# Patient Record
Sex: Male | Born: 1960 | Race: White | Hispanic: No | Marital: Married | Smoking: Current every day smoker
Health system: Southern US, Community
[De-identification: ages and names within clinical notes are randomized; demographics above are authoritative.]

## PROBLEM LIST (undated history)

## (undated) DIAGNOSIS — F419 Anxiety disorder, unspecified: Secondary | ICD-10-CM

## (undated) DIAGNOSIS — I5022 Chronic systolic (congestive) heart failure: Secondary | ICD-10-CM

## (undated) DIAGNOSIS — F32A Depression, unspecified: Secondary | ICD-10-CM

## (undated) DIAGNOSIS — I214 Non-ST elevation (NSTEMI) myocardial infarction: Secondary | ICD-10-CM

## (undated) DIAGNOSIS — I519 Heart disease, unspecified: Secondary | ICD-10-CM

## (undated) DIAGNOSIS — I10 Essential (primary) hypertension: Secondary | ICD-10-CM

## (undated) HISTORY — PX: CARDIAC CATHETERIZATION: SHX172

---

## 2007-01-14 ENCOUNTER — Emergency Department (HOSPITAL_COMMUNITY): Admission: EM | Admit: 2007-01-14 | Discharge: 2007-01-14 | Payer: Self-pay | Admitting: Emergency Medicine

## 2010-12-24 LAB — URINE MICROSCOPIC-ADD ON

## 2010-12-24 LAB — URINALYSIS, ROUTINE W REFLEX MICROSCOPIC
Bilirubin Urine: NEGATIVE
Glucose, UA: NEGATIVE
Ketones, ur: NEGATIVE
Nitrite: NEGATIVE
Protein, ur: NEGATIVE
Specific Gravity, Urine: 1.025
Urobilinogen, UA: 0.2
pH: 5.5

## 2010-12-24 LAB — DIFFERENTIAL
Basophils Absolute: 0.1
Basophils Relative: 1
Eosinophils Absolute: 0.3
Eosinophils Relative: 3
Lymphocytes Relative: 18
Lymphs Abs: 2
Monocytes Absolute: 0.9 — ABNORMAL HIGH
Monocytes Relative: 8
Neutro Abs: 8.1 — ABNORMAL HIGH
Neutrophils Relative %: 71

## 2010-12-24 LAB — BASIC METABOLIC PANEL WITH GFR
BUN: 14
Chloride: 107
Creatinine, Ser: 0.81
GFR calc non Af Amer: >60
Glucose, Bld: 99

## 2010-12-24 LAB — CBC
HCT: 48.1
Hemoglobin: 16.5
MCHC: 34.4
MCV: 87.3
Platelets: 338
RBC: 5.51
RDW: 13.4
WBC: 11.4 — ABNORMAL HIGH

## 2010-12-24 LAB — BASIC METABOLIC PANEL
CO2: 27
Calcium: 9.5
GFR calc Af Amer: >60
Potassium: 4.4
Sodium: 141

## 2021-08-23 ENCOUNTER — Encounter (HOSPITAL_COMMUNITY): Payer: Self-pay | Admitting: *Deleted

## 2021-08-23 ENCOUNTER — Emergency Department (HOSPITAL_COMMUNITY): Payer: No Typology Code available for payment source

## 2021-08-23 ENCOUNTER — Other Ambulatory Visit: Payer: Self-pay

## 2021-08-23 ENCOUNTER — Inpatient Hospital Stay (HOSPITAL_COMMUNITY)
Admission: EM | Admit: 2021-08-23 | Discharge: 2021-08-31 | DRG: 957 | Disposition: A | Discharge status: Rehabilitation Facility | Payer: No Typology Code available for payment source | Attending: Internal Medicine | Admitting: Internal Medicine

## 2021-08-23 ENCOUNTER — Inpatient Hospital Stay (HOSPITAL_COMMUNITY): Payer: Self-pay

## 2021-08-23 DIAGNOSIS — Z955 Presence of coronary angioplasty implant and graft: Secondary | ICD-10-CM | POA: Diagnosis not present

## 2021-08-23 DIAGNOSIS — Z9861 Coronary angioplasty status: Secondary | ICD-10-CM

## 2021-08-23 DIAGNOSIS — E669 Obesity, unspecified: Secondary | ICD-10-CM | POA: Diagnosis not present

## 2021-08-23 DIAGNOSIS — E861 Hypovolemia: Secondary | ICD-10-CM | POA: Diagnosis not present

## 2021-08-23 DIAGNOSIS — S3210XA Unspecified fracture of sacrum, initial encounter for closed fracture: Secondary | ICD-10-CM

## 2021-08-23 DIAGNOSIS — F17210 Nicotine dependence, cigarettes, uncomplicated: Secondary | ICD-10-CM | POA: Diagnosis present

## 2021-08-23 DIAGNOSIS — S3282XA Multiple fractures of pelvis without disruption of pelvic ring, initial encounter for closed fracture: Secondary | ICD-10-CM | POA: Diagnosis present

## 2021-08-23 DIAGNOSIS — I110 Hypertensive heart disease with heart failure: Secondary | ICD-10-CM | POA: Diagnosis present

## 2021-08-23 DIAGNOSIS — Y990 Civilian activity done for income or pay: Secondary | ICD-10-CM | POA: Diagnosis not present

## 2021-08-23 DIAGNOSIS — E785 Hyperlipidemia, unspecified: Secondary | ICD-10-CM | POA: Diagnosis present

## 2021-08-23 DIAGNOSIS — K5900 Constipation, unspecified: Secondary | ICD-10-CM | POA: Diagnosis not present

## 2021-08-23 DIAGNOSIS — J9601 Acute respiratory failure with hypoxia: Secondary | ICD-10-CM | POA: Diagnosis not present

## 2021-08-23 DIAGNOSIS — I25810 Atherosclerosis of coronary artery bypass graft(s) without angina pectoris: Secondary | ICD-10-CM | POA: Diagnosis not present

## 2021-08-23 DIAGNOSIS — I2510 Atherosclerotic heart disease of native coronary artery without angina pectoris: Secondary | ICD-10-CM | POA: Diagnosis present

## 2021-08-23 DIAGNOSIS — Z7982 Long term (current) use of aspirin: Secondary | ICD-10-CM | POA: Diagnosis not present

## 2021-08-23 DIAGNOSIS — E559 Vitamin D deficiency, unspecified: Secondary | ICD-10-CM

## 2021-08-23 DIAGNOSIS — I509 Heart failure, unspecified: Secondary | ICD-10-CM | POA: Diagnosis not present

## 2021-08-23 DIAGNOSIS — I5020 Unspecified systolic (congestive) heart failure: Secondary | ICD-10-CM

## 2021-08-23 DIAGNOSIS — Z23 Encounter for immunization: Secondary | ICD-10-CM

## 2021-08-23 DIAGNOSIS — S42492D Other displaced fracture of lower end of left humerus, subsequent encounter for fracture with routine healing: Secondary | ICD-10-CM | POA: Diagnosis not present

## 2021-08-23 DIAGNOSIS — S42402B Unspecified fracture of lower end of left humerus, initial encounter for open fracture: Secondary | ICD-10-CM | POA: Diagnosis present

## 2021-08-23 DIAGNOSIS — W11XXXA Fall on and from ladder, initial encounter: Secondary | ICD-10-CM | POA: Diagnosis present

## 2021-08-23 DIAGNOSIS — Z79899 Other long term (current) drug therapy: Secondary | ICD-10-CM | POA: Diagnosis not present

## 2021-08-23 DIAGNOSIS — S42402A Unspecified fracture of lower end of left humerus, initial encounter for closed fracture: Secondary | ICD-10-CM

## 2021-08-23 DIAGNOSIS — D72829 Elevated white blood cell count, unspecified: Secondary | ICD-10-CM | POA: Diagnosis present

## 2021-08-23 DIAGNOSIS — M25559 Pain in unspecified hip: Secondary | ICD-10-CM | POA: Diagnosis not present

## 2021-08-23 DIAGNOSIS — F419 Anxiety disorder, unspecified: Secondary | ICD-10-CM | POA: Diagnosis not present

## 2021-08-23 DIAGNOSIS — Z6841 Body Mass Index (BMI) 40.0 and over, adult: Secondary | ICD-10-CM

## 2021-08-23 DIAGNOSIS — E66813 Obesity, class 3: Secondary | ICD-10-CM | POA: Diagnosis present

## 2021-08-23 DIAGNOSIS — I252 Old myocardial infarction: Secondary | ICD-10-CM

## 2021-08-23 DIAGNOSIS — S46322A Laceration of muscle, fascia and tendon of triceps, left arm, initial encounter: Secondary | ICD-10-CM | POA: Diagnosis present

## 2021-08-23 DIAGNOSIS — E872 Acidosis, unspecified: Secondary | ICD-10-CM

## 2021-08-23 DIAGNOSIS — E1169 Type 2 diabetes mellitus with other specified complication: Secondary | ICD-10-CM | POA: Diagnosis not present

## 2021-08-23 DIAGNOSIS — I493 Ventricular premature depolarization: Secondary | ICD-10-CM | POA: Diagnosis present

## 2021-08-23 DIAGNOSIS — I10 Essential (primary) hypertension: Secondary | ICD-10-CM | POA: Diagnosis present

## 2021-08-23 DIAGNOSIS — I5023 Acute on chronic systolic (congestive) heart failure: Secondary | ICD-10-CM | POA: Diagnosis not present

## 2021-08-23 DIAGNOSIS — S3210XD Unspecified fracture of sacrum, subsequent encounter for fracture with routine healing: Secondary | ICD-10-CM | POA: Diagnosis not present

## 2021-08-23 DIAGNOSIS — D62 Acute posthemorrhagic anemia: Secondary | ICD-10-CM | POA: Diagnosis not present

## 2021-08-23 DIAGNOSIS — I5022 Chronic systolic (congestive) heart failure: Secondary | ICD-10-CM | POA: Diagnosis not present

## 2021-08-23 DIAGNOSIS — W19XXXA Unspecified fall, initial encounter: Principal | ICD-10-CM

## 2021-08-23 DIAGNOSIS — Z20822 Contact with and (suspected) exposure to covid-19: Secondary | ICD-10-CM | POA: Diagnosis present

## 2021-08-23 DIAGNOSIS — T1490XA Injury, unspecified, initial encounter: Secondary | ICD-10-CM | POA: Diagnosis not present

## 2021-08-23 DIAGNOSIS — S32599A Other specified fracture of unspecified pubis, initial encounter for closed fracture: Secondary | ICD-10-CM | POA: Diagnosis not present

## 2021-08-23 DIAGNOSIS — I9589 Other hypotension: Secondary | ICD-10-CM | POA: Diagnosis not present

## 2021-08-23 DIAGNOSIS — S32592D Other specified fracture of left pubis, subsequent encounter for fracture with routine healing: Secondary | ICD-10-CM | POA: Diagnosis not present

## 2021-08-23 DIAGNOSIS — S52124A Nondisplaced fracture of head of right radius, initial encounter for closed fracture: Secondary | ICD-10-CM | POA: Diagnosis present

## 2021-08-23 DIAGNOSIS — E119 Type 2 diabetes mellitus without complications: Secondary | ICD-10-CM | POA: Diagnosis present

## 2021-08-23 DIAGNOSIS — K5903 Drug induced constipation: Secondary | ICD-10-CM | POA: Diagnosis not present

## 2021-08-23 DIAGNOSIS — R7989 Other specified abnormal findings of blood chemistry: Secondary | ICD-10-CM | POA: Diagnosis not present

## 2021-08-23 HISTORY — DX: Non-ST elevation (NSTEMI) myocardial infarction: I21.4

## 2021-08-23 LAB — COMPREHENSIVE METABOLIC PANEL
ALT: 33 U/L (ref 0–44)
AST: 40 U/L (ref 15–41)
Albumin: 3.3 g/dL — ABNORMAL LOW (ref 3.5–5.0)
Alkaline Phosphatase: 49 U/L (ref 38–126)
Anion gap: 10 (ref 5–15)
BUN: 21 mg/dL (ref 8–23)
CO2: 22 mmol/L (ref 22–32)
Calcium: 8.7 mg/dL — ABNORMAL LOW (ref 8.9–10.3)
Chloride: 107 mmol/L (ref 98–111)
Creatinine, Ser: 0.81 mg/dL (ref 0.61–1.24)
GFR, Estimated: >60 mL/min (ref >60)
Glucose, Bld: 194 mg/dL — ABNORMAL HIGH (ref 70–99)
Potassium: 4.1 mmol/L (ref 3.5–5.1)
Sodium: 139 mmol/L (ref 135–145)
Total Bilirubin: 0.6 mg/dL (ref 0.3–1.2)
Total Protein: 5.8 g/dL — ABNORMAL LOW (ref 6.5–8.1)

## 2021-08-23 LAB — URINALYSIS, ROUTINE W REFLEX MICROSCOPIC
Bacteria, UA: NONE SEEN
Bilirubin Urine: NEGATIVE
Glucose, UA: NEGATIVE mg/dL
Ketones, ur: NEGATIVE mg/dL
Leukocytes,Ua: NEGATIVE
Nitrite: NEGATIVE
Protein, ur: 30 mg/dL — AB
Specific Gravity, Urine: >1.046 — ABNORMAL HIGH (ref 1.005–1.030)
pH: 5 (ref 5.0–8.0)

## 2021-08-23 LAB — CBC
HCT: 43.3 % (ref 39.0–52.0)
Hemoglobin: 14.7 g/dL (ref 13.0–17.0)
MCH: 32.1 pg (ref 26.0–34.0)
MCHC: 33.9 g/dL (ref 30.0–36.0)
MCV: 94.5 fL (ref 80.0–100.0)
Platelets: 193 10*3/uL (ref 150–400)
RBC: 4.58 MIL/uL (ref 4.22–5.81)
RDW: 12.6 % (ref 11.5–15.5)
WBC: 12.6 10*3/uL — ABNORMAL HIGH (ref 4.0–10.5)
nRBC: 0 % (ref 0.0–0.2)

## 2021-08-23 LAB — HIV ANTIBODY (ROUTINE TESTING W REFLEX): HIV Screen 4th Generation wRfx: NONREACTIVE

## 2021-08-23 LAB — HEMOGLOBIN A1C
Hgb A1c MFr Bld: 7.5 % — ABNORMAL HIGH (ref 4.8–5.6)
Mean Plasma Glucose: 168.55 mg/dL

## 2021-08-23 LAB — SAMPLE TO BLOOD BANK

## 2021-08-23 LAB — I-STAT CHEM 8, ED
BUN: 25 mg/dL — ABNORMAL HIGH (ref 8–23)
Calcium, Ion: 0.95 mmol/L — ABNORMAL LOW (ref 1.15–1.40)
Chloride: 107 mmol/L (ref 98–111)
Creatinine, Ser: 0.7 mg/dL (ref 0.61–1.24)
Glucose, Bld: 195 mg/dL — ABNORMAL HIGH (ref 70–99)
HCT: 42 % (ref 39.0–52.0)
Hemoglobin: 14.3 g/dL (ref 13.0–17.0)
Potassium: 4 mmol/L (ref 3.5–5.1)
Sodium: 138 mmol/L (ref 135–145)
TCO2: 23 mmol/L (ref 22–32)

## 2021-08-23 LAB — RESP PANEL BY RT-PCR (FLU A&B, COVID) ARPGX2
Influenza A by PCR: NEGATIVE
Influenza B by PCR: NEGATIVE
SARS Coronavirus 2 by RT PCR: NEGATIVE

## 2021-08-23 LAB — LACTIC ACID, PLASMA
Lactic Acid, Venous: 2 mmol/L (ref 0.5–1.9)
Lactic Acid, Venous: 1.6 mmol/L (ref 0.5–1.9)

## 2021-08-23 LAB — ETHANOL: Alcohol, Ethyl (B): <10 mg/dL (ref <10)

## 2021-08-23 LAB — PROTIME-INR
INR: 1 (ref 0.8–1.2)
Prothrombin Time: 12.9 seconds (ref 11.4–15.2)

## 2021-08-23 LAB — CK: Total CK: 252 U/L (ref 49–397)

## 2021-08-23 MED ORDER — HYDROMORPHONE HCL 1 MG/ML IJ SOLN
1.0000 mg | Freq: Once | INTRAMUSCULAR | Status: AC
  Administered 2021-08-23: 1 mg via INTRAVENOUS
  Filled 2021-08-23: qty 1

## 2021-08-23 MED ORDER — OXYCODONE-ACETAMINOPHEN 5-325 MG PO TABS
1.0000 | ORAL_TABLET | Freq: Four times a day (QID) | ORAL | Status: DC | PRN
  Administered 2021-08-23 – 2021-08-25 (×6): 2 via ORAL
  Administered 2021-08-25: 1 via ORAL
  Filled 2021-08-23 (×7): qty 2

## 2021-08-23 MED ORDER — SODIUM CHLORIDE 0.9% FLUSH
3.0000 mL | Freq: Two times a day (BID) | INTRAVENOUS | Status: DC
  Administered 2021-08-23 – 2021-08-30 (×11): 3 mL via INTRAVENOUS

## 2021-08-23 MED ORDER — HYDROMORPHONE HCL 1 MG/ML IJ SOLN
0.5000 mg | INTRAMUSCULAR | Status: DC | PRN
  Administered 2021-08-23: 0.5 mg via INTRAVENOUS
  Filled 2021-08-23: qty 0.5

## 2021-08-23 MED ORDER — ASPIRIN 325 MG PO TBEC
325.0000 mg | DELAYED_RELEASE_TABLET | Freq: Every day | ORAL | Status: DC
  Administered 2021-08-23 – 2021-08-30 (×8): 325 mg via ORAL
  Filled 2021-08-23 (×8): qty 1

## 2021-08-23 MED ORDER — CARVEDILOL 25 MG PO TABS
25.0000 mg | ORAL_TABLET | Freq: Two times a day (BID) | ORAL | Status: DC
  Administered 2021-08-23 – 2021-08-24 (×4): 25 mg via ORAL
  Filled 2021-08-23 (×2): qty 1
  Filled 2021-08-23 (×2): qty 2

## 2021-08-23 MED ORDER — SODIUM CHLORIDE 0.9 % IV BOLUS
1000.0000 mL | Freq: Once | INTRAVENOUS | Status: AC
  Administered 2021-08-23: 1000 mL via INTRAVENOUS

## 2021-08-23 MED ORDER — ALBUTEROL SULFATE (2.5 MG/3ML) 0.083% IN NEBU: 2.5000 mg | INHALATION_SOLUTION | RESPIRATORY_TRACT | Status: DC | PRN

## 2021-08-23 MED ORDER — SODIUM CHLORIDE 0.9 % IV SOLN: INTRAVENOUS | Status: DC

## 2021-08-23 MED ORDER — ALBUTEROL SULFATE (2.5 MG/3ML) 0.083% IN NEBU
2.5000 mg | INHALATION_SOLUTION | Freq: Four times a day (QID) | RESPIRATORY_TRACT | Status: DC
  Administered 2021-08-23 (×2): 2.5 mg via RESPIRATORY_TRACT
  Filled 2021-08-23 (×3): qty 3

## 2021-08-23 MED ORDER — FENTANYL CITRATE (PF) 100 MCG/2ML IJ SOLN
INTRAMUSCULAR | Status: AC
  Filled 2021-08-23: qty 2

## 2021-08-23 MED ORDER — ENOXAPARIN SODIUM 80 MG/0.8ML IJ SOSY
70.0000 mg | PREFILLED_SYRINGE | INTRAMUSCULAR | Status: DC
  Administered 2021-08-24: 70 mg via SUBCUTANEOUS
  Filled 2021-08-23 (×4): qty 0.7

## 2021-08-23 MED ORDER — HYDROCODONE-ACETAMINOPHEN 5-325 MG PO TABS
1.0000 | ORAL_TABLET | Freq: Four times a day (QID) | ORAL | Status: DC | PRN
  Administered 2021-08-23: 1 via ORAL
  Filled 2021-08-23: qty 1

## 2021-08-23 MED ORDER — ONDANSETRON HCL 4 MG/2ML IJ SOLN: 4.0000 mg | Freq: Four times a day (QID) | INTRAMUSCULAR | Status: DC | PRN

## 2021-08-23 MED ORDER — FENTANYL CITRATE PF 50 MCG/ML IJ SOSY: 50.0000 ug | PREFILLED_SYRINGE | Freq: Once | INTRAMUSCULAR | Status: DC

## 2021-08-23 MED ORDER — ACETAMINOPHEN 325 MG PO TABS
650.0000 mg | ORAL_TABLET | Freq: Four times a day (QID) | ORAL | Status: DC | PRN
  Administered 2021-08-24: 650 mg via ORAL
  Filled 2021-08-23: qty 2

## 2021-08-23 MED ORDER — ATORVASTATIN CALCIUM 40 MG PO TABS
40.0000 mg | ORAL_TABLET | Freq: Every evening | ORAL | Status: DC
  Administered 2021-08-23 – 2021-08-30 (×8): 40 mg via ORAL
  Filled 2021-08-23 (×7): qty 1

## 2021-08-23 MED ORDER — FENTANYL CITRATE PF 50 MCG/ML IJ SOSY
100.0000 ug | PREFILLED_SYRINGE | Freq: Once | INTRAMUSCULAR | Status: AC
  Administered 2021-08-23: 100 ug via INTRAVENOUS

## 2021-08-23 MED ORDER — ACETAMINOPHEN 650 MG RE SUPP: 650.0000 mg | Freq: Four times a day (QID) | RECTAL | Status: DC | PRN

## 2021-08-23 MED ORDER — HYDROMORPHONE HCL 1 MG/ML IJ SOLN: 1.0000 mg | Freq: Once | INTRAMUSCULAR | Status: DC

## 2021-08-23 MED ORDER — LISINOPRIL 20 MG PO TABS
40.0000 mg | ORAL_TABLET | Freq: Every evening | ORAL | Status: DC
  Administered 2021-08-24: 40 mg via ORAL
  Filled 2021-08-23 (×2): qty 2

## 2021-08-23 MED ORDER — ONDANSETRON HCL 4 MG PO TABS: 4.0000 mg | ORAL_TABLET | Freq: Four times a day (QID) | ORAL | Status: DC | PRN

## 2021-08-23 MED ORDER — IOHEXOL 300 MG/ML  SOLN
100.0000 mL | Freq: Once | INTRAMUSCULAR | Status: AC | PRN
  Administered 2021-08-23: 100 mL via INTRAVENOUS

## 2021-08-23 MED ORDER — TETANUS-DIPHTH-ACELL PERTUSSIS 5-2.5-18.5 LF-MCG/0.5 IM SUSY
0.5000 mL | PREFILLED_SYRINGE | Freq: Once | INTRAMUSCULAR | Status: AC
  Administered 2021-08-23: 0.5 mL via INTRAMUSCULAR
  Filled 2021-08-23: qty 0.5

## 2021-08-23 NOTE — ED Notes (Signed)
Dr. Eudelia Bunch informed of patient's Lactic Acid of 2.0 via epic secure chat.

## 2021-08-23 NOTE — H&P (Signed)
History and Physical    Patient: Mathew Oliver [id number] DOB: 09/19/1960 DOA: 08/23/2021 DOS: the patient was seen and examined on 08/23/2021 PCP: Pcp, No  Patient coming from: Home  Chief Complaint:  Chief Complaint  Patient presents with   Fall   HPI: Mathew Oliver is a 61 y.o. male with medical history significant of hypertension, hyperlipidemia, CAD, heart failure with reduced EF 40 -45% in 2021 who presents after falling from a 25 foot ladder.  He was trying to untangle totes at the time when he lost his balance, and fell possibly 18 -20 feet landing on his left side.  He denies losing consciousness or trauma to his head.  He  immediately endorsed pain out of his left elbow and left hip.  Any kind of movement worsens pain symptoms.  With EMS he was placed in a splint.  Upon admission to the emergency department patient was seen as a level 2 trauma.  Noted to be afebrile with mild tachypnea, and vital signs otherwise stable.  Labs significant for WBC 12.6, BUN 25, creatinine 0.7, and lactic acid 2.  CTs of the head, cervical spine, chest, abdomen, and pelvis were obtained noting concern for pelvic fractures including left superior, inferior pubic rami, and left sacrum urinalysis noted significantly elevated specific gravity.  X-rays of the left elbow noted a communicated fracture.  X-ray of the chest noted cardiomegaly with mild pulmonary vascular congestion without pneumothorax or pulmonary effusions.  Dr. Lynann Bologna of orthopedic surgery was consulted. Patient had been given IV pain medication and 1 L normal saline IV fluids.  Review of Systems: As mentioned in the history of present illness. All other systems reviewed and are negative. Past Medical History:  Diagnosis Date   MI, acute, non ST segment elevation ([place])    Past Surgical History:  Procedure Laterality Date   CARDIAC CATHETERIZATION     Social History:  reports that he has been smoking cigarettes. He does not have  any smokeless tobacco history on file. He reports current alcohol use. He reports that he does not use drugs.  No Known Allergies  No family history on file.  Prior to Admission medications   Medication Sig Start Date End Date Taking? Authorizing Provider  aspirin EC 325 MG tablet Take 325 mg by mouth at bedtime.   Yes [provider]  atorvastatin (LIPITOR) 40 MG tablet Take 40 mg by mouth every evening. 03/04/21  Yes [provider]  carvedilol (COREG) 25 MG tablet Take 25 mg by mouth 2 (two) times daily with a meal.   Yes [provider]  lisinopril (ZESTRIL) 40 MG tablet Take 40 mg by mouth every evening.   Yes [provider]  multivitamin (ONE-A-DAY MEN'S) TABS tablet Take 1 tablet by mouth daily.   Yes [provider]  naproxen sodium (ALEVE) 220 MG tablet Take 440 mg by mouth daily as needed (pain/headache).   Yes [provider]  spironolactone (ALDACTONE) 25 MG tablet Take 25 mg by mouth every evening.   Yes [provider]    Physical Exam: Vitals:   08/23/21 0315 08/23/21 0330 08/23/21 0430 08/23/21 0759  BP: 139/87 (!) 152/83 110/67 121/78  Pulse: 93 93 91 90  Resp: (!) 25 (!) 23 (!) 22 18  Temp:    (!) 97 F (36.1 C)  TempSrc:    Temporal  SpO2: 95% 96% 96% 97%  Weight:      Height:       Exam  Constitutional: Older obese male who appears to be in no acute distress at this time Eyes: PERRL, lids and conjunctivae normal ENMT: Mucous membranes are moist.   Neck: normal, supple, no JVD appreciated Respiratory: Normal respiratory effort without significant wheezes or rhonchi appreciated.  Patient currently on 2 L nasal cannula oxygen with O2 saturations maintained. Cardiovascular: Regular rate and rhythm, no murmurs / rubs / gallops. No extremity edema.   Abdomen: Protuberant abdomen with no tenderness, no masses palpated.  Bowel sounds positive.  Musculoskeletal: no clubbing / cyanosis.  Left elbow in  cast and sling. Skin: Bruising noted of the left chest Neurologic: CN 2-12 grossly intact.  Strength 5/5 in all 4.  Psychiatric: Normal judgment and insight. Alert and oriented x 3. Normal mood.   Data Reviewed:  EKG reveals sinus rhythm with multiple premature ventricular complexes.  Assessment and Plan: Left communication distal humerus, sacrum, and pubic rami fractures secondary to fall from ladder Patient presents after falling from a what he reports as a 25 foot ladder.  Patient sustained a communicated distal humerus fracture, left sacral fracture, and pubic ramus fractures on imaging.  Dr. Lynann Bologna orthopedics have been consulted and discussed case with Dr. Doreatha Martin he will evaluate for surgical intervention. -Admit to a medical telemetry bed -Incentive spirometry -Check CK -Weightbearing as tolerated for the sacral and pubic rami fractures.  Consider discussing need of binder patient unable to ambulate -Normal saline IV fluids at 75 mL/h x1 L. -Hydrocodone/Dilaudid IV as needed for moderate to severe pain -PT/OT to evaluate and treat -Transitions of care consulted -Orthopedics consulted, will follow-up for any further recommendations  Leukocytosis lactic acidosis Acute.  On admission WBC 12.6 with lactic acid 2.  White blood cell count elevated likely secondary to fracture.  No significant concern for infection.  Patient appears to be dehydrated due to elevated BUN to creatinine ratio as well as significantly elevated specific gravity on urinalysis which could be contributing to lactic acidosis. -IV fluids 75 mL/h -Recheck lactic acid level -Recheck CBC tomorrow morning  PVCs While in the ED patient was noted to have multiple PVCs.   -Follow-up telemetry overnight -Correct any electrolyte abnormalities  Heart failure with reduced EF Last echo from 08/2019 noted EF of 40-45% with normal diastolic function.  Patient does not appear acutely decompensated at this time.  Patient  had been scheduled for repeat echocardiogram. -Strict I&O's and daily weights -Check echocardiogram given height of fall, PVCs, and patient was already scheduled for repeat echocardiogram.   Essential hypertension Home medication regimen includes Coreg, 25 mg twice daily with meals, lisinopril 40 mg, and spironolactone 25 mg. -Continue Coreg and lisinopril -Held spironolactone initially as patient appeared to be dehydrated based off elevated specific gravity and elevated BUN to creatinine ratio.  Reassess in a.m. and determine when medically appropriate to resume  CAD s/p PCI Patient with prior history of MI who had a drug-eluting stent placed of the LAD in 2009 and has history of -Continue aspirin, statin, beta-blocker  Hyperlipidemia -Continue atorvastatin  Morbid obesity BMI 40.69 kg/m   Advance Care Planning:   Code Status: Full Code   Consults: Orthopedic  Family Communication: None requested  Severity of Illness: The appropriate patient status for this patient is INPATIENT. Inpatient status is judged to be reasonable and necessary in order to provide the required intensity of service to ensure the patient's safety. The patient's presenting symptoms, physical exam findings, and initial radiographic and laboratory data in the context of their chronic comorbidities  is felt to place them at high risk for further clinical deterioration. Furthermore, it is not anticipated that the patient will be medically stable for discharge from the hospital within 2 midnights of admission.   * I certify that at the point of admission it is my clinical judgment that the patient will require inpatient hospital care spanning beyond 2 midnights from the point of admission due to high intensity of service, high risk for further deterioration and high frequency of surveillance required.*  Author: Norval Morton, MD 08/23/2021 8:25 AM  For on call review [email or web address].

## 2021-08-23 NOTE — Progress Notes (Signed)
Orthopedic Tech Progress Note Patient Details:  Mathew Oliver [birth year] [id number]  Initial long arm splint was taken down by Jason Coop, PA-C to check pt's skin due to concern of it being an open fx. She confirmed it was not open and was okay with me reapplying the existing fiberglass splint as it was still in good condition.   I padded the splint well and reapplied to LUE. Also placed two ice packs on the L arm. Encouraged icing, movement of fingers, and elevation to help with swelling.   Ortho Devices Type of Ortho Device: Post (long arm) splint Ortho Device/Splint Location: LUE Ortho Device/Splint Interventions: Application (*reapplication of existing splint)   Post Interventions Patient Tolerated: Well Instructions Provided: Care of device  Mathew Oliver [name] 08/23/2021, 9:39 AM

## 2021-08-23 NOTE — Progress Notes (Signed)
Orthopedic Tech Progress Note Patient Details:  Mathew Oliver 10-11-1960 [id number]  Ortho Devices Type of Ortho Device: Long arm splint, Shoulder immobilizer Ortho Device/Splint Location: lue Ortho Device/Splint Interventions: Ordered, Application, Adjustment   Post Interventions Patient Tolerated: Well  [name] 08/23/2021, 4:24 AM

## 2021-08-23 NOTE — TOC Initial Note (Addendum)
Transition of Care [facility]) - Initial/Assessment Note    Patient Details  Name: Mathew Oliver MRN: [id number] Date of Birth: 1960/12/19  Transition of Care [facility]) CM/SW Contact:    Lockie Pares, RN Phone Number: 08/23/2021, 1:14 PM  Clinical Narrative:                  61 yo with a cardiac history of MI, due soon for echocardiogram, had a ladder at work slip out from underneath him, sustaining a fall. Marland Kitchen  He is currently admitted for Left humorous fx. He also has Pubic rami and sacral fractures.  Conception Oms from [employer] called and let this RNCM know the claim number [id number].He asked questions regarding patients status, will need patient permission prior to speaking to Mr. Yetta Barre. His claim  number is [id number].for [employer] He has been speaking to the patients wife.  Surgical intervention may be tomorrow or as late as Tuesday. Dr Jena Gauss has been consulted from a orthopedic standpoint.  1400 patient gave me verbal permission to talk to Conception Oms about his condition and "whatever he needs to know" Called Conception Oms., no answer 1413 Conception Oms called back let him know of injuries , surgical intervention, Likely DME and Home Health for assistance. He appreciated the information.  Cm will follow for needs, recommendations, and transitions. Barriers to Discharge: Continued Medical Work up   Patient Goals and CMS Choice        Expected Discharge Plan and Services  Home health DME   Discharge Planning Services: CM Consult   Living arrangements for the past 2 months: Single Family Home                                      Prior Living Arrangements/Services Living arrangements for the past 2 months: Single Family Home Lives with:: Spouse Patient language and need for interpreter reviewed:: Yes        Need for Family Participation in Patient Care: Yes (Comment) Care giver support system in place?: Yes (comment)   Criminal Activity/Legal  Involvement Pertinent to Current Situation/Hospitalization: No - Comment as needed  Activities of Daily Living      Permission Sought/Granted                  Emotional Assessment       Orientation: : Oriented to Self, Oriented to Place, Oriented to  Time Alcohol / Substance Use: Tobacco Use Psych Involvement: No (comment)  Admission diagnosis:  Closed fracture of left distal humerus [S42.402A] Patient Active Problem List   Diagnosis Date Noted   Closed fracture of left distal humerus 08/23/2021   PCP:  Pcp, No Pharmacy:   [id number] - [place], [place] - [address] [place] [address] Phone: [phone] Fax: [phone]     Social Determinants of Health (SDOH) Interventions    Readmission Risk Interventions     No data to display

## 2021-08-23 NOTE — ED Triage Notes (Signed)
Pt arrived as Level 2, fall ~18-61ft from a ladder onto his left side. Deformity to L elbow, abrasion to L ribcage. C-collar in place GCS 15. EMS IV to [facility]

## 2021-08-23 NOTE — ED Notes (Signed)
Ortho PA at bedside, removed splint, cleaned left elbow-- ortho tech replacing splint.

## 2021-08-23 NOTE — Plan of Care (Signed)
  Problem: Nutrition: Goal: Adequate nutrition will be maintained Outcome: Progressing   Problem: Safety: Goal: Ability to remain free from injury will improve Outcome: Progressing   Problem: Skin Integrity: Goal: Risk for impaired skin integrity will decrease Outcome: Progressing   

## 2021-08-23 NOTE — ED Provider Notes (Signed)
[facility] EMERGENCY DEPARTMENT Provider Note  CSN: [id number] Arrival date & time: 08/23/21 0054  Chief Complaint(s) Fall  HPI Mathew Oliver is a 61 y.o. male with a past medical history listed below including prior MI who presents to the emergency department as a level 2 trauma.  Patient fell from 18 to 20 feet above ground.  He was standing on a ladder at work when the ladder became unstable causing him to fall onto his left side.  He denied any loss of consciousness.  Denies any headache or neck pain.  Denies any back pain chest pain or abdominal pain.  He is endorsing mostly left elbow pain and left hip pain.  Pain is severe and worse with movement and palpation.  Patient is not anticoagulated.  He was brought in by EMS who stabilized the left elbow.  Patient was given IV fentanyl.  Patient's initial blood pressures were stable but dropped after fentanyl.  The history is provided by the patient.    Past Medical History Past Medical History:  Diagnosis Date   MI, acute, non ST segment elevation [facility])    Patient Active Problem List   Diagnosis Date Noted   Closed fracture of left distal humerus 08/23/2021   Home Medication(s) Prior to Admission medications   Medication Sig Start Date End Date Taking? Authorizing Provider  atorvastatin (LIPITOR) 40 MG tablet Take 40 mg by mouth daily. 03/04/21   [provider]                                                                                                                                    Allergies Patient has no known allergies.  Review of Systems Review of Systems As noted in HPI  Physical Exam Vital Signs  I have reviewed the triage vital signs BP 110/67   Pulse 91   Temp (!) 97.5 F (36.4 C) (Temporal)   Resp (!) 22   Ht 6' (1.829 m)   Wt 136.1 kg   SpO2 96%   BMI 40.69 kg/m   Physical Exam Constitutional:      General: He is not in acute distress.    Appearance: He is  well-developed. He is not diaphoretic.     Interventions: Cervical collar in place.  HENT:     Head: Normocephalic.     Right Ear: External ear normal.     Left Ear: External ear normal.  Eyes:     General: No scleral icterus.       Right eye: No discharge.        Left eye: No discharge.     Conjunctiva/sclera: Conjunctivae normal.     Pupils: Pupils are equal, round, and reactive to light.  Cardiovascular:     Rate and Rhythm: Regular rhythm.     Pulses:          Radial pulses are 2+ on the right side  and 2+ on the left side.       Dorsalis pedis pulses are 2+ on the right side and 2+ on the left side.     Heart sounds: Normal heart sounds. No murmur heard.    No friction rub. No gallop.  Pulmonary:     Effort: Pulmonary effort is normal. No respiratory distress.     Breath sounds: Normal breath sounds. No stridor.  Chest:    Abdominal:     General: There is no distension.     Palpations: Abdomen is soft.     Tenderness: There is no abdominal tenderness.  Musculoskeletal:     Left upper arm: No tenderness.     Left elbow: Swelling and deformity present. No lacerations. Decreased range of motion. Tenderness present.     Left forearm: No tenderness.     Cervical back: Normal range of motion and neck supple. No bony tenderness.     Thoracic back: No bony tenderness.     Lumbar back: No bony tenderness.     Right hip: Normal strength.     Left hip: Tenderness present. Normal range of motion. Normal strength.     Comments: Clavicle stable. Chest stable to AP/Lat compression. Pelvis stable to Lat compression. No chest or abdominal wall contusion.  Skin:    General: Skin is warm.  Neurological:     Mental Status: He is alert and oriented to person, place, and time.     GCS: GCS eye subscore is 4. GCS verbal subscore is 5. GCS motor subscore is 6.     Comments: Moving all extremities      ED Results and Treatments Labs (all labs ordered are listed, but only abnormal  results are displayed) Labs Reviewed  COMPREHENSIVE METABOLIC PANEL - Abnormal; Notable for the following components:      Result Value   Glucose, Bld 194 (*)    Calcium 8.7 (*)    Total Protein 5.8 (*)    Albumin 3.3 (*)    All other components within normal limits  CBC - Abnormal; Notable for the following components:   WBC 12.6 (*)    All other components within normal limits  LACTIC ACID, PLASMA - Abnormal; Notable for the following components:   Lactic Acid, Venous 2.0 (*)    All other components within normal limits  I-STAT CHEM 8, ED - Abnormal; Notable for the following components:   BUN 25 (*)    Glucose, Bld 195 (*)    Calcium, Ion 0.95 (*)    All other components within normal limits  RESP PANEL BY RT-PCR (FLU A&B, COVID) ARPGX2  ETHANOL  PROTIME-INR  URINALYSIS, ROUTINE W REFLEX MICROSCOPIC  SAMPLE TO BLOOD BANK                                                                                                                         EKG  EKG Interpretation  Date/Time:    Ventricular Rate:  PR Interval:    QRS Duration:   QT Interval:    QTC Calculation:   R Axis:     Text Interpretation:         Radiology DG Pelvis 1-2 Views  Result Date: 08/23/2021 CLINICAL DATA:  Fall EXAM: PELVIS - 1-2 VIEW COMPARISON:  CT earlier today. FINDINGS: Left pubic fracture noted extending into the left superior pubic ramus. Left inferior pubic ramus fracture also seen. The left sacral fracture seen on CT not as well visualized by plain film. Proximal femurs unremarkable. No dislocation. IMPRESSION: Left pubic bone, superior pubic ramus and inferior pubic ramus fractures. Electronically Signed   By: Charlett Nose M.D.   On: 08/23/2021 02:49   DG Humerus Left  Result Date: 08/23/2021 CLINICAL DATA:  Fall EXAM: LEFT HUMERUS - 2+ VIEW COMPARISON:  Left elbow series today FINDINGS: Distal left humeral fracture noted. Significantly displaced fracture fragments. No additional humeral  fracture. IMPRESSION: Comminuted, displaced distal left humeral fracture. Electronically Signed   By: Charlett Nose M.D.   On: 08/23/2021 02:49   DG Elbow Complete Left  Result Date: 08/23/2021 CLINICAL DATA:  Fall from ladder EXAM: LEFT ELBOW - COMPLETE 3+ VIEW COMPARISON:  None Available. FINDINGS: Comminuted displaced distal left humeral fracture. Marked displacement of fracture fragments with possible dislocation of the ulna relative to the distal humeral fragments. Study is limited due to patient condition. IMPRESSION: Comminuted, displaced distal left humeral fracture. Possible dislocation of the ulna relative to the distal humeral fragments. Electronically Signed   By: Charlett Nose M.D.   On: 08/23/2021 02:48   DG Forearm Left  Result Date: 08/23/2021 CLINICAL DATA:  Fall from ladder EXAM: LEFT FOREARM - 2 VIEW COMPARISON:  Left elbow series today FINDINGS: Distal left humeral fracture noted. See elbow series for further discussion. No forearm fracture. IMPRESSION: Distal left humeral fracture. See elbow series for further discussion. Electronically Signed   By: Charlett Nose M.D.   On: 08/23/2021 02:47   CT CHEST ABDOMEN PELVIS W CONTRAST  Result Date: 08/23/2021 CLINICAL DATA:  Polytrauma, blunt.  Fall from ladder. EXAM: CT CHEST, ABDOMEN, AND PELVIS WITH CONTRAST TECHNIQUE: Multidetector CT imaging of the chest, abdomen and pelvis was performed following the standard protocol during bolus administration of intravenous contrast. RADIATION DOSE REDUCTION: This exam was performed according to the departmental dose-optimization program which includes automated exposure control, adjustment of the mA and/or kV according to patient size and/or use of iterative reconstruction technique. CONTRAST:  OMNIPAQUE IOHEXOL 300 MG/ML  SOLN COMPARISON:  None Available. FINDINGS: CT CHEST FINDINGS Cardiovascular: Heart is normal size. Aorta is normal caliber. Coronary artery and aortic calcifications.  Mediastinum/Nodes: No mediastinal, hilar, or axillary adenopathy. Trachea and esophagus are unremarkable. Thyroid unremarkable. Lungs/Pleura: Minimal dependent atelectasis in the lower lobes. No effusions or pneumothorax. Musculoskeletal: Chest wall soft tissues are unremarkable. No acute bony abnormality. CT ABDOMEN PELVIS FINDINGS Hepatobiliary: No hepatic injury or perihepatic hematoma. Diffuse low-density throughout the liver compatible with fatty infiltration. No focal hepatic abnormality. Gallbladder is unremarkable. Pancreas: No focal abnormality or ductal dilatation. Spleen: No splenic injury or perisplenic hematoma. Adrenals/Urinary Tract: No adrenal hemorrhage or renal injury identified. Bladder is unremarkable. Bilateral renal cysts. No hydronephrosis. Stomach/Bowel: Normal appendix. Left colonic diverticulosis. No active diverticulitis. Stomach and small bowel unremarkable. Vascular/Lymphatic: Aortic atherosclerosis. No evidence of aneurysm or adenopathy. Reproductive: No visible focal abnormality. Other: No free fluid or free air. Musculoskeletal: Fractures through the left superior and inferior pubic rami. No proximal femoral fracture.  Fracture through the left sacral ala. IMPRESSION: Pelvic fractures including left superior and inferior pubic rami and left sacrum. Otherwise, no evidence of significant trauma or acute findings in the chest, abdomen or pelvis. Hepatic steatosis. Coronary artery disease, aortic atherosclerosis. Electronically Signed   By: Charlett NoseKevin  Dover M.D.   On: 08/23/2021 02:28   CT HEAD WO CONTRAST  Result Date: 08/23/2021 CLINICAL DATA:  Trauma. EXAM: CT HEAD WITHOUT CONTRAST CT CERVICAL SPINE WITHOUT CONTRAST TECHNIQUE: Multidetector CT imaging of the head and cervical spine was performed following the standard protocol without intravenous contrast. Multiplanar CT image reconstructions of the cervical spine were also generated. RADIATION DOSE REDUCTION: This exam was performed  according to the departmental dose-optimization program which includes automated exposure control, adjustment of the mA and/or kV according to patient size and/or use of iterative reconstruction technique. COMPARISON:  None Available. FINDINGS: CT HEAD FINDINGS Brain: The ventricles and sulci are appropriate size for the patient's age. The gray-white matter discrimination is preserved. There is no acute intracranial hemorrhage. No mass effect or midline shift. No extra-axial fluid collection. Vascular: No hyperdense vessel or unexpected calcification. Skull: Normal. Negative for fracture or focal lesion. Sinuses/Orbits: No acute finding. Other: None CT CERVICAL SPINE FINDINGS Alignment: No acute subluxation. Skull base and vertebrae: No acute fracture. Soft tissues and spinal canal: No prevertebral fluid or swelling. No visible canal hematoma. Disc levels: Multilevel degenerative changes with disc space narrowing and endplate irregularity and spurring/osteophyte. Upper chest: Negative. Other: Mild bilateral carotid bulb calcified plaques. IMPRESSION: 1. No acute intracranial abnormality. 2. No acute/traumatic cervical spine pathology. Multilevel degenerative changes. Electronically Signed   By: Elgie CollardArash  Radparvar M.D.   On: 08/23/2021 02:27   CT CERVICAL SPINE WO CONTRAST  Result Date: 08/23/2021 CLINICAL DATA:  Trauma. EXAM: CT HEAD WITHOUT CONTRAST CT CERVICAL SPINE WITHOUT CONTRAST TECHNIQUE: Multidetector CT imaging of the head and cervical spine was performed following the standard protocol without intravenous contrast. Multiplanar CT image reconstructions of the cervical spine were also generated. RADIATION DOSE REDUCTION: This exam was performed according to the departmental dose-optimization program which includes automated exposure control, adjustment of the mA and/or kV according to patient size and/or use of iterative reconstruction technique. COMPARISON:  None Available. FINDINGS: CT HEAD FINDINGS  Brain: The ventricles and sulci are appropriate size for the patient's age. The gray-white matter discrimination is preserved. There is no acute intracranial hemorrhage. No mass effect or midline shift. No extra-axial fluid collection. Vascular: No hyperdense vessel or unexpected calcification. Skull: Normal. Negative for fracture or focal lesion. Sinuses/Orbits: No acute finding. Other: None CT CERVICAL SPINE FINDINGS Alignment: No acute subluxation. Skull base and vertebrae: No acute fracture. Soft tissues and spinal canal: No prevertebral fluid or swelling. No visible canal hematoma. Disc levels: Multilevel degenerative changes with disc space narrowing and endplate irregularity and spurring/osteophyte. Upper chest: Negative. Other: Mild bilateral carotid bulb calcified plaques. IMPRESSION: 1. No acute intracranial abnormality. 2. No acute/traumatic cervical spine pathology. Multilevel degenerative changes. Electronically Signed   By: Elgie CollardArash  Radparvar M.D.   On: 08/23/2021 02:27   DG Chest Port 1 View  Result Date: 08/23/2021 CLINICAL DATA:  Fall EXAM: PORTABLE CHEST 1 VIEW COMPARISON:  None Available. FINDINGS: Evaluation is limited due to positioning and superimposition the trauma board. Shallow inspiration. No focal consolidation, pleural effusion, pneumothorax. Mild cardiomegaly with mild vascular congestion. There is apparent widening of the mediastinum which may be positional. Attention on CT recommended. No obvious acute osseous pathology. IMPRESSION: Mild cardiomegaly with mild central  vascular congestion. Electronically Signed   By: Elgie Collard M.D.   On: 08/23/2021 01:36    Pertinent labs & imaging results that were available during my care of the patient were reviewed by me and considered in my medical decision making (see MDM for details).  Medications Ordered in ED Medications  fentaNYL (SUBLIMAZE) 100 MCG/2ML injection (  Canceled Entry 08/23/21 0216)  HYDROmorphone (DILAUDID)  injection 1 mg (has no administration in time range)  iohexol (OMNIPAQUE) 300 MG/ML solution 100 mL (100 mLs Intravenous Contrast Given 08/23/21 0219)  sodium chloride 0.9 % bolus 1,000 mL (0 mLs Intravenous Stopped 08/23/21 0259)  fentaNYL (SUBLIMAZE) injection 100 mcg (100 mcg Intravenous Given by Other 08/23/21 0145)  HYDROmorphone (DILAUDID) injection 1 mg (1 mg Intravenous Given 08/23/21 0342)                                                                                                                                     Procedures .Splint Application  Date/Time: 08/23/2021 5:22 AM  Performed by: Nira Conn, MD Authorized by: Nira Conn, MD   Consent:    Consent obtained:  Verbal   Consent given by:  Patient   Risks discussed:  Discoloration, numbness, pain and swelling   Alternatives discussed:  No treatment Universal protocol:    Imaging studies available: yes     Patient identity confirmed:  Verbally with patient and arm band Pre-procedure details:    Distal neurologic exam:  Normal   Distal perfusion: distal pulses strong   Procedure details:    Location:  Elbow   Elbow location:  L elbow   Cast type:  Long arm   Supplies:  Fiberglass Post-procedure details:    Distal neurologic exam:  Unchanged   Distal perfusion: distal pulses strong     Procedure completion:  Tolerated   (including critical care time)  Medical Decision Making / ED Course    Complexity of Problem:  Co-morbidities/SDOH that complicate the patient evaluation/care: Noted above in HPI  Additional history obtained: EMS  Patient's presenting problem/concern, DDX, and MDM listed below: Level 2 trauma.  Fall from approximately 20 feet ABCs intact Secondary as above Given mechanism and distracting injuries, will obtain full trauma work-up  Hospitalization Considered:  Yes  Initial Intervention:  IV fluids and fentanyl    Complexity of Data:   Cardiac  Monitoring: The patient was maintained on a cardiac monitor.   I personally viewed and interpreted the cardiac monitored which showed an underlying rhythm of normal sinus rhythm with rates in the 80s with intermittent PVCs EKG confirmed without acute ischemic changes,   Laboratory Tests ordered listed below with my independent interpretation: CBC with mild leukocytosis.  No anemia. No significant electrolyte derangements or renal sufficiency.   Imaging Studies ordered listed below with my independent interpretation: Chest x-ray with cardiomegaly and mild pulmonary vascular congestion without evidence of pneumothorax or pleural effusions.  No obvious rib fractures. Plain film of the pelvis notable for likely pubic ramus fracture on the left. will better characterize with CT scan CT chest abdomen pelvis confirmed the pelvic fractures and also noted a sacral fracture X-ray of the left elbow notable for comminuted fracture.     ED Course:    Assessment, Add'l Intervention, and Reassessment: Fall from 20 feet Resulting in left elbow fracture/dislocation as well as pubic rami and sacral fracture. Splint applied No other injuries noted on work-up. Consulted Dr. Yevette Edwards from orthopedic surgery who requested patient have a CT scan of the left elbow for preop evaluation. I spoke with Dr. Loney Loh from the hospitalist service who agreed to admit patient for medical clearance    Final Clinical Impression(s) / ED Diagnoses Final diagnoses:  Fall, initial encounter  Closed fracture of left elbow, initial encounter           This chart was dictated using voice recognition software.  Despite best efforts to proofread,  errors can occur which can change the documentation meaning.    Nira Conn, MD 08/23/21 [phone]

## 2021-08-23 NOTE — Progress Notes (Signed)
Ortho Trauma Note  Consult received from Dr. Yevette Edwards. Patient will require ORIF of distal humerus. Will tentatively plan for surgery Monday 6/12 pending OR availability. Will recommend NPO after midnight Sunday night. Would recommend mobilization for pelvic fractures with therapy in interim.  Roby Lofts, MD [facility] [phone] (office) [email or web address]

## 2021-08-23 NOTE — Consult Note (Addendum)
Reason for Consult: L elbow px/fx Referring Physician: ED  Mathew Oliver is an 61 y.o. male.  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  This resulted in 18-20 foot fall landing on his left side.  He had immediate pain in his left elbow and left hip.  He was taken to the emergency department and found to have complex left distal humerus fracture as well as pubic rami and sacral fractures.  Orthopedics was consulted for the elbow.  Patient denies any numbness, tingling, change in temperature, or loss of sensation in the left arm or hand.  He confirms prominent pain about the left elbow increased with any movement.  A long-arm splint was placed early this morning and he has found some benefit with that.  He still is having some difficulty with pain control and is only written for 0.5 of Dilaudid when necessary which he states helps substantially but only lasted short period of time.  No history of elbow trauma.  He does have a cardiac history and was actually due to see his cardiologist for an echocardiogram on the 20th.  Denies diabetes positive for smoking.  He takes full strength aspirin daily.  Past Medical History:  Diagnosis Date   MI, acute, non ST segment elevation ([place])     Past Surgical History:  Procedure Laterality Date   CARDIAC CATHETERIZATION      No family history on file.  Social History:  reports that he has been smoking cigarettes. He does not have any smokeless tobacco history on file. He reports current alcohol use. He reports that he does not use drugs.  Allergies: No Known Allergies  Medications: Scheduled:  albuterol  2.5 mg Nebulization Q6H   aspirin EC  325 mg Oral QHS   atorvastatin  40 mg Oral QPM   carvedilol  25 mg Oral BID WC   enoxaparin (LOVENOX) injection  70 mg Subcutaneous Q24H   fentaNYL       lisinopril  40 mg Oral QPM   sodium chloride flush  3 mL Intravenous Q12H   Tdap  0.5 mL Intramuscular Once    Continuous:  sodium chloride     [id number] **OR** acetaminophen, fentaNYL, HYDROcodone-acetaminophen, HYDROmorphone (DILAUDID) injection, ondansetron **OR** ondansetron (ZOFRAN) IV  Results for orders placed or performed during the hospital encounter of 08/23/21 (from the past 48 hour(s))  Sample to Blood Bank     Status: None   Collection Time: 08/23/21  1:08 AM  Result Value Ref Range   Blood Bank Specimen SAMPLE AVAILABLE FOR TESTING    Sample Expiration      08/24/2021,2359 Performed at [facility], [address] [place], [place] [address]   Comprehensive metabolic panel     Status: Abnormal   Collection Time: 08/23/21  1:14 AM  Result Value Ref Range   Sodium 139 135 - 145 mmol/L   Potassium 4.1 3.5 - 5.1 mmol/L   Chloride 107 98 - 111 mmol/L   CO2 22 22 - 32 mmol/L   Glucose, Bld 194 (H) 70 - 99 mg/dL    Comment: Glucose reference range applies only to samples taken after fasting for at least 8 hours.   BUN 21 8 - 23 mg/dL   Creatinine, Ser 0.81 0.61 - 1.24 mg/dL   Calcium 8.7 (L) 8.9 - 10.3 mg/dL   Total Protein 5.8 (L) 6.5 - 8.1 g/dL   Albumin 3.3 (L) 3.5 - 5.0  g/dL   AST 40 15 - 41 U/L   ALT 33 0 - 44 U/L   Alkaline Phosphatase 49 38 - 126 U/L   Total Bilirubin 0.6 0.3 - 1.2 mg/dL   GFR, Estimated >60 >60 mL/min    Comment: (NOTE) Calculated using the CKD-EPI Creatinine Equation (2021)    Anion gap 10 5 - 15    Comment: Performed at [facility] [address] [place], [place] [address]  CBC     Status: Abnormal   Collection Time: 08/23/21  1:14 AM  Result Value Ref Range   WBC 12.6 (H) 4.0 - 10.5 K/uL   RBC 4.58 4.22 - 5.81 MIL/uL   Hemoglobin 14.7 13.0 - 17.0 g/dL   HCT 43.3 39.0 - 52.0 %   MCV 94.5 80.0 - 100.0 fL   MCH 32.1 26.0 - 34.0 pg   MCHC 33.9 30.0 - 36.0 g/dL   RDW 12.6 11.5 - 15.5 %   Platelets 193 150 - 400 K/uL   nRBC 0.0 0.0 - 0.2 %    Comment: Performed at [facility], [place] [address] [place], [place]  [address]  Ethanol     Status: None   Collection Time: 08/23/21  1:14 AM  Result Value Ref Range   Alcohol, Ethyl (B) <10 <10 mg/dL    Comment: (NOTE) Lowest detectable limit for serum alcohol is 10 mg/dL.  For medical purposes only. Performed at [facility], [place] [address] [place], [place] [address]   Lactic acid, plasma     Status: Abnormal   Collection Time: 08/23/21  1:14 AM  Result Value Ref Range   Lactic Acid, Venous 2.0 (HH) 0.5 - 1.9 mmol/L    Comment: CRITICAL RESULT CALLED TO, READ BACK BY AND VERIFIED WITH:  Dennie Maizes, RN, [phone], 08/23/21, EADEDOKUN Performed at [facility], [place] [address] [place], [place] [address]   Protime-INR     Status: None   Collection Time: 08/23/21  1:14 AM  Result Value Ref Range   Prothrombin Time 12.9 11.4 - 15.2 seconds   INR 1.0 0.8 - 1.2    Comment: (NOTE) INR goal varies based on device and disease states. Performed at [facility], [place] [address], [place], [place] [address]   I-Stat Chem 8, ED     Status: Abnormal   Collection Time: 08/23/21  1:22 AM  Result Value Ref Range   Sodium 138 135 - 145 mmol/L   Potassium 4.0 3.5 - 5.1 mmol/L   Chloride 107 98 - 111 mmol/L   BUN 25 (H) 8 - 23 mg/dL   Creatinine, Ser 0.70 0.61 - 1.24 mg/dL   Glucose, Bld 195 (H) 70 - 99 mg/dL    Comment: Glucose reference range applies only to samples taken after fasting for at least 8 hours.   Calcium, Ion 0.95 (L) 1.15 - 1.40 mmol/L   TCO2 23 22 - 32 mmol/L   Hemoglobin 14.3 13.0 - 17.0 g/dL   HCT 42.0 39.0 - 52.0 %  Resp Panel by RT-PCR (Flu A&B, Covid) Anterior Nasal Swab     Status: None   Collection Time: 08/23/21  1:30 AM   Specimen: Anterior Nasal Swab  Result Value Ref Range   SARS Coronavirus 2 by RT PCR NEGATIVE NEGATIVE    Comment: (NOTE) SARS-CoV-2 target nucleic acids are NOT DETECTED.  The SARS-CoV-2 RNA is generally detectable in upper respiratory specimens during the acute phase of infection.  The  lowest concentration of SARS-CoV-2 viral copies this assay can detect is 138 copies/mL. A negative result does not preclude SARS-Cov-2 infection and should not be used as the sole basis for treatment or other patient management decisions. A negative result may occur with  improper specimen collection/handling, submission of specimen other than nasopharyngeal swab, presence of viral mutation(s) within the areas targeted by this assay, and inadequate number of viral copies(<138 copies/mL). A negative result must be combined with clinical observations, patient history, and epidemiological information. The expected result is Negative.  Fact Sheet for Patients:  EntrepreneurPulse.com.au  Fact Sheet for Healthcare Providers:  IncredibleEmployment.be  This test is no t yet approved or cleared by the [place] FDA and  has been authorized for detection and/or diagnosis of SARS-CoV-2 by FDA under an Emergency Use Authorization (EUA). This EUA will remain  in effect (meaning this test can be used) for the duration of the COVID-19 declaration under Section 564(b)(1) of the Act, 21 U.S.C.section 360bbb-3(b)(1), unless the authorization is terminated  or revoked sooner.       Influenza A by PCR NEGATIVE NEGATIVE   Influenza B by PCR NEGATIVE NEGATIVE    Comment: (NOTE) The Xpert Xpress SARS-CoV-2/FLU/RSV plus assay is intended as an aid in the diagnosis of influenza from Nasopharyngeal swab specimens and should not be used as a sole basis for treatment. Nasal washings and aspirates are unacceptable for Xpert Xpress SARS-CoV-2/FLU/RSV testing.  Fact Sheet for Patients: EntrepreneurPulse.com.au  Fact Sheet for Healthcare Providers: IncredibleEmployment.be  This test is not yet approved or cleared by the [place] FDA and has been authorized for detection and/or diagnosis of SARS-CoV-2 by FDA under an Emergency  Use Authorization (EUA). This EUA will remain in effect (meaning this test can be used) for the duration of the COVID-19 declaration under Section 564(b)(1) of the Act, 21 U.S.C. section 360bbb-3(b)(1), unless the authorization is terminated or revoked.  Performed at [facility], [place] [address], [place], [place] [address]   Urinalysis, Routine w reflex microscopic     Status: Abnormal   Collection Time: 08/23/21  5:55 AM  Result Value Ref Range   Color, Urine YELLOW YELLOW   APPearance CLEAR CLEAR   Specific Gravity, Urine >1.046 (H) 1.005 - 1.030   pH 5.0 5.0 - 8.0   Glucose, UA NEGATIVE NEGATIVE mg/dL   Hgb urine dipstick SMALL (A) NEGATIVE   Bilirubin Urine NEGATIVE NEGATIVE   Ketones, ur NEGATIVE NEGATIVE mg/dL   Protein, ur 30 (A) NEGATIVE mg/dL   Nitrite NEGATIVE NEGATIVE   Leukocytes,Ua NEGATIVE NEGATIVE   RBC / HPF 6-10 0 - 5 RBC/hpf   WBC, UA 0-5 0 - 5 WBC/hpf   Bacteria, UA NONE SEEN NONE SEEN   Squamous Epithelial / LPF 0-5 0 - 5    Comment: Performed at [facility], [place] [address], [place], [place] [address]    CT Elbow Left Wo Contrast  Result Date: 08/23/2021 CLINICAL DATA:  Elbow trauma EXAM: CT OF THE UPPER LEFT EXTREMITY WITHOUT CONTRAST TECHNIQUE: Multidetector CT imaging of the upper left extremity was performed according to the standard protocol. RADIATION DOSE REDUCTION: This exam was performed according to the departmental dose-optimization program which includes automated exposure control, adjustment of the mA and/or kV according to patient size and/or use of iterative reconstruction technique. COMPARISON:  None Available. FINDINGS: Bones/Joint/Cartilage There is a comminuted, intra-articular fracture of the distal humerus, with transverse component through the supracondylar humerus and split of the  epicondyles. Nondisplaced radial head fracture which partially articulates with the displaced capitellum. There is malalignment of the ulnar trochlear  joint with medial displacement of the medial epicondyle/trochlea. The proximal radioulnar joint appears intact. There is a large joint effusion. Ligaments Suboptimally assessed by CT. Muscles and Tendons Edematous appearance of the elbow musculature. There is intramuscular gas present. Soft tissues Extensive soft tissue swelling and soft tissue gas. Probable hematoma along the elbow joint. IMPRESSION: Comminuted intra-articular fracture-dislocation at the elbow with severely displaced distal humerus fracture. There is a transverse supracondylar component with splitting of the epicondyles. Partial articulation of the radius with the capitellum. Ulnotrochlear malalignment with medially displaced medial epicondyle/trochlea fragment. Preserved proximal radioulnar articulation. Nondisplaced radial head fracture. Large joint effusion and probable hematoma along the elbow. Extensive soft tissue swelling and soft tissue gas suggesting this is an open fracture. Electronically Signed   By: Maurine Simmering M.D.   On: 08/23/2021 08:11   DG Pelvis 1-2 Views  Result Date: 08/23/2021 CLINICAL DATA:  Fall EXAM: PELVIS - 1-2 VIEW COMPARISON:  CT earlier today. FINDINGS: Left pubic fracture noted extending into the left superior pubic ramus. Left inferior pubic ramus fracture also seen. The left sacral fracture seen on CT not as well visualized by plain film. Proximal femurs unremarkable. No dislocation. IMPRESSION: Left pubic bone, superior pubic ramus and inferior pubic ramus fractures. Electronically Signed   By: Rolm Baptise M.D.   On: 08/23/2021 02:49   DG Humerus Left  Result Date: 08/23/2021 CLINICAL DATA:  Fall EXAM: LEFT HUMERUS - 2+ VIEW COMPARISON:  Left elbow series today FINDINGS: Distal left humeral fracture noted. Significantly displaced fracture fragments. No additional humeral fracture. IMPRESSION: Comminuted, displaced distal left humeral fracture. Electronically Signed   By: Rolm Baptise M.D.   On: 08/23/2021  02:49   DG Elbow Complete Left  Result Date: 08/23/2021 CLINICAL DATA:  Fall from ladder EXAM: LEFT ELBOW - COMPLETE 3+ VIEW COMPARISON:  None Available. FINDINGS: Comminuted displaced distal left humeral fracture. Marked displacement of fracture fragments with possible dislocation of the ulna relative to the distal humeral fragments. Study is limited due to patient condition. IMPRESSION: Comminuted, displaced distal left humeral fracture. Possible dislocation of the ulna relative to the distal humeral fragments. Electronically Signed   By: Rolm Baptise M.D.   On: 08/23/2021 02:48   DG Forearm Left  Result Date: 08/23/2021 CLINICAL DATA:  Fall from ladder EXAM: LEFT FOREARM - 2 VIEW COMPARISON:  Left elbow series today FINDINGS: Distal left humeral fracture noted. See elbow series for further discussion. No forearm fracture. IMPRESSION: Distal left humeral fracture. See elbow series for further discussion. Electronically Signed   By: Rolm Baptise M.D.   On: 08/23/2021 02:47   CT CHEST ABDOMEN PELVIS W CONTRAST  Result Date: 08/23/2021 CLINICAL DATA:  Polytrauma, blunt.  Fall from ladder. EXAM: CT CHEST, ABDOMEN, AND PELVIS WITH CONTRAST TECHNIQUE: Multidetector CT imaging of the chest, abdomen and pelvis was performed following the standard protocol during bolus administration of intravenous contrast. RADIATION DOSE REDUCTION: This exam was performed according to the departmental dose-optimization program which includes automated exposure control, adjustment of the mA and/or kV according to patient size and/or use of iterative reconstruction technique. CONTRAST:  134mL OMNIPAQUE IOHEXOL 300 MG/ML  SOLN COMPARISON:  None Available. FINDINGS: CT CHEST FINDINGS Cardiovascular: Heart is normal size. Aorta is normal caliber. Coronary artery and aortic calcifications. Mediastinum/Nodes: No mediastinal, hilar, or axillary adenopathy. Trachea and esophagus are unremarkable. Thyroid unremarkable. Lungs/Pleura:  Minimal dependent  atelectasis in the lower lobes. No effusions or pneumothorax. Musculoskeletal: Chest wall soft tissues are unremarkable. No acute bony abnormality. CT ABDOMEN PELVIS FINDINGS Hepatobiliary: No hepatic injury or perihepatic hematoma. Diffuse low-density throughout the liver compatible with fatty infiltration. No focal hepatic abnormality. Gallbladder is unremarkable. Pancreas: No focal abnormality or ductal dilatation. Spleen: No splenic injury or perisplenic hematoma. Adrenals/Urinary Tract: No adrenal hemorrhage or renal injury identified. Bladder is unremarkable. Bilateral renal cysts. No hydronephrosis. Stomach/Bowel: Normal appendix. Left colonic diverticulosis. No active diverticulitis. Stomach and small bowel unremarkable. Vascular/Lymphatic: Aortic atherosclerosis. No evidence of aneurysm or adenopathy. Reproductive: No visible focal abnormality. Other: No free fluid or free air. Musculoskeletal: Fractures through the left superior and inferior pubic rami. No proximal femoral fracture. Fracture through the left sacral ala. IMPRESSION: Pelvic fractures including left superior and inferior pubic rami and left sacrum. Otherwise, no evidence of significant trauma or acute findings in the chest, abdomen or pelvis. Hepatic steatosis. Coronary artery disease, aortic atherosclerosis. Electronically Signed   By: Rolm Baptise M.D.   On: 08/23/2021 02:28   CT HEAD WO CONTRAST  Result Date: 08/23/2021 CLINICAL DATA:  Trauma. EXAM: CT HEAD WITHOUT CONTRAST CT CERVICAL SPINE WITHOUT CONTRAST TECHNIQUE: Multidetector CT imaging of the head and cervical spine was performed following the standard protocol without intravenous contrast. Multiplanar CT image reconstructions of the cervical spine were also generated. RADIATION DOSE REDUCTION: This exam was performed according to the departmental dose-optimization program which includes automated exposure control, adjustment of the mA and/or kV according to  patient size and/or use of iterative reconstruction technique. COMPARISON:  None Available. FINDINGS: CT HEAD FINDINGS Brain: The ventricles and sulci are appropriate size for the patient's age. The gray-white matter discrimination is preserved. There is no acute intracranial hemorrhage. No mass effect or midline shift. No extra-axial fluid collection. Vascular: No hyperdense vessel or unexpected calcification. Skull: Normal. Negative for fracture or focal lesion. Sinuses/Orbits: No acute finding. Other: None CT CERVICAL SPINE FINDINGS Alignment: No acute subluxation. Skull base and vertebrae: No acute fracture. Soft tissues and spinal canal: No prevertebral fluid or swelling. No visible canal hematoma. Disc levels: Multilevel degenerative changes with disc space narrowing and endplate irregularity and spurring/osteophyte. Upper chest: Negative. Other: Mild bilateral carotid bulb calcified plaques. IMPRESSION: 1. No acute intracranial abnormality. 2. No acute/traumatic cervical spine pathology. Multilevel degenerative changes. Electronically Signed   By: Anner Crete M.D.   On: 08/23/2021 02:27   CT CERVICAL SPINE WO CONTRAST  Result Date: 08/23/2021 CLINICAL DATA:  Trauma. EXAM: CT HEAD WITHOUT CONTRAST CT CERVICAL SPINE WITHOUT CONTRAST TECHNIQUE: Multidetector CT imaging of the head and cervical spine was performed following the standard protocol without intravenous contrast. Multiplanar CT image reconstructions of the cervical spine were also generated. RADIATION DOSE REDUCTION: This exam was performed according to the departmental dose-optimization program which includes automated exposure control, adjustment of the mA and/or kV according to patient size and/or use of iterative reconstruction technique. COMPARISON:  None Available. FINDINGS: CT HEAD FINDINGS Brain: The ventricles and sulci are appropriate size for the patient's age. The gray-white matter discrimination is preserved. There is no acute  intracranial hemorrhage. No mass effect or midline shift. No extra-axial fluid collection. Vascular: No hyperdense vessel or unexpected calcification. Skull: Normal. Negative for fracture or focal lesion. Sinuses/Orbits: No acute finding. Other: None CT CERVICAL SPINE FINDINGS Alignment: No acute subluxation. Skull base and vertebrae: No acute fracture. Soft tissues and spinal canal: No prevertebral fluid or swelling. No visible canal hematoma. Disc levels:  Multilevel degenerative changes with disc space narrowing and endplate irregularity and spurring/osteophyte. Upper chest: Negative. Other: Mild bilateral carotid bulb calcified plaques. IMPRESSION: 1. No acute intracranial abnormality. 2. No acute/traumatic cervical spine pathology. Multilevel degenerative changes. Electronically Signed   By: Anner Crete M.D.   On: 08/23/2021 02:27   DG Chest Port 1 View  Result Date: 08/23/2021 CLINICAL DATA:  Fall EXAM: PORTABLE CHEST 1 VIEW COMPARISON:  None Available. FINDINGS: Evaluation is limited due to positioning and superimposition the trauma board. Shallow inspiration. No focal consolidation, pleural effusion, pneumothorax. Mild cardiomegaly with mild vascular congestion. There is apparent widening of the mediastinum which may be positional. Attention on CT recommended. No obvious acute osseous pathology. IMPRESSION: Mild cardiomegaly with mild central vascular congestion. Electronically Signed   By: Anner Crete M.D.   On: 08/23/2021 01:36    Review of Systems  Constitutional:  Negative for chills and fever.  HENT:  Negative for congestion, ear discharge and facial swelling.   Respiratory:  Negative for shortness of breath and wheezing.   Cardiovascular:  Negative for leg swelling.  Musculoskeletal:  Positive for arthralgias and joint swelling.  Skin:  Positive for wound.  Neurological:  Negative for dizziness, weakness, light-headedness and numbness.  Psychiatric/Behavioral:  Negative for  agitation, behavioral problems and confusion. The patient is not nervous/anxious and is not hyperactive.    Blood pressure 121/78, pulse 88, temperature (!) 97 F (36.1 C), temperature source Temporal, resp. rate 20, height 6' (1.829 m), weight 136.1 kg, SpO2 97 %. Physical Exam Constitutional:      Appearance: He is normal weight.  HENT:     Head: Normocephalic.     Nose: Nose normal.  Eyes:     Extraocular Movements: Extraocular movements intact.     Conjunctiva/sclera: Conjunctivae normal.     Pupils: Pupils are equal, round, and reactive to light.  Pulmonary:     Effort: Pulmonary effort is normal.  Abdominal:     Palpations: Abdomen is soft.  Musculoskeletal:     Cervical back: Neck supple.  Skin:    General: Skin is warm and dry.     Capillary Refill: Capillary refill takes less than 2 seconds.     Comments: Superficial abrasions about the left elbow are noted.  Of note, there is no ulceration or any suggestion of an open fracture.  Of his pain is noted with any motion about the left elbow.  Neurological:     General: No focal deficit present.     Mental Status: He is alert.     Comments: Sensation is fully intact   2+ DRP + BIL, pt resting comfortably in hospital bed wife and son at bedside   Assessment/Plan: Comminuted distal humerus fracture Left sacral fracture Pubic ramus fractures  This case was discussed with Dr. Lynann Bologna, who has discussed the patient's situation with Dr. Doreatha Martin.  The patient will be admitted to the medicine service.  Cardiac optimization will be obtained.  The patient will need operative intervention for his left elbow fracture.  This will be performed by Dr. Doreatha Martin either on Sunday, Monday, or Wednesday.  He is aware of the patient's fracture, and has reviewed the images, and he will be in touch with the patient with regards to the precise timing of operative intervention. If Dr. Doreatha Martin is to proceed with surgery tomorrow, he will ensure the  patient is made NPO after midnight tonight.  From the standpoint of the patient's left sacral fracture, and pubic rami fractures,  these are nonoperative, and the patient can weight-bear as tolerated on the right and left legs.  Lennie Muckle Rhiley Solem 08/23/2021, 9:26 AM

## 2021-08-24 ENCOUNTER — Other Ambulatory Visit (HOSPITAL_COMMUNITY): Payer: Self-pay

## 2021-08-24 ENCOUNTER — Inpatient Hospital Stay (HOSPITAL_COMMUNITY): Payer: Self-pay

## 2021-08-24 DIAGNOSIS — S42402A Unspecified fracture of lower end of left humerus, initial encounter for closed fracture: Secondary | ICD-10-CM | POA: Diagnosis not present

## 2021-08-24 DIAGNOSIS — W11XXXA Fall on and from ladder, initial encounter: Secondary | ICD-10-CM | POA: Diagnosis not present

## 2021-08-24 DIAGNOSIS — I10 Essential (primary) hypertension: Secondary | ICD-10-CM | POA: Diagnosis not present

## 2021-08-24 DIAGNOSIS — I2510 Atherosclerotic heart disease of native coronary artery without angina pectoris: Secondary | ICD-10-CM | POA: Diagnosis not present

## 2021-08-24 DIAGNOSIS — R9431 Abnormal electrocardiogram [ECG] [EKG]: Secondary | ICD-10-CM

## 2021-08-24 LAB — BASIC METABOLIC PANEL
Anion gap: 6 (ref 5–15)
BUN: 15 mg/dL (ref 8–23)
CO2: 26 mmol/L (ref 22–32)
Calcium: 8.2 mg/dL — ABNORMAL LOW (ref 8.9–10.3)
Chloride: 104 mmol/L (ref 98–111)
Creatinine, Ser: 0.79 mg/dL (ref 0.61–1.24)
GFR, Estimated: >60 mL/min (ref >60)
Glucose, Bld: 183 mg/dL — ABNORMAL HIGH (ref 70–99)
Potassium: 3.9 mmol/L (ref 3.5–5.1)
Sodium: 136 mmol/L (ref 135–145)

## 2021-08-24 LAB — COMPREHENSIVE METABOLIC PANEL
ALT: 20 U/L (ref 0–44)
AST: 18 U/L (ref 15–41)
Albumin: 2.8 g/dL — ABNORMAL LOW (ref 3.5–5.0)
Alkaline Phosphatase: 44 U/L (ref 38–126)
Anion gap: 8 (ref 5–15)
BUN: 20 mg/dL (ref 8–23)
CO2: 28 mmol/L (ref 22–32)
Calcium: 8.3 mg/dL — ABNORMAL LOW (ref 8.9–10.3)
Chloride: 100 mmol/L (ref 98–111)
Creatinine, Ser: 1.01 mg/dL (ref 0.61–1.24)
GFR, Estimated: >60 mL/min (ref >60)
Glucose, Bld: 164 mg/dL — ABNORMAL HIGH (ref 70–99)
Potassium: 3.9 mmol/L (ref 3.5–5.1)
Sodium: 136 mmol/L (ref 135–145)
Total Bilirubin: 0.9 mg/dL (ref 0.3–1.2)
Total Protein: 5.5 g/dL — ABNORMAL LOW (ref 6.5–8.1)

## 2021-08-24 LAB — CBC
HCT: 39.7 % (ref 39.0–52.0)
HCT: 36.6 % — ABNORMAL LOW (ref 39.0–52.0)
Hemoglobin: 13.2 g/dL (ref 13.0–17.0)
Hemoglobin: 12.7 g/dL — ABNORMAL LOW (ref 13.0–17.0)
MCH: 31.4 pg (ref 26.0–34.0)
MCH: 32.6 pg (ref 26.0–34.0)
MCHC: 33.2 g/dL (ref 30.0–36.0)
MCHC: 34.7 g/dL (ref 30.0–36.0)
MCV: 94.3 fL (ref 80.0–100.0)
MCV: 94.1 fL (ref 80.0–100.0)
Platelets: 170 10*3/uL (ref 150–400)
Platelets: 169 10*3/uL (ref 150–400)
RBC: 4.21 MIL/uL — ABNORMAL LOW (ref 4.22–5.81)
RBC: 3.89 MIL/uL — ABNORMAL LOW (ref 4.22–5.81)
RDW: 12.7 % (ref 11.5–15.5)
RDW: 12.4 % (ref 11.5–15.5)
WBC: 12.5 10*3/uL — ABNORMAL HIGH (ref 4.0–10.5)
WBC: 12.5 10*3/uL — ABNORMAL HIGH (ref 4.0–10.5)
nRBC: 0 % (ref 0.0–0.2)
nRBC: 0 % (ref 0.0–0.2)

## 2021-08-24 LAB — ECHOCARDIOGRAM COMPLETE
AR max vel: 2.15 cm2
AV Area VTI: 2.24 cm2
AV Area mean vel: 2.02 cm2
AV Mean grad: 4 mmHg
AV Peak grad: 6.5 mmHg
Ao pk vel: 1.27 m/s
Area-P 1/2: 4.06 cm2
Height: 72 in
S' Lateral: 3.99 cm
Weight: 4927.72 oz

## 2021-08-24 LAB — LACTIC ACID, PLASMA: Lactic Acid, Venous: 1.2 mmol/L (ref 0.5–1.9)

## 2021-08-24 MED ORDER — CARVEDILOL 12.5 MG PO TABS: 12.5000 mg | ORAL_TABLET | Freq: Two times a day (BID) | ORAL | Status: DC

## 2021-08-24 MED ORDER — CEFAZOLIN SODIUM-DEXTROSE 2-4 GM/100ML-% IV SOLN
2.0000 g | Freq: Three times a day (TID) | INTRAVENOUS | Status: DC
  Administered 2021-08-24 – 2021-08-25 (×3): 2 g via INTRAVENOUS
  Filled 2021-08-24 (×4): qty 100

## 2021-08-24 MED ORDER — PERFLUTREN LIPID MICROSPHERE
1.0000 mL | INTRAVENOUS | Status: AC | PRN
  Administered 2021-08-24: 2 mL via INTRAVENOUS

## 2021-08-24 MED ORDER — LACTATED RINGERS IV BOLUS
500.0000 mL | Freq: Once | INTRAVENOUS | Status: AC
  Administered 2021-08-24: 500 mL via INTRAVENOUS

## 2021-08-24 MED ORDER — FUROSEMIDE 10 MG/ML IJ SOLN
40.0000 mg | Freq: Once | INTRAMUSCULAR | Status: AC
  Administered 2021-08-24: 40 mg via INTRAVENOUS
  Filled 2021-08-24: qty 4

## 2021-08-24 NOTE — Progress Notes (Signed)
Inpatient Rehab Admissions Coordinator:  ? ?Per therapy recommendations,  patient was screened for CIR candidacy by Vivienne Sangiovanni, MS, CCC-SLP. At this time, Pt. Appears to be a a potential candidate for CIR. I will place   order for rehab consult per protocol for full assessment. Please contact me any with questions. ? ?Jasmine Maceachern, MS, CCC-SLP ?Rehab Admissions Coordinator  ?[phone] (celll) ?[phone] (office) ? ?

## 2021-08-24 NOTE — H&P (View-Only) (Signed)
Orthopaedic Trauma Service (OTS) Consult   Patient ID: Mathew Oliver MRN: [id number] DOB/AGE: 62-28-1962 62 y.o.  Reason for Consult: Left distal humerus fracture, pelvic fractures Referring Physician: Dr. Phylliss Bob, MD ([facility])  HPI: Mathew Oliver is an 61 y.o. male with medical history significant of hypertension, hyperlipidemia, CAD, heart failure with reduced EF 40-45% being seen in consultation at request of Dr. Lynann Bologna for evaluation of pelvic fractures and left distal humerus fracture after sustaining a fall from a 25 foot ladder yesterday.  Patient  had immediate pain in his left elbow and left hip.  He was taken to the emergency department and found to have complex left distal humerus fracture as well as pubic rami and sacral fractures.  Orthopedics was consulted for evaluation and management.  It was felt that patient could be weightbearing as tolerated on bilateral lower extremities for his pelvic injuries.  Due to the complex nature of the left elbow injury, Dr. Lynann Bologna felt this was outside of the scope of practice and asked orthopedic trauma service to assume care of patient for definitive fixation.  Patient seen this morning on 5N.  Denies any numbness or tingling throughout the arm.  Pain has been well controlled.  No previous injury or surgery to the left upper extremity.  Does have cardiac history and was scheduled to see his cardiologist on the 20th of this month.  He had a cardiac stent placed around 2009.  Denies history of diabetes.  Does smoke regularly.  Takes aspirin 325 mg daily.  Works as a Engineer, building services.  Is right hand dominant. Ambulates at baseline with no assistive device.  Past Medical History:  Diagnosis Date   MI, acute, non ST segment elevation ([place])     Past Surgical History:  Procedure Laterality Date   CARDIAC CATHETERIZATION      No family history on file.  Social History:  reports that he has been smoking cigarettes. He does  not have any smokeless tobacco history on file. He reports current alcohol use. He reports that he does not use drugs.  Allergies: No Known Allergies  Medications: I have reviewed the patient's current medications. Prior to Admission:  Medications Prior to Admission  Medication Sig Dispense Refill Last Dose   aspirin EC 325 MG tablet Take 325 mg by mouth at bedtime.   08/22/2021   atorvastatin (LIPITOR) 40 MG tablet Take 40 mg by mouth every evening.   08/22/2021   carvedilol (COREG) 25 MG tablet Take 25 mg by mouth 2 (two) times daily with a meal.   08/22/2021 at 1700   lisinopril (ZESTRIL) 40 MG tablet Take 40 mg by mouth every evening.   08/22/2021   multivitamin (ONE-A-DAY MEN'S) TABS tablet Take 1 tablet by mouth daily.   08/22/2021   naproxen sodium (ALEVE) 220 MG tablet Take 440 mg by mouth daily as needed (pain/headache).   Past Week   spironolactone (ALDACTONE) 25 MG tablet Take 25 mg by mouth every evening.   08/22/2021    ROS: Constitutional: No fever or chills Vision: No changes in vision ENT: No difficulty swallowing CV: No chest pain Pulm: No SOB or wheezing GI: No nausea or vomiting GU: No urgency or inability to hold urine Skin: No poor wound healing Neurologic: No numbness or tingling Psychiatric: No depression or anxiety Heme: No bruising Allergic: No reaction to medications or food   Exam: Blood pressure 121/76, pulse 88, temperature 98.6 F (37 C), temperature source Oral, resp. rate 20,  height 6' (1.829 m), weight 136.1 kg, SpO2 96 %. General: No acute distress, laying in bed Orientation: Alert and oriented x3 Mood and Affect: Mood and affect appropriate, pleasant and cooperative Gait: Limited secondary to pelvic fractures Coordination and balance: Within normal limits  LUE: Long-arm splint in place.  Nontender above splint.  Able to wiggle fingers.  Motor function intact to the median, ulnar, radial nerve distribution.  Endorses sensation to light touch throughout  all aspects of the hand.  Hand warm and well-perfused, brisk cap refill  RUE: Skin without lesions. No tenderness to palpation. Full painless ROM, full strength in each muscle groups without evidence of instability.   Medical Decision Making: Data: Imaging: CT scan left elbow shows comminuted intra-articular fracture of the distal humerus.  Labs:  Results for orders placed or performed during the hospital encounter of 08/23/21 (from the past 24 hour(s))  HIV Antibody (routine testing w rflx)     Status: None   Collection Time: 08/23/21  5:14 PM  Result Value Ref Range   HIV Screen 4th Generation wRfx Non Reactive Non Reactive  CK     Status: None   Collection Time: 08/23/21  5:14 PM  Result Value Ref Range   Total CK 252 49 - 397 U/L  Lactic acid, plasma     Status: None   Collection Time: 08/23/21  5:14 PM  Result Value Ref Range   Lactic Acid, Venous 1.6 0.5 - 1.9 mmol/L  Hemoglobin A1c     Status: Abnormal   Collection Time: 08/23/21  6:53 PM  Result Value Ref Range   Hgb A1c MFr Bld 7.5 (H) 4.8 - 5.6 %   Mean Plasma Glucose 168.55 mg/dL  CBC     Status: Abnormal   Collection Time: 08/24/21  2:39 AM  Result Value Ref Range   WBC 12.5 (H) 4.0 - 10.5 K/uL   RBC 4.21 (L) 4.22 - 5.81 MIL/uL   Hemoglobin 13.2 13.0 - 17.0 g/dL   HCT 39.7 39.0 - 52.0 %   MCV 94.3 80.0 - 100.0 fL   MCH 31.4 26.0 - 34.0 pg   MCHC 33.2 30.0 - 36.0 g/dL   RDW 12.7 11.5 - 15.5 %   Platelets 170 150 - 400 K/uL   nRBC 0.0 0.0 - 0.2 %  Basic metabolic panel     Status: Abnormal   Collection Time: 08/24/21  2:39 AM  Result Value Ref Range   Sodium 136 135 - 145 mmol/L   Potassium 3.9 3.5 - 5.1 mmol/L   Chloride 104 98 - 111 mmol/L   CO2 26 22 - 32 mmol/L   Glucose, Bld 183 (H) 70 - 99 mg/dL   BUN 15 8 - 23 mg/dL   Creatinine, Ser 0.79 0.61 - 1.24 mg/dL   Calcium 8.2 (L) 8.9 - 10.3 mg/dL   GFR, Estimated >60 >60 mL/min   Anion gap 6 5 - 15     Assessment/Plan: 61 year old male status post  fall from ladder, resulting in left distal humerus fracture multiple pelvic fractures.  We will plan to continue treating pelvic fractures nonoperatively.  He may be weightbearing as tolerated bilateral lower extremities.  In terms of the left upper extremity, patient with significant injury which require surgical intervention.  He is currently comfortable in his long-arm splint.  We will plan to proceed with open reduction internal fixation of left distal humerus tomorrow (08/25/2021.  Risk and benefits of procedure were discussed with the patient and his wife  at bedside. Risks discussed included bleeding, infection, malunion, nonunion, damage to surrounding nerves and blood vessels, pain, hardware prominence or irritation, hardware failure, elbow stiffness, post-traumatic arthritis, DVT/PE, compartment syndrome, and even anesthesia complications.  Patient states understanding of these risk and agrees to proceed with surgery.  Consent will be obtained.  Please keep patient n.p.o. after midnight  Gwinda Passe PA-C [facility] [phone] (office) [email or web address]

## 2021-08-24 NOTE — Plan of Care (Signed)
Cross coverage note  Patient had multiple episodes of watery bowel movements overnight  GI panel and stool for C. difficile ordered. Enteric precautions

## 2021-08-24 NOTE — Progress Notes (Signed)
[facility] notified that bp 90/50 manual

## 2021-08-24 NOTE — Progress Notes (Signed)
PHARMACY ANTIBIOTIC CONSULT NOTE   Mathew Oliver a 61 y.o. male with concern for open fracture on CT .  Pharmacy has been consulted for cefazolin dosing.  08/24/2021: Scr 0.79, LA 1.6 (6/10),  WBC 12.5  Vital Signs Stable   Estimated Creatinine Clearance: 140.4 mL/min (by C-G formula based on SCr of 0.79 mg/dL).  Plan: START cefazolin 2g IV Q8h  F/u abx DOT, clinical status  Allergies:  No Known Allergies  Filed Weights   08/23/21 0100 08/24/21 0845  Weight: 136.1 kg (300 lb) (!) 139.7 kg (307 lb 15.7 oz)       Latest Ref Rng & Units 08/24/2021    2:39 AM 08/23/2021    1:22 AM 08/23/2021    1:14 AM  CBC  WBC 4.0 - 10.5 K/uL 12.5   12.6   Hemoglobin 13.0 - 17.0 g/dL 13.2  14.3  14.7   Hematocrit 39.0 - 52.0 % 39.7  42.0  43.3   Platelets 150 - 400 K/uL 170   193     Antibiotics Given (last 72 hours)     None       Antimicrobials this admission: cefazolin 08/24/2021>>   Microbiology results: Pending   Thank you for allowing pharmacy to be a part of this patient's care.  Adria Dill, PharmD PGY-1 Acute Care Resident  08/24/2021 10:13 AM

## 2021-08-24 NOTE — Plan of Care (Signed)

## 2021-08-24 NOTE — Progress Notes (Signed)
Patient wife stated that she will sign consent 6/12 placed in chart . Surgical PCR done

## 2021-08-24 NOTE — Evaluation (Signed)
Physical Therapy Evaluation Patient Details Name: Mathew Oliver MRN: [id number] DOB: 02/06/1961 Today's Date: 08/24/2021  History of Present Illness  61 yo male presenting 6/10 after sustaining a fall off of a ladder with pelvic fractures including left superior, inferior pubic rami, and left sacrum. Pt also with comminuted L distal humerus fx, awaiting ORIF 6/12. PMH includes: tobacco use, MI.   Clinical Impression  Pt in bed upon arrival of PT, agreeable to evaluation at this time. Prior to admission the pt was completely independent, working in maintenance, living with his spouse in a home with 2 steps to enter. The pt now presents with limitations in functional mobility, strength, ROM, stability, and activity tolerance due to above dx and resulting pain, and will continue to benefit from skilled PT to address these deficits. The pt required max-totalA to complete all bed mobility due to pain with movement of LE and poor functional strength in core to complete movement of his trunk. The pt was then able to tolerate static sitting EOB, but required leaning to R to off weight L hip/pelvis due to pain. With significant elevation of bed, the pt was able to stand briefly with minA of 2, but was unable to tolerate for > 5-10 seconds. Will continue to benefit from skilled PT acutely as well as following d/c to facilitate maximal recovery and return to full independence.         Recommendations for follow up therapy are one component of a multi-disciplinary discharge planning process, led by the attending physician.  Recommendations may be updated based on patient status, additional functional criteria and insurance authorization.  Follow Up Recommendations Acute inpatient rehab (3hours/day)    Assistance Recommended at Discharge Frequent or constant Supervision/Assistance  Patient can return home with the following  Two people to help with walking and/or transfers;Two people to help with  bathing/dressing/bathroom;Assistance with cooking/housework;Assistance with feeding;Direct supervision/assist for medications management;Direct supervision/assist for financial management;Assist for transportation;Help with stairs or ramp for entrance    Equipment Recommendations Wheelchair (measurements PT);Wheelchair cushion (measurements PT)  Recommendations for Other Services  Rehab consult    Functional Status Assessment Patient has had a recent decline in their functional status and demonstrates the ability to make significant improvements in function in a reasonable and predictable amount of time.     Precautions / Restrictions Precautions Precautions: Fall Required Braces or Orthoses: Sling (L UE) Restrictions Weight Bearing Restrictions: Yes LUE Weight Bearing: Non weight bearing      Mobility  Bed Mobility Overal bed mobility: Needs Assistance Bed Mobility: Supine to Sit, Sit to Supine     Supine to sit: Max assist, +2 for physical assistance Sit to supine: Total assist, +2 for physical assistance   General bed mobility comments: pt attempting with max encouragement to move LE, unable to effectively manage trunk despite use of core and RUE. max-totalA to complete transfer and pt needing RUE support on bed rail to steady in sitting    Transfers Overall transfer level: Needs assistance Equipment used:  (RUE supported on recliner) Transfers: Sit to/from Stand Sit to Stand: From elevated surface, Min assist, +2 physical assistance           General transfer comment: bed elevated significantly, pt then able to maintain static stance for 5-10 seconds, limited by pain and fatigue.    Ambulation/Gait               General Gait Details: pt unable     Balance Overall balance assessment: Needs  assistance Sitting-balance support: Single extremity supported, Feet supported Sitting balance-Leahy Scale: Fair Sitting balance - Comments: strong R lean to offweight  L pelvis Postural control: Right lateral lean Standing balance support: Single extremity supported Standing balance-Leahy Scale: Poor Standing balance comment: dependent on RUE support, unable to tolerate for > 10 seconds at a time                             Pertinent Vitals/Pain Pain Assessment Pain Assessment: 0-10 Pain Score: 7  Pain Location: elbow, hip Pain Descriptors / Indicators: Stabbing Pain Intervention(s): Limited activity within patient's tolerance, Monitored during session, Premedicated before session, Repositioned    Home Living Family/patient expects to be discharged to:: Private residence Living Arrangements: Spouse/significant other Available Help at Discharge: Family;Available 24 hours/day Type of Home: Mobile home Home Access: Stairs to enter Entrance Stairs-Rails: None Entrance Stairs-Number of Steps: 1-2   Home Layout: One level Home Equipment: Grab bars - tub/shower Additional Comments: wife can (A)    Prior Function Prior Level of Function : Independent/Modified Independent;Working/employed;Driving             Mobility Comments: pt works Museum/gallery curator, no falls, no mobility issues       Hand Dominance   Dominant Hand: Right    Extremity/Trunk Assessment   Upper Extremity Assessment Upper Extremity Assessment: LUE deficits/detail LUE Deficits / Details: in sling and very painful. reports feels better in sling LUE Coordination: decreased fine motor    Lower Extremity Assessment Lower Extremity Assessment: RLE deficits/detail;LLE deficits/detail RLE Deficits / Details: grossly functional, pt reports general soreness with movement due to pelvic pain LLE Deficits / Details: limited by pain, limited movement against gravity at hip, functional at knee and ankle LLE: Unable to fully assess due to pain LLE Sensation: WNL LLE Coordination: WNL    Cervical / Trunk Assessment Cervical / Trunk Assessment: Kyphotic   Communication   Communication: No difficulties  Cognition Arousal/Alertness: Awake/alert Behavior During Therapy: WFL for tasks assessed/performed Overall Cognitive Status: Within Functional Limits for tasks assessed                                          General Comments General comments (skin integrity, edema, etc.): pt on 4L O2 upon arrival, tolerated mobility on RA with SpO2 93-97%, low of 86% when supine, improved to 89-93% with bed in chair position        Assessment/Plan    PT Assessment Patient needs continued PT services  PT Problem List Decreased strength;Decreased range of motion;Decreased activity tolerance;Decreased balance;Decreased mobility;Obesity;Pain       PT Treatment Interventions DME instruction;Gait training;Stair training;Functional mobility training;Therapeutic activities;Therapeutic exercise;Balance training;Patient/family education    PT Goals (Current goals can be found in the Care Plan section)  Acute Rehab PT Goals Patient Stated Goal: reduce pain PT Goal Formulation: With patient Time For Goal Achievement: 09/07/21 Potential to Achieve Goals: Good    Frequency Min 4X/week     Co-evaluation   Reason for Co-Treatment: Complexity of the patient's impairments (multi-system involvement);Necessary to address cognition/behavior during functional activity;For patient/therapist safety   OT goals addressed during session: Proper use of Adaptive equipment and DME;ADL's and self-care       AM-PAC PT "6 Clicks" Mobility  Outcome Measure Help needed turning from your back to your side while in a flat bed without  using bedrails?: Total Help needed moving from lying on your back to sitting on the side of a flat bed without using bedrails?: Total Help needed moving to and from a bed to a chair (including a wheelchair)?: Total Help needed standing up from a chair using your arms (e.g., wheelchair or bedside chair)?: Total Help needed to  walk in hospital room?: Total Help needed climbing 3-5 steps with a railing? : Total 6 Click Score: 6    End of Session Equipment Utilized During Treatment: Gait belt;Oxygen Activity Tolerance: Patient limited by fatigue;Patient limited by pain Patient left: in bed;with call bell/phone within reach;with nursing/sitter in room Nurse Communication: Mobility status PT Visit Diagnosis: Unsteadiness on feet (R26.81);Other abnormalities of gait and mobility (R26.89);Muscle weakness (generalized) (M62.81);Pain Pain - Right/Left: Right Pain - part of body: Arm (L leg/pelvis)    Time: [id number] PT Time Calculation (min) (ACUTE ONLY): 45 min   Charges:   PT Evaluation $PT Eval Moderate Complexity: 1 Mod          West Carbo, PT, DPT   Acute Rehabilitation Department  Sandra Cockayne 08/24/2021, 1:24 PM

## 2021-08-24 NOTE — Progress Notes (Addendum)
[facility] Night coverage note: S: Pt seen and evaluated at bedside due to BP 90/50.  Pt reports fatigue, and feeling hot.  Getting 500cc bolus LR.  Pt got Lasix earlier in the day.  Additionally Pt on more coreg than he typically takes at home (usually takes 12.5mg  in AM and 25mg  in PM, not 25mg  BID).  O: Arm is bandaged, pt on ancef for question of open fracture. Skin warm AAOx4  A: DDx = iatrogenic BP drop due to meds vs new onset of sepsis.  P: 1) holding coreg and lisinopril 2) check labs: CBC, CMP, procalcitonin, lactate. 3) finishing 500cc IVF bolus now, recheck vitals.  Update: SBP now 100 after bolus.

## 2021-08-24 NOTE — Progress Notes (Signed)
  Echocardiogram 2D Echocardiogram with contrast has been performed.  Mathew Oliver 08/24/2021, 12:47 PM

## 2021-08-24 NOTE — Progress Notes (Addendum)
PROGRESS NOTE    Mathew Oliver  HUT:[id number] DOB: 11/09/1960 DOA: 08/23/2021 PCP: Oneita Hurt, No    Brief Narrative:   Mathew Oliver is a 61 y.o. male with past medical history significant for HTN, HLD, CAD, chronic systolic congestive heart failure (LVEF 40-45% 2021), obesity who presented to [facility] ED on 6/10 with left elbow pain following fall from a 18-20 foot ladder.  Patient reports he was trying to untangle totes at the time when he lost his balance landing on his left side.  Denies loss of consciousness or trauma to his head.  Immediately endorsed pain to his left elbow and left hip which was worsened with any type of movement.  On EMS arrival, he was placed in a splint to his left upper extremity and transported the ED for further evaluation.  In the ED, patient was seen as a level 2 trauma.  Temperature 97.5 F, HR 80, RR 22, BP 100/80, SPO2 97% on 2 L nasal cannula.  WBC 12.6, hemoglobin 14.7, platelets 193.  Sodium 139, potassium 4.1, chloride 107, CO2 22, glucose 194.  AST 40, ALT 33, total bilirubin 0.6.  Lactic acid 2.0.  COVID-19 PCR negative.  Influenza A/B PCR negative.  Urinalysis unrevealing.  EtOH level less than 10.  Pelvis x-ray with left pubic bone, superior pubic ramus and inferior pubic ramus fractures.  Left humerus x-ray with comminuted, displaced distal left humeral fracture.  CT left upper extremity with comminuted intra-articular fracture/dislocation at the elbow with severely displaced distal humerus fracture, transverse supracondylar component with splitting of the epicondyles, partial reticulation of the radius with a capitulum, nondisplaced radial head fracture, large joint effusion and probable hematoma, extensive soft tissue swelling and soft tissue gas suggesting open fracture.  CT head/C-spine with no acute intracranial abnormality, no acute/traumatic cervical spine pathology.  CT chest abdomen/pelvis with pelvic fractures including left superior/inferior pubic rami  and left sacrum, otherwise no evidence of significant trauma or acute findings in the chest/abdomen/pelvis.  Orthopedics was consulted.  Hospital service consulted for further evaluation and management.  Assessment & Plan:   Left comminuted distal humerus fracture, concern for open Nondisplaced radial head fracture Patient presenting with left arm pain following fall from ladder approximately 18-20 feet.  Imaging studies notable for left comminuted distal humerus fracture with displacement/dislocation and nondisplaced radial head fracture.  CT also concerning for possible open fracture given extensive soft tissue swelling and soft tissue gas. --Orthopedics following, appreciate assistance --Nonweightbearing left upper extremity --Continue splint to left upper extremity --Cefazolin for possible open fracture per CT read --Percocet 5-325 mg 1-2 tabs PO q6H PRN moderate pain --Dilaudid 0.5 mg IV q3h PRN severe pain --Orthopedics, Dr. Jena Gauss plans operative management tomorrow, n.p.o. after midnight  Sacrum, pubic rami fracture Pelvis x-ray with left pubic bone fracture, superior pubic rami fracture, inferior pubic ramus fracture.  Evaluated by orthopedics, weightbearing as tolerates. --PT/OT evaluation  Acute hypoxic respiratory failure Etiology likely secondary to volume overload versus atelectasis.  Chest x-ray on admission suggestive of cardiomegaly with mild pulmonary vascular congestion, with history of chronic systolic congestive heart failure.  No focal consolidation appreciated.  Patient did receive IV fluid hydration during initial admission. --Lasix 40 mg IV x1 --Continue supplemental oxygen, maintain SPO2 >92%; currently on 5 L nasal cannula  Leukocytosis Lactic acidosis: Resolved WBC 12.6 with a lactic acid of 2.0 on admission.  Etiology likely secondary to reactive from fracture.  Patient was started on IV fluid hydration with resolution of lactic acid. --CBC  in the  a.m.  Chronic systolic congestive heart failure, decompensated Patient received IV fluid hydration due to lactic acidosis on admission, now on 5 L nasal cannula.  Previous TTE with LVEF 45-50% 2021. --Repeat TTE: Pending --Carvedilol 25 mg p.o. twice daily --Lisinopril 40 mg p.o. daily --Lasix 40 mg IV x1 --Strict I's and O's and daily weights --Supplemental oxygen, maintain SPO2 > 92%  Essential hypertension --Carvedilol 25 mg p.o. twice daily --Lisinopril to 40 mg p.o. daily --Continue aspirin and statin  CAD s/p PCI Previous history of MI with drug-eluting stent placement LAD 2009. --Continue aspirin, statin, beta-blocker --Outpatient follow-up with cardiology  Hyperlipidemia: Atorvastatin 40 mg p.o. daily  Morbid obesity Body mass index is 41.77 kg/m.  Discussed with patient needs for aggressive lifestyle changes/weight loss as this complicates all facets of care.  Outpatient follow-up with PCP.  May benefit from bariatric evaluation outpatient.   DVT prophylaxis:   Lovenox    Code Status: Full Code Family Communication: Spouse present at bedside this morning  Disposition Plan:  Level of care: Telemetry Medical Status is: Inpatient Remains inpatient appropriate because: Pending surgical intervention for comminuted/displaced humerus fracture tomorrow    Consultants:  Orthopedics, Dr. Doreatha Martin  Procedures:  TTE: Pending  Antimicrobials:  Cefazolin   Subjective: Patient seen examined at bedside, resting comfortably.  Spouse present.  Mild pain to left upper extremity.  Concerned about mobilizing this morning with PT due to his pelvic fractures.  No other specific complaints or concerns at this time.  Denies headache, no chest pain, no palpitations, no dizziness, no nausea/vomiting/diarrhea, no shortness of breath, no abdominal pain, no focal weakness, no fatigue, no paresthesias.  No acute events overnight per nursing staff.  Objective: Vitals:   08/23/21 2104  08/24/21 0359 08/24/21 0821 08/24/21 0845  BP: 95/67 121/76 120/73   Pulse: 80 88 83   Resp: 20 20 19    Temp: 98.6 F (37 C)   98 F (36.7 C)  TempSrc: Oral   Oral  SpO2: 95% 96%    Weight:    (!) 139.7 kg  Height:        Intake/Output Summary (Last 24 hours) at 08/24/2021 0917 Last data filed at 08/24/2021 0500 Gross per 24 hour  Intake --  Output 402 ml  Net -402 ml   Filed Weights   08/23/21 0100 08/24/21 0845  Weight: 136.1 kg (!) 139.7 kg    Examination:  Physical Exam: GEN: NAD, alert and oriented x 3, morbidly obese HEENT: NCAT, PERRL, EOMI, sclera clear, MMM PULM: Breath sounds slightly diminished bases, no wheezing/crackles, normal respiratory effort without accessory muscle use, on 5 L nasal cannula with SPO2 96% at rest CV: RRR w/o M/G/R GI: abd soft, NTND, NABS, no R/G/M MSK: Left upper extremity in long splint, neurovascular intact, no peripheral edema, otherwise muscle strength preserved right upper/lower extremity and left lower extremity NEURO: CN II-XII intact, no focal deficits, sensation to light touch intact PSYCH: normal mood/affect Integumentary: dry/intact, no rashes or wounds    Data Reviewed: I have personally reviewed following labs and imaging studies  CBC: Recent Labs  Lab 08/23/21 0114 08/23/21 0122 08/24/21 0239  WBC 12.6*  --  12.5*  HGB 14.7 14.3 13.2  HCT 43.3 42.0 39.7  MCV 94.5  --  94.3  PLT 193  --  [id number]   Basic Metabolic Panel: Recent Labs  Lab 08/23/21 0114 08/23/21 0122 08/24/21 0239  NA 139 138 136  K 4.1 4.0 3.9  CL 107  107 104  CO2 22  --  26  GLUCOSE 194* 195* 183*  BUN 21 25* 15  CREATININE 0.81 0.70 0.79  CALCIUM 8.7*  --  8.2*   GFR: Estimated Creatinine Clearance: 140.4 mL/min (by C-G formula based on SCr of 0.79 mg/dL). Liver Function Tests: Recent Labs  Lab 08/23/21 0114  AST 40  ALT 33  ALKPHOS 49  BILITOT 0.6  PROT 5.8*  ALBUMIN 3.3*   No results for input(s): "LIPASE", "AMYLASE" in the  last 168 hours. No results for input(s): "AMMONIA" in the last 168 hours. Coagulation Profile: Recent Labs  Lab 08/23/21 0114  INR 1.0   Cardiac Enzymes: Recent Labs  Lab 08/23/21 1714  CKTOTAL 252   BNP (last 3 results) No results for input(s): "PROBNP" in the last 8760 hours. HbA1C: Recent Labs    08/23/21 1853  HGBA1C 7.5*   CBG: No results for input(s): "GLUCAP" in the last 168 hours. Lipid Profile: No results for input(s): "CHOL", "HDL", "LDLCALC", "TRIG", "CHOLHDL", "LDLDIRECT" in the last 72 hours. Thyroid Function Tests: No results for input(s): "TSH", "T4TOTAL", "FREET4", "T3FREE", "THYROIDAB" in the last 72 hours. Anemia Panel: No results for input(s): "VITAMINB12", "FOLATE", "FERRITIN", "TIBC", "IRON", "RETICCTPCT" in the last 72 hours. Sepsis Labs: Recent Labs  Lab 08/23/21 0114 08/23/21 1714  LATICACIDVEN 2.0* 1.6    Recent Results (from the past 240 hour(s))  Resp Panel by RT-PCR (Flu A&B, Covid) Anterior Nasal Swab     Status: None   Collection Time: 08/23/21  1:30 AM   Specimen: Anterior Nasal Swab  Result Value Ref Range Status   SARS Coronavirus 2 by RT PCR NEGATIVE NEGATIVE Final    Comment: (NOTE) SARS-CoV-2 target nucleic acids are NOT DETECTED.  The SARS-CoV-2 RNA is generally detectable in upper respiratory specimens during the acute phase of infection. The lowest concentration of SARS-CoV-2 viral copies this assay can detect is 138 copies/mL. A negative result does not preclude SARS-Cov-2 infection and should not be used as the sole basis for treatment or other patient management decisions. A negative result may occur with  improper specimen collection/handling, submission of specimen other than nasopharyngeal swab, presence of viral mutation(s) within the areas targeted by this assay, and inadequate number of viral copies(<138 copies/mL). A negative result must be combined with clinical observations, patient history, and  epidemiological information. The expected result is Negative.  Fact Sheet for Patients:  EntrepreneurPulse.com.au  Fact Sheet for Healthcare Providers:  IncredibleEmployment.be  This test is no t yet approved or cleared by the [place] FDA and  has been authorized for detection and/or diagnosis of SARS-CoV-2 by FDA under an Emergency Use Authorization (EUA). This EUA will remain  in effect (meaning this test can be used) for the duration of the COVID-19 declaration under Section 564(b)(1) of the Act, 21 U.S.C.section 360bbb-3(b)(1), unless the authorization is terminated  or revoked sooner.       Influenza A by PCR NEGATIVE NEGATIVE Final   Influenza B by PCR NEGATIVE NEGATIVE Final    Comment: (NOTE) The Xpert Xpress SARS-CoV-2/FLU/RSV plus assay is intended as an aid in the diagnosis of influenza from Nasopharyngeal swab specimens and should not be used as a sole basis for treatment. Nasal washings and aspirates are unacceptable for Xpert Xpress SARS-CoV-2/FLU/RSV testing.  Fact Sheet for Patients: EntrepreneurPulse.com.au  Fact Sheet for Healthcare Providers: IncredibleEmployment.be  This test is not yet approved or cleared by the [place] FDA and has been authorized for detection and/or diagnosis of  SARS-CoV-2 by FDA under an Emergency Use Authorization (EUA). This EUA will remain in effect (meaning this test can be used) for the duration of the COVID-19 declaration under Section 564(b)(1) of the Act, 21 U.S.C. section 360bbb-3(b)(1), unless the authorization is terminated or revoked.  Performed at [facility], [place] [address] [place], [place] [address]          Radiology Studies: CT Elbow Left Wo Contrast  Result Date: 08/23/2021 CLINICAL DATA:  Elbow trauma EXAM: CT OF THE UPPER LEFT EXTREMITY WITHOUT CONTRAST TECHNIQUE: Multidetector CT imaging of the upper left  extremity was performed according to the standard protocol. RADIATION DOSE REDUCTION: This exam was performed according to the departmental dose-optimization program which includes automated exposure control, adjustment of the mA and/or kV according to patient size and/or use of iterative reconstruction technique. COMPARISON:  None Available. FINDINGS: Bones/Joint/Cartilage There is a comminuted, intra-articular fracture of the distal humerus, with transverse component through the supracondylar humerus and split of the epicondyles. Nondisplaced radial head fracture which partially articulates with the displaced capitellum. There is malalignment of the ulnar trochlear joint with medial displacement of the medial epicondyle/trochlea. The proximal radioulnar joint appears intact. There is a large joint effusion. Ligaments Suboptimally assessed by CT. Muscles and Tendons Edematous appearance of the elbow musculature. There is intramuscular gas present. Soft tissues Extensive soft tissue swelling and soft tissue gas. Probable hematoma along the elbow joint. IMPRESSION: Comminuted intra-articular fracture-dislocation at the elbow with severely displaced distal humerus fracture. There is a transverse supracondylar component with splitting of the epicondyles. Partial articulation of the radius with the capitellum. Ulnotrochlear malalignment with medially displaced medial epicondyle/trochlea fragment. Preserved proximal radioulnar articulation. Nondisplaced radial head fracture. Large joint effusion and probable hematoma along the elbow. Extensive soft tissue swelling and soft tissue gas suggesting this is an open fracture. Electronically Signed   By: Maurine Simmering M.D.   On: 08/23/2021 08:11   DG Pelvis 1-2 Views  Result Date: 08/23/2021 CLINICAL DATA:  Fall EXAM: PELVIS - 1-2 VIEW COMPARISON:  CT earlier today. FINDINGS: Left pubic fracture noted extending into the left superior pubic ramus. Left inferior pubic ramus  fracture also seen. The left sacral fracture seen on CT not as well visualized by plain film. Proximal femurs unremarkable. No dislocation. IMPRESSION: Left pubic bone, superior pubic ramus and inferior pubic ramus fractures. Electronically Signed   By: Rolm Baptise M.D.   On: 08/23/2021 02:49   DG Humerus Left  Result Date: 08/23/2021 CLINICAL DATA:  Fall EXAM: LEFT HUMERUS - 2+ VIEW COMPARISON:  Left elbow series today FINDINGS: Distal left humeral fracture noted. Significantly displaced fracture fragments. No additional humeral fracture. IMPRESSION: Comminuted, displaced distal left humeral fracture. Electronically Signed   By: Rolm Baptise M.D.   On: 08/23/2021 02:49   DG Elbow Complete Left  Result Date: 08/23/2021 CLINICAL DATA:  Fall from ladder EXAM: LEFT ELBOW - COMPLETE 3+ VIEW COMPARISON:  None Available. FINDINGS: Comminuted displaced distal left humeral fracture. Marked displacement of fracture fragments with possible dislocation of the ulna relative to the distal humeral fragments. Study is limited due to patient condition. IMPRESSION: Comminuted, displaced distal left humeral fracture. Possible dislocation of the ulna relative to the distal humeral fragments. Electronically Signed   By: Rolm Baptise M.D.   On: 08/23/2021 02:48   DG Forearm Left  Result Date: 08/23/2021 CLINICAL DATA:  Fall from ladder EXAM: LEFT FOREARM - 2 VIEW COMPARISON:  Left elbow series today FINDINGS: Distal left humeral  fracture noted. See elbow series for further discussion. No forearm fracture. IMPRESSION: Distal left humeral fracture. See elbow series for further discussion. Electronically Signed   By: Charlett Nose M.D.   On: 08/23/2021 02:47   CT CHEST ABDOMEN PELVIS W CONTRAST  Result Date: 08/23/2021 CLINICAL DATA:  Polytrauma, blunt.  Fall from ladder. EXAM: CT CHEST, ABDOMEN, AND PELVIS WITH CONTRAST TECHNIQUE: Multidetector CT imaging of the chest, abdomen and pelvis was performed following the  standard protocol during bolus administration of intravenous contrast. RADIATION DOSE REDUCTION: This exam was performed according to the departmental dose-optimization program which includes automated exposure control, adjustment of the mA and/or kV according to patient size and/or use of iterative reconstruction technique. CONTRAST:  OMNIPAQUE IOHEXOL 300 MG/ML  SOLN COMPARISON:  None Available. FINDINGS: CT CHEST FINDINGS Cardiovascular: Heart is normal size. Aorta is normal caliber. Coronary artery and aortic calcifications. Mediastinum/Nodes: No mediastinal, hilar, or axillary adenopathy. Trachea and esophagus are unremarkable. Thyroid unremarkable. Lungs/Pleura: Minimal dependent atelectasis in the lower lobes. No effusions or pneumothorax. Musculoskeletal: Chest wall soft tissues are unremarkable. No acute bony abnormality. CT ABDOMEN PELVIS FINDINGS Hepatobiliary: No hepatic injury or perihepatic hematoma. Diffuse low-density throughout the liver compatible with fatty infiltration. No focal hepatic abnormality. Gallbladder is unremarkable. Pancreas: No focal abnormality or ductal dilatation. Spleen: No splenic injury or perisplenic hematoma. Adrenals/Urinary Tract: No adrenal hemorrhage or renal injury identified. Bladder is unremarkable. Bilateral renal cysts. No hydronephrosis. Stomach/Bowel: Normal appendix. Left colonic diverticulosis. No active diverticulitis. Stomach and small bowel unremarkable. Vascular/Lymphatic: Aortic atherosclerosis. No evidence of aneurysm or adenopathy. Reproductive: No visible focal abnormality. Other: No free fluid or free air. Musculoskeletal: Fractures through the left superior and inferior pubic rami. No proximal femoral fracture. Fracture through the left sacral ala. IMPRESSION: Pelvic fractures including left superior and inferior pubic rami and left sacrum. Otherwise, no evidence of significant trauma or acute findings in the chest, abdomen or pelvis. Hepatic  steatosis. Coronary artery disease, aortic atherosclerosis. Electronically Signed   By: Charlett Nose M.D.   On: 08/23/2021 02:28   CT HEAD WO CONTRAST  Result Date: 08/23/2021 CLINICAL DATA:  Trauma. EXAM: CT HEAD WITHOUT CONTRAST CT CERVICAL SPINE WITHOUT CONTRAST TECHNIQUE: Multidetector CT imaging of the head and cervical spine was performed following the standard protocol without intravenous contrast. Multiplanar CT image reconstructions of the cervical spine were also generated. RADIATION DOSE REDUCTION: This exam was performed according to the departmental dose-optimization program which includes automated exposure control, adjustment of the mA and/or kV according to patient size and/or use of iterative reconstruction technique. COMPARISON:  None Available. FINDINGS: CT HEAD FINDINGS Brain: The ventricles and sulci are appropriate size for the patient's age. The gray-white matter discrimination is preserved. There is no acute intracranial hemorrhage. No mass effect or midline shift. No extra-axial fluid collection. Vascular: No hyperdense vessel or unexpected calcification. Skull: Normal. Negative for fracture or focal lesion. Sinuses/Orbits: No acute finding. Other: None CT CERVICAL SPINE FINDINGS Alignment: No acute subluxation. Skull base and vertebrae: No acute fracture. Soft tissues and spinal canal: No prevertebral fluid or swelling. No visible canal hematoma. Disc levels: Multilevel degenerative changes with disc space narrowing and endplate irregularity and spurring/osteophyte. Upper chest: Negative. Other: Mild bilateral carotid bulb calcified plaques. IMPRESSION: 1. No acute intracranial abnormality. 2. No acute/traumatic cervical spine pathology. Multilevel degenerative changes. Electronically Signed   By: Elgie Collard M.D.   On: 08/23/2021 02:27   CT CERVICAL SPINE WO CONTRAST  Result Date: 08/23/2021 CLINICAL DATA:  Trauma. EXAM: CT HEAD WITHOUT CONTRAST CT CERVICAL SPINE WITHOUT  CONTRAST TECHNIQUE: Multidetector CT imaging of the head and cervical spine was performed following the standard protocol without intravenous contrast. Multiplanar CT image reconstructions of the cervical spine were also generated. RADIATION DOSE REDUCTION: This exam was performed according to the departmental dose-optimization program which includes automated exposure control, adjustment of the mA and/or kV according to patient size and/or use of iterative reconstruction technique. COMPARISON:  None Available. FINDINGS: CT HEAD FINDINGS Brain: The ventricles and sulci are appropriate size for the patient's age. The gray-white matter discrimination is preserved. There is no acute intracranial hemorrhage. No mass effect or midline shift. No extra-axial fluid collection. Vascular: No hyperdense vessel or unexpected calcification. Skull: Normal. Negative for fracture or focal lesion. Sinuses/Orbits: No acute finding. Other: None CT CERVICAL SPINE FINDINGS Alignment: No acute subluxation. Skull base and vertebrae: No acute fracture. Soft tissues and spinal canal: No prevertebral fluid or swelling. No visible canal hematoma. Disc levels: Multilevel degenerative changes with disc space narrowing and endplate irregularity and spurring/osteophyte. Upper chest: Negative. Other: Mild bilateral carotid bulb calcified plaques. IMPRESSION: 1. No acute intracranial abnormality. 2. No acute/traumatic cervical spine pathology. Multilevel degenerative changes. Electronically Signed   By: Anner Crete M.D.   On: 08/23/2021 02:27   DG Chest Port 1 View  Result Date: 08/23/2021 CLINICAL DATA:  Fall EXAM: PORTABLE CHEST 1 VIEW COMPARISON:  None Available. FINDINGS: Evaluation is limited due to positioning and superimposition the trauma board. Shallow inspiration. No focal consolidation, pleural effusion, pneumothorax. Mild cardiomegaly with mild vascular congestion. There is apparent widening of the mediastinum which may be  positional. Attention on CT recommended. No obvious acute osseous pathology. IMPRESSION: Mild cardiomegaly with mild central vascular congestion. Electronically Signed   By: Anner Crete M.D.   On: 08/23/2021 01:36        Scheduled Meds:  aspirin EC  325 mg Oral QHS   atorvastatin  40 mg Oral QPM   carvedilol  25 mg Oral BID WC   enoxaparin (LOVENOX) injection  70 mg Subcutaneous Q24H   furosemide  40 mg Intravenous Once   lisinopril  40 mg Oral QPM   sodium chloride flush  3 mL Intravenous Q12H   Continuous Infusions:   LOS: 1 day    Time spent: 54 minutes spent on chart review, discussion with nursing staff, consultants, updating family and interview/physical exam; more than 50% of that time was spent in counseling and/or coordination of care.    Neyra Pettie J [place] DO [facility] Available via Epic secure chat 7am-7pm After these hours, please refer to coverage provider listed on [email or web address] 08/24/2021, 9:17 AM .

## 2021-08-24 NOTE — Evaluation (Signed)
Occupational Therapy Evaluation Patient Details Name: Mathew Oliver MRN: [id number] DOB: 27-Feb-1961 Today's Date: 08/24/2021   History of Present Illness 61 yo male presenting 6/10 after sustaining a fall off of a ladder with pelvic fractures including left superior, inferior pubic rami, and left sacrum. Pt also with comminuted L distal humerus fx, awaiting ORIF 6/12. PMH includes: tobacco use, MI.   Clinical Impression   PT admitted with multiple orthopedic injuries. Pt currently with functional limitiations due to the deficits listed below (see OT problem list). Pt currently progressing to eob sitting and with very elevated bed surface standing briefly. Pt continues to report pain with WBAT and unable to tolerate. Recommend use of bed pan at this time.  Pt will benefit from skilled OT to increase their independence and safety with adls and balance to allow discharge CIR.       Recommendations for follow up therapy are one component of a multi-disciplinary discharge planning process, led by the attending physician.  Recommendations may be updated based on patient status, additional functional criteria and insurance authorization.   Follow Up Recommendations  Acute inpatient rehab (3hours/day)    Assistance Recommended at Discharge Intermittent Supervision/Assistance  Patient can return home with the following Two people to help with walking and/or transfers;Two people to help with bathing/dressing/bathroom    Functional Status Assessment  Patient has had a recent decline in their functional status and demonstrates the ability to make significant improvements in function in a reasonable and predictable amount of time.  Equipment Recommendations  BSC/3in1;Wheelchair (measurements OT);Wheelchair cushion (measurements OT)    Recommendations for Other Services Rehab consult     Precautions / Restrictions Precautions Precautions: Fall Required Braces or Orthoses: Sling (L  UE) Restrictions Weight Bearing Restrictions: Yes LUE Weight Bearing: Non weight bearing      Mobility Bed Mobility Overal bed mobility: Needs Assistance Bed Mobility: Supine to Sit, Sit to Supine     Supine to sit: Max assist, +2 for physical assistance Sit to supine: Total assist, +2 for physical assistance   General bed mobility comments: pt attempting with max encouragement to move LE, unable to effectively manage trunk despite use of core and RUE. max-totalA to complete transfer and pt needing RUE support on bed rail to steady in sitting    Transfers Overall transfer level: Needs assistance Equipment used:  (RUE supported on recliner) Transfers: Sit to/from Stand Sit to Stand: From elevated surface, Min assist, +2 physical assistance           General transfer comment: bed elevated significantly, pt then able to maintain static stance for 5-10 seconds, limited by pain and fatigue.      Balance Overall balance assessment: Needs assistance Sitting-balance support: Single extremity supported, Feet supported Sitting balance-Leahy Scale: Fair Sitting balance - Comments: strong R lean to offweight L pelvis Postural control: Right lateral lean Standing balance support: Single extremity supported Standing balance-Leahy Scale: Poor Standing balance comment: dependent on RUE support, unable to tolerate for > 10 seconds at a time                           ADL either performed or assessed with clinical judgement   ADL Overall ADL's : Needs assistance/impaired Eating/Feeding: Bed level;Minimal assistance   Grooming: Set up;Bed level;Minimal assistance   Upper Body Bathing: Maximal assistance   Lower Body Bathing: Total assistance   Upper Body Dressing : Maximal assistance;Bed level   Lower Body Dressing: Total  assistance                 General ADL Comments: pt with bed sheet only on upon arrival. pt provided blue gown and required snapping around L  UE for pain control. pt placed in sling and pt reports increased comfort with sling don. pt requires extensive (A) for bed mobility and initially with static sitting. pt was able to progress to sit<>stand with bed very elevated but unable to sustain. recommend use of continued bed pan at this time     Vision Baseline Vision/History: 0 No visual deficits       Perception     Praxis      Pertinent Vitals/Pain Pain Assessment Pain Assessment: 0-10 Pain Score: 7  Pain Location: elbow, hip Pain Descriptors / Indicators: Stabbing Pain Intervention(s): Limited activity within patient's tolerance     Hand Dominance Right   Extremity/Trunk Assessment Upper Extremity Assessment Upper Extremity Assessment: LUE deficits/detail LUE Deficits / Details: in sling and very painful. reports feels better in sling LUE Coordination: decreased fine motor   Lower Extremity Assessment Lower Extremity Assessment: Defer to PT evaluation RLE Deficits / Details: grossly functional, pt reports general soreness with movement due to pelvic pain LLE Deficits / Details: limited by pain, limited movement against gravity at hip, functional at knee and ankle LLE: Unable to fully assess due to pain LLE Sensation: WNL LLE Coordination: WNL   Cervical / Trunk Assessment Cervical / Trunk Assessment: Kyphotic   Communication Communication Communication: No difficulties   Cognition Arousal/Alertness: Awake/alert Behavior During Therapy: WFL for tasks assessed/performed Overall Cognitive Status: Within Functional Limits for tasks assessed                                       General Comments  4L FI02 on arrival. pt tolerating RA 93-9% with decrease to 86% with supine    Exercises     Shoulder Instructions      Home Living Family/patient expects to be discharged to:: Private residence Living Arrangements: Spouse/significant other Available Help at Discharge: Family;Available 24  hours/day Type of Home: Mobile home Home Access: Stairs to enter Entrance Stairs-Number of Steps: 1-2 Entrance Stairs-Rails: None Home Layout: One level     Bathroom Shower/Tub: Chief Strategy Officer: Handicapped height     Home Equipment: Grab bars - tub/shower   Additional Comments: wife can (A)      Prior Functioning/Environment Prior Level of Function : Independent/Modified Independent;Working/employed;Driving             Mobility Comments: pt works Engineer, mining, no falls, no mobility issues          OT Problem List: Decreased strength;Decreased activity tolerance;Impaired balance (sitting and/or standing);Decreased safety awareness;Decreased knowledge of use of DME or AE;Decreased knowledge of precautions;Obesity;Pain      OT Treatment/Interventions: Self-care/ADL training;Therapeutic exercise;Energy conservation;DME and/or AE instruction;Manual therapy;Modalities;Therapeutic activities;Patient/family education;Balance training    OT Goals(Current goals can be found in the care plan section) Acute Rehab OT Goals Patient Stated Goal: to be able to stand up again OT Goal Formulation: With patient Time For Goal Achievement: 09/07/21 Potential to Achieve Goals: Good ADL Goals Pt Will Perform Grooming: with supervision;sitting Pt Will Transfer to Toilet: with +2 assist;with mod assist;stand pivot transfer;bedside commode Additional ADL Goal #1: pt will complete bed mobility total +2 min (A) as precursor to adls.  OT Frequency: Min 2X/week  Co-evaluation PT/OT/SLP Co-Evaluation/Treatment: Yes Reason for Co-Treatment: Complexity of the patient's impairments (multi-system involvement);For patient/therapist safety   OT goals addressed during session: ADL's and self-care;Proper use of Adaptive equipment and DME      AM-PAC OT "6 Clicks" Daily Activity     Outcome Measure Help from another person eating meals?: A Little Help from another  person taking care of personal grooming?: A Little Help from another person toileting, which includes using toliet, bedpan, or urinal?: A Lot Help from another person bathing (including washing, rinsing, drying)?: A Lot Help from another person to put on and taking off regular upper body clothing?: A Lot Help from another person to put on and taking off regular lower body clothing?: A Lot 6 Click Score: 14   End of Session Nurse Communication: Mobility status;Precautions  Activity Tolerance: Patient limited by pain Patient left: in bed;with call bell/phone within reach;with bed alarm set  OT Visit Diagnosis: Unsteadiness on feet (R26.81);Muscle weakness (generalized) (M62.81);Pain Pain - Right/Left: Left Pain - part of body: Hip                Time: 6553-7482 OT Time Calculation (min): 45 min Charges:  OT General Charges $OT Visit: 1 Visit OT Evaluation $OT Eval Moderate Complexity: 1 Mod   Brynn, OTR/L  Acute Rehabilitation Services Office: [phone] .   Mateo Flow 08/24/2021, 1:36 PM

## 2021-08-24 NOTE — Consult Note (Cosign Needed Addendum)
Orthopaedic Trauma Service (OTS) Consult   Patient ID: [name] MRN: [id number] DOB/AGE: 03-31-1960 61 y.o.  Reason for Consult: Left distal humerus fracture, pelvic fractures Referring Physician: Dr. Phylliss Bob, MD ([facility])  HPI: Mathew Oliver is an 61 y.o. male with medical history significant of hypertension, hyperlipidemia, CAD, heart failure with reduced EF 40-45% being seen in consultation at request of Dr. Lynann Bologna for evaluation of pelvic fractures and left distal humerus fracture after sustaining a fall from a 25 foot ladder yesterday.  Patient  had immediate pain in his left elbow and left hip.  He was taken to the emergency department and found to have complex left distal humerus fracture as well as pubic rami and sacral fractures.  Orthopedics was consulted for evaluation and management.  It was felt that patient could be weightbearing as tolerated on bilateral lower extremities for his pelvic injuries.  Due to the complex nature of the left elbow injury, Dr. Lynann Bologna felt this was outside of the scope of practice and asked orthopedic trauma service to assume care of patient for definitive fixation.  Patient seen this morning on 5N.  Denies any numbness or tingling throughout the arm.  Pain has been well controlled.  No previous injury or surgery to the left upper extremity.  Does have cardiac history and was scheduled to see his cardiologist on the 20th of this month.  He had a cardiac stent placed around 2009.  Denies history of diabetes.  Does smoke regularly.  Takes aspirin 325 mg daily.  Works as a Engineer, building services.  Is right hand dominant. Ambulates at baseline with no assistive device.  Past Medical History:  Diagnosis Date   MI, acute, non ST segment elevation ([place])     Past Surgical History:  Procedure Laterality Date   CARDIAC CATHETERIZATION      No family history on file.  Social History:  reports that he has been smoking cigarettes. He does  not have any smokeless tobacco history on file. He reports current alcohol use. He reports that he does not use drugs.  Allergies: No Known Allergies  Medications: I have reviewed the patient's current medications. Prior to Admission:  Medications Prior to Admission  Medication Sig Dispense Refill Last Dose   aspirin EC 325 MG tablet Take 325 mg by mouth at bedtime.   08/22/2021   atorvastatin (LIPITOR) 40 MG tablet Take 40 mg by mouth every evening.   08/22/2021   carvedilol (COREG) 25 MG tablet Take 25 mg by mouth 2 (two) times daily with a meal.   08/22/2021 at 1700   lisinopril (ZESTRIL) 40 MG tablet Take 40 mg by mouth every evening.   08/22/2021   multivitamin (ONE-A-DAY MEN'S) TABS tablet Take 1 tablet by mouth daily.   08/22/2021   naproxen sodium (ALEVE) 220 MG tablet Take 440 mg by mouth daily as needed (pain/headache).   Past Week   spironolactone (ALDACTONE) 25 MG tablet Take 25 mg by mouth every evening.   08/22/2021    ROS: Constitutional: No fever or chills Vision: No changes in vision ENT: No difficulty swallowing CV: No chest pain Pulm: No SOB or wheezing GI: No nausea or vomiting GU: No urgency or inability to hold urine Skin: No poor wound healing Neurologic: No numbness or tingling Psychiatric: No depression or anxiety Heme: No bruising Allergic: No reaction to medications or food   Exam: Blood pressure 121/76, pulse 88, temperature 98.6 F (37 C), temperature source Oral, resp. rate 20,  height 6' (1.829 m), weight 136.1 kg, SpO2 96 %. General: No acute distress, laying in bed Orientation: Alert and oriented x3 Mood and Affect: Mood and affect appropriate, pleasant and cooperative Gait: Limited secondary to pelvic fractures Coordination and balance: Within normal limits  LUE: Long-arm splint in place.  Nontender above splint.  Able to wiggle fingers.  Motor function intact to the median, ulnar, radial nerve distribution.  Endorses sensation to light touch throughout  all aspects of the hand.  Hand warm and well-perfused, brisk cap refill  RUE: Skin without lesions. No tenderness to palpation. Full painless ROM, full strength in each muscle groups without evidence of instability.   Medical Decision Making: Data: Imaging: CT scan left elbow shows comminuted intra-articular fracture of the distal humerus.  Labs:  Results for orders placed or performed during the hospital encounter of 08/23/21 (from the past 24 hour(s))  HIV Antibody (routine testing w rflx)     Status: None   Collection Time: 08/23/21  5:14 PM  Result Value Ref Range   HIV Screen 4th Generation wRfx Non Reactive Non Reactive  CK     Status: None   Collection Time: 08/23/21  5:14 PM  Result Value Ref Range   Total CK 252 49 - 397 U/L  Lactic acid, plasma     Status: None   Collection Time: 08/23/21  5:14 PM  Result Value Ref Range   Lactic Acid, Venous 1.6 0.5 - 1.9 mmol/L  Hemoglobin A1c     Status: Abnormal   Collection Time: 08/23/21  6:53 PM  Result Value Ref Range   Hgb A1c MFr Bld 7.5 (H) 4.8 - 5.6 %   Mean Plasma Glucose 168.55 mg/dL  CBC     Status: Abnormal   Collection Time: 08/24/21  2:39 AM  Result Value Ref Range   WBC 12.5 (H) 4.0 - 10.5 K/uL   RBC 4.21 (L) 4.22 - 5.81 MIL/uL   Hemoglobin 13.2 13.0 - 17.0 g/dL   HCT 39.7 39.0 - 52.0 %   MCV 94.3 80.0 - 100.0 fL   MCH 31.4 26.0 - 34.0 pg   MCHC 33.2 30.0 - 36.0 g/dL   RDW 12.7 11.5 - 15.5 %   Platelets 170 150 - 400 K/uL   nRBC 0.0 0.0 - 0.2 %  Basic metabolic panel     Status: Abnormal   Collection Time: 08/24/21  2:39 AM  Result Value Ref Range   Sodium 136 135 - 145 mmol/L   Potassium 3.9 3.5 - 5.1 mmol/L   Chloride 104 98 - 111 mmol/L   CO2 26 22 - 32 mmol/L   Glucose, Bld 183 (H) 70 - 99 mg/dL   BUN 15 8 - 23 mg/dL   Creatinine, Ser 0.79 0.61 - 1.24 mg/dL   Calcium 8.2 (L) 8.9 - 10.3 mg/dL   GFR, Estimated >60 >60 mL/min   Anion gap 6 5 - 15     Assessment/Plan: 61 year old male status post  fall from ladder, resulting in left distal humerus fracture multiple pelvic fractures.  We will plan to continue treating pelvic fractures nonoperatively.  He may be weightbearing as tolerated bilateral lower extremities.  In terms of the left upper extremity, patient with significant injury which require surgical intervention.  He is currently comfortable in his long-arm splint.  We will plan to proceed with open reduction internal fixation of left distal humerus tomorrow (08/25/2021.  Risk and benefits of procedure were discussed with the patient and his wife  at bedside. Risks discussed included bleeding, infection, malunion, nonunion, damage to surrounding nerves and blood vessels, pain, hardware prominence or irritation, hardware failure, elbow stiffness, post-traumatic arthritis, DVT/PE, compartment syndrome, and even anesthesia complications.  Patient states understanding of these risk and agrees to proceed with surgery.  Consent will be obtained.  Please keep patient n.p.o. after midnight  Gwinda Passe PA-C [facility] [phone] (office) [email or web address]

## 2021-08-25 ENCOUNTER — Encounter (HOSPITAL_COMMUNITY)
Admission: EM | Disposition: A | Discharge status: Rehabilitation Facility | Payer: Self-pay | Source: Home / Self Care | Attending: Internal Medicine

## 2021-08-25 ENCOUNTER — Inpatient Hospital Stay (HOSPITAL_COMMUNITY): Payer: No Typology Code available for payment source | Admitting: Certified Registered"

## 2021-08-25 ENCOUNTER — Inpatient Hospital Stay (HOSPITAL_COMMUNITY): Payer: Self-pay

## 2021-08-25 ENCOUNTER — Encounter (HOSPITAL_COMMUNITY): Payer: Self-pay | Admitting: Internal Medicine

## 2021-08-25 DIAGNOSIS — I2510 Atherosclerotic heart disease of native coronary artery without angina pectoris: Secondary | ICD-10-CM

## 2021-08-25 DIAGNOSIS — S42402A Unspecified fracture of lower end of left humerus, initial encounter for closed fracture: Secondary | ICD-10-CM | POA: Diagnosis not present

## 2021-08-25 DIAGNOSIS — I10 Essential (primary) hypertension: Secondary | ICD-10-CM | POA: Diagnosis not present

## 2021-08-25 DIAGNOSIS — I509 Heart failure, unspecified: Secondary | ICD-10-CM

## 2021-08-25 DIAGNOSIS — I110 Hypertensive heart disease with heart failure: Secondary | ICD-10-CM

## 2021-08-25 DIAGNOSIS — W11XXXA Fall on and from ladder, initial encounter: Secondary | ICD-10-CM | POA: Diagnosis not present

## 2021-08-25 HISTORY — PX: ORIF HUMERUS FRACTURE: SHX2126

## 2021-08-25 LAB — BASIC METABOLIC PANEL
Anion gap: 7 (ref 5–15)
BUN: 20 mg/dL (ref 8–23)
CO2: 28 mmol/L (ref 22–32)
Calcium: 8.4 mg/dL — ABNORMAL LOW (ref 8.9–10.3)
Chloride: 102 mmol/L (ref 98–111)
Creatinine, Ser: 0.96 mg/dL (ref 0.61–1.24)
GFR, Estimated: >60 mL/min (ref >60)
Glucose, Bld: 177 mg/dL — ABNORMAL HIGH (ref 70–99)
Potassium: 3.7 mmol/L (ref 3.5–5.1)
Sodium: 137 mmol/L (ref 135–145)

## 2021-08-25 LAB — PROCALCITONIN: Procalcitonin: <0.1 ng/mL

## 2021-08-25 LAB — CBC
HCT: 36.7 % — ABNORMAL LOW (ref 39.0–52.0)
Hemoglobin: 12.2 g/dL — ABNORMAL LOW (ref 13.0–17.0)
MCH: 31.4 pg (ref 26.0–34.0)
MCHC: 33.2 g/dL (ref 30.0–36.0)
MCV: 94.3 fL (ref 80.0–100.0)
Platelets: 164 10*3/uL (ref 150–400)
RBC: 3.89 MIL/uL — ABNORMAL LOW (ref 4.22–5.81)
RDW: 12.5 % (ref 11.5–15.5)
WBC: 10.9 10*3/uL — ABNORMAL HIGH (ref 4.0–10.5)
nRBC: 0 % (ref 0.0–0.2)

## 2021-08-25 LAB — LACTIC ACID, PLASMA: Lactic Acid, Venous: 0.9 mmol/L (ref 0.5–1.9)

## 2021-08-25 LAB — SURGICAL PCR SCREEN
MRSA, PCR: NEGATIVE
Staphylococcus aureus: POSITIVE — AB

## 2021-08-25 LAB — MAGNESIUM: Magnesium: 2.2 mg/dL (ref 1.7–2.4)

## 2021-08-25 SURGERY — OPEN REDUCTION INTERNAL FIXATION (ORIF) DISTAL HUMERUS FRACTURE
Anesthesia: General | Laterality: Left

## 2021-08-25 MED ORDER — DEXAMETHASONE SODIUM PHOSPHATE 10 MG/ML IJ SOLN
INTRAMUSCULAR | Status: DC | PRN
  Administered 2021-08-25: 10 mg via INTRAVENOUS

## 2021-08-25 MED ORDER — LIDOCAINE 2% (20 MG/ML) 5 ML SYRINGE
INTRAMUSCULAR | Status: AC
  Filled 2021-08-25: qty 5

## 2021-08-25 MED ORDER — METHOCARBAMOL 500 MG PO TABS
500.0000 mg | ORAL_TABLET | Freq: Four times a day (QID) | ORAL | Status: DC | PRN
  Administered 2021-08-26 – 2021-08-29 (×5): 500 mg via ORAL
  Filled 2021-08-25 (×6): qty 1

## 2021-08-25 MED ORDER — FENTANYL CITRATE (PF) 250 MCG/5ML IJ SOLN
INTRAMUSCULAR | Status: DC | PRN
  Administered 2021-08-25 (×2): 100 ug via INTRAVENOUS
  Administered 2021-08-25: 50 ug via INTRAVENOUS
  Administered 2021-08-25: 100 ug via INTRAVENOUS
  Administered 2021-08-25: 150 ug via INTRAVENOUS

## 2021-08-25 MED ORDER — ENOXAPARIN SODIUM 80 MG/0.8ML IJ SOSY
70.0000 mg | PREFILLED_SYRINGE | INTRAMUSCULAR | Status: DC
  Administered 2021-08-26 – 2021-08-31 (×6): 70 mg via SUBCUTANEOUS
  Filled 2021-08-25 (×6): qty 0.7

## 2021-08-25 MED ORDER — POLYETHYLENE GLYCOL 3350 17 G PO PACK: 17.0000 g | PACK | Freq: Every day | ORAL | Status: DC | PRN

## 2021-08-25 MED ORDER — PROPOFOL 10 MG/ML IV BOLUS
INTRAVENOUS | Status: AC
  Filled 2021-08-25: qty 20

## 2021-08-25 MED ORDER — METOCLOPRAMIDE HCL 5 MG/ML IJ SOLN: 5.0000 mg | Freq: Three times a day (TID) | INTRAMUSCULAR | Status: DC | PRN

## 2021-08-25 MED ORDER — ACETAMINOPHEN 10 MG/ML IV SOLN: 1000.0000 mg | Freq: Once | INTRAVENOUS | Status: DC | PRN

## 2021-08-25 MED ORDER — ONDANSETRON HCL 4 MG/2ML IJ SOLN
INTRAMUSCULAR | Status: AC
Start: 2021-08-25 — End: ?
  Filled 2021-08-25: qty 2

## 2021-08-25 MED ORDER — DOCUSATE SODIUM 100 MG PO CAPS
100.0000 mg | ORAL_CAPSULE | Freq: Two times a day (BID) | ORAL | Status: DC
  Administered 2021-08-25 – 2021-08-31 (×11): 100 mg via ORAL
  Filled 2021-08-25 (×12): qty 1

## 2021-08-25 MED ORDER — OXYCODONE-ACETAMINOPHEN 5-325 MG PO TABS
1.0000 | ORAL_TABLET | Freq: Four times a day (QID) | ORAL | Status: DC | PRN
  Administered 2021-08-25 – 2021-08-26 (×2): 2 via ORAL
  Administered 2021-08-26: 1 via ORAL
  Administered 2021-08-26 – 2021-08-31 (×13): 2 via ORAL
  Filled 2021-08-25: qty 2
  Filled 2021-08-25: qty 1
  Filled 2021-08-25 (×10): qty 2
  Filled 2021-08-25: qty 1
  Filled 2021-08-25 (×5): qty 2

## 2021-08-25 MED ORDER — SODIUM CHLORIDE 0.9 % IV SOLN: INTRAVENOUS | Status: DC

## 2021-08-25 MED ORDER — MIDAZOLAM HCL 2 MG/2ML IJ SOLN
INTRAMUSCULAR | Status: AC
  Filled 2021-08-25: qty 2

## 2021-08-25 MED ORDER — ONDANSETRON HCL 4 MG PO TABS: 4.0000 mg | ORAL_TABLET | Freq: Four times a day (QID) | ORAL | Status: DC | PRN

## 2021-08-25 MED ORDER — ACETAMINOPHEN 500 MG PO TABS: 1000.0000 mg | ORAL_TABLET | Freq: Once | ORAL | Status: DC | PRN

## 2021-08-25 MED ORDER — MIDAZOLAM HCL 2 MG/2ML IJ SOLN
INTRAMUSCULAR | Status: DC | PRN
  Administered 2021-08-25: 2 mg via INTRAVENOUS

## 2021-08-25 MED ORDER — MUPIROCIN 2 % EX OINT
1.0000 "application " | TOPICAL_OINTMENT | Freq: Two times a day (BID) | CUTANEOUS | Status: AC
  Administered 2021-08-25 – 2021-08-29 (×10): 1 via NASAL
  Filled 2021-08-25 (×5): qty 22

## 2021-08-25 MED ORDER — VANCOMYCIN HCL 1000 MG IV SOLR
INTRAVENOUS | Status: DC | PRN
  Administered 2021-08-25: 1000 mg via TOPICAL

## 2021-08-25 MED ORDER — OXYCODONE HCL 5 MG/5ML PO SOLN: 5.0000 mg | Freq: Once | ORAL | Status: DC | PRN

## 2021-08-25 MED ORDER — ONDANSETRON HCL 4 MG/2ML IJ SOLN: 4.0000 mg | Freq: Four times a day (QID) | INTRAMUSCULAR | Status: DC | PRN

## 2021-08-25 MED ORDER — ROCURONIUM BROMIDE 10 MG/ML (PF) SYRINGE
PREFILLED_SYRINGE | INTRAVENOUS | Status: AC
  Filled 2021-08-25: qty 10

## 2021-08-25 MED ORDER — CHLORHEXIDINE GLUCONATE CLOTH 2 % EX PADS
6.0000 | MEDICATED_PAD | Freq: Every day | CUTANEOUS | Status: AC
  Administered 2021-08-25 – 2021-08-29 (×4): 6 via TOPICAL

## 2021-08-25 MED ORDER — ACETAMINOPHEN 160 MG/5ML PO SOLN: 1000.0000 mg | Freq: Once | ORAL | Status: DC | PRN

## 2021-08-25 MED ORDER — ORAL CARE MOUTH RINSE: 15.0000 mL | Freq: Once | OROMUCOSAL | Status: AC

## 2021-08-25 MED ORDER — OXYCODONE HCL 5 MG PO TABS: 5.0000 mg | ORAL_TABLET | Freq: Once | ORAL | Status: DC | PRN

## 2021-08-25 MED ORDER — FENTANYL CITRATE (PF) 250 MCG/5ML IJ SOLN
INTRAMUSCULAR | Status: AC
  Filled 2021-08-25: qty 5

## 2021-08-25 MED ORDER — TRAMADOL HCL 50 MG PO TABS
50.0000 mg | ORAL_TABLET | Freq: Four times a day (QID) | ORAL | Status: DC
  Administered 2021-08-25 – 2021-08-31 (×23): 50 mg via ORAL
  Filled 2021-08-25 (×23): qty 1

## 2021-08-25 MED ORDER — CHLORHEXIDINE GLUCONATE 0.12 % MT SOLN
OROMUCOSAL | Status: AC
  Administered 2021-08-25: 15 mL via OROMUCOSAL
  Filled 2021-08-25: qty 15

## 2021-08-25 MED ORDER — PROPOFOL 10 MG/ML IV BOLUS
INTRAVENOUS | Status: DC | PRN
  Administered 2021-08-25: 120 mg via INTRAVENOUS

## 2021-08-25 MED ORDER — 0.9 % SODIUM CHLORIDE (POUR BTL) OPTIME
TOPICAL | Status: DC | PRN
  Administered 2021-08-25: 1000 mL

## 2021-08-25 MED ORDER — HYDROMORPHONE HCL 1 MG/ML IJ SOLN
0.5000 mg | INTRAMUSCULAR | Status: DC | PRN
  Administered 2021-08-25: 1 mg via INTRAVENOUS
  Administered 2021-08-26: 0.5 mg via INTRAVENOUS
  Administered 2021-08-27 – 2021-08-29 (×8): 1 mg via INTRAVENOUS
  Filled 2021-08-25 (×10): qty 1

## 2021-08-25 MED ORDER — ONDANSETRON HCL 4 MG/2ML IJ SOLN
INTRAMUSCULAR | Status: DC | PRN
  Administered 2021-08-25: 4 mg via INTRAVENOUS

## 2021-08-25 MED ORDER — SUGAMMADEX SODIUM 200 MG/2ML IV SOLN
INTRAVENOUS | Status: DC | PRN
  Administered 2021-08-25: 200 mg via INTRAVENOUS

## 2021-08-25 MED ORDER — CHLORHEXIDINE GLUCONATE 0.12 % MT SOLN
15.0000 mL | Freq: Once | OROMUCOSAL | Status: AC
Start: 2021-08-25 — End: 2021-08-25

## 2021-08-25 MED ORDER — CEFAZOLIN SODIUM-DEXTROSE 2-4 GM/100ML-% IV SOLN
2.0000 g | Freq: Three times a day (TID) | INTRAVENOUS | Status: AC
  Administered 2021-08-25 – 2021-08-26 (×3): 2 g via INTRAVENOUS
  Filled 2021-08-25 (×3): qty 100

## 2021-08-25 MED ORDER — LIDOCAINE 2% (20 MG/ML) 5 ML SYRINGE
INTRAMUSCULAR | Status: DC | PRN
  Administered 2021-08-25: 100 mg via INTRAVENOUS

## 2021-08-25 MED ORDER — FENTANYL CITRATE (PF) 100 MCG/2ML IJ SOLN: 25.0000 ug | INTRAMUSCULAR | Status: DC | PRN

## 2021-08-25 MED ORDER — DEXAMETHASONE SODIUM PHOSPHATE 10 MG/ML IJ SOLN
INTRAMUSCULAR | Status: AC
Start: 2021-08-25 — End: ?
  Filled 2021-08-25: qty 1

## 2021-08-25 MED ORDER — ROCURONIUM BROMIDE 10 MG/ML (PF) SYRINGE
PREFILLED_SYRINGE | INTRAVENOUS | Status: DC | PRN
  Administered 2021-08-25: 100 mg via INTRAVENOUS

## 2021-08-25 MED ORDER — LACTATED RINGERS IV SOLN: INTRAVENOUS | Status: DC

## 2021-08-25 MED ORDER — METHOCARBAMOL 1000 MG/10ML IJ SOLN
500.0000 mg | Freq: Four times a day (QID) | INTRAVENOUS | Status: DC | PRN
  Filled 2021-08-25: qty 5

## 2021-08-25 MED ORDER — METOCLOPRAMIDE HCL 5 MG PO TABS: 5.0000 mg | ORAL_TABLET | Freq: Three times a day (TID) | ORAL | Status: DC | PRN

## 2021-08-25 MED ORDER — VANCOMYCIN HCL 1000 MG IV SOLR
INTRAVENOUS | Status: AC
  Filled 2021-08-25: qty 20

## 2021-08-25 MED ORDER — CEFAZOLIN SODIUM-DEXTROSE 2-3 GM-%(50ML) IV SOLR
INTRAVENOUS | Status: DC | PRN
  Administered 2021-08-25: 2 g via INTRAVENOUS

## 2021-08-25 SURGICAL SUPPLY — 93 items
BAG COUNTER SPONGE SURGICOUNT (BAG) ×2 IMPLANT
BAG SPNG CNTER NS LX DISP (BAG) ×1
BIT DRILL 3.2 QUICK MINI 300 (DRILL) ×1 IMPLANT
BIT DRILL 5.0 QC 6.5 (BIT) ×1 IMPLANT
BIT DRILL QC 2.0 SHORT EVOS SM (DRILL) IMPLANT
BIT DRILL QC 2.5MM SHRT EVO SM (DRILL) IMPLANT
BLADE AVERAGE 25X9 (BLADE) ×1 IMPLANT
BNDG CMPR 9X4 STRL LF SNTH (GAUZE/BANDAGES/DRESSINGS)
BNDG CMPR MED 10X6 ELC LF (GAUZE/BANDAGES/DRESSINGS) ×1
BNDG COHESIVE 4X5 TAN STRL (GAUZE/BANDAGES/DRESSINGS) ×2 IMPLANT
BNDG ELASTIC 4X5.8 VLCR STR LF (GAUZE/BANDAGES/DRESSINGS) ×1 IMPLANT
BNDG ELASTIC 6X10 VLCR STRL LF (GAUZE/BANDAGES/DRESSINGS) ×1 IMPLANT
BNDG ESMARK 4X9 LF (GAUZE/BANDAGES/DRESSINGS) IMPLANT
BNDG GAUZE ELAST 4 BULKY (GAUZE/BANDAGES/DRESSINGS) ×3 IMPLANT
BRUSH SCRUB EZ PLAIN DRY (MISCELLANEOUS) ×4 IMPLANT
CLEANER TIP ELECTROSURG 2X2 (MISCELLANEOUS) ×2 IMPLANT
CORD BIPOLAR FORCEPS 12FT (ELECTRODE) ×2 IMPLANT
COVER SURGICAL LIGHT HANDLE (MISCELLANEOUS) ×2 IMPLANT
DRAIN PENROSE 1/4X12 LTX STRL (WOUND CARE) IMPLANT
DRAPE C-ARM 42X72 X-RAY (DRAPES) ×2 IMPLANT
DRAPE C-ARMOR (DRAPES) ×1 IMPLANT
DRAPE HALF SHEET 40X57 (DRAPES) ×2 IMPLANT
DRAPE INCISE IOBAN 66X45 STRL (DRAPES) ×1 IMPLANT
DRAPE ORTHO SPLIT 77X108 STRL (DRAPES) ×2
DRAPE SURG ORHT 6 SPLT 77X108 (DRAPES) ×1 IMPLANT
DRAPE U-SHAPE 47X51 STRL (DRAPES) ×2 IMPLANT
DRILL QC 2.0 SHORT EVOS SM (DRILL) ×2
DRILL QC 2.5MM SHORT EVOS SM (DRILL) ×2
DRSG ADAPTIC 3X8 NADH LF (GAUZE/BANDAGES/DRESSINGS) ×2 IMPLANT
DRSG MEPITEL 4X7.2 (GAUZE/BANDAGES/DRESSINGS) ×1 IMPLANT
DRSG PAD ABDOMINAL 8X10 ST (GAUZE/BANDAGES/DRESSINGS) ×2 IMPLANT
ELECT REM PT RETURN 9FT ADLT (ELECTROSURGICAL) ×2
ELECTRODE REM PT RTRN 9FT ADLT (ELECTROSURGICAL) ×1 IMPLANT
EVACUATOR 1/8 PVC DRAIN (DRAIN) IMPLANT
GAUZE SPONGE 4X4 12PLY STRL (GAUZE/BANDAGES/DRESSINGS) ×4 IMPLANT
GAUZE SPONGE 4X4 12PLY STRL LF (GAUZE/BANDAGES/DRESSINGS) ×1 IMPLANT
GLOVE BIO SURGEON STRL SZ 6.5 (GLOVE) ×6 IMPLANT
GLOVE BIO SURGEON STRL SZ7.5 (GLOVE) ×6 IMPLANT
GLOVE BIOGEL PI IND STRL 6.5 (GLOVE) ×1 IMPLANT
GLOVE BIOGEL PI IND STRL 7.5 (GLOVE) ×1 IMPLANT
GLOVE BIOGEL PI INDICATOR 6.5 (GLOVE) ×1
GLOVE BIOGEL PI INDICATOR 7.5 (GLOVE) ×1
GOWN STRL REUS W/ TWL LRG LVL3 (GOWN DISPOSABLE) ×3 IMPLANT
GOWN STRL REUS W/ TWL XL LVL3 (GOWN DISPOSABLE) ×1 IMPLANT
GOWN STRL REUS W/TWL LRG LVL3 (GOWN DISPOSABLE) ×6
GOWN STRL REUS W/TWL XL LVL3 (GOWN DISPOSABLE) ×2
K-WIRE 1.25 (WIRE) ×6
K-WIRE 1.6 (WIRE) ×4
K-WIRE FX150X1.6XTROC PNT (WIRE) ×2
KIT BASIN OR (CUSTOM PROCEDURE TRAY) ×2 IMPLANT
KIT TURNOVER KIT B (KITS) ×2 IMPLANT
KWIRE FX150X1.6XTROC PNT (WIRE) IMPLANT
LOOP VESSEL MAXI BLUE (MISCELLANEOUS) ×2 IMPLANT
MANIFOLD NEPTUNE II (INSTRUMENTS) ×2 IMPLANT
NDL HYPO 25X1 1.5 SAFETY (NEEDLE) IMPLANT
NEEDLE HYPO 25X1 1.5 SAFETY (NEEDLE) IMPLANT
NS IRRIG 1000ML POUR BTL (IV SOLUTION) ×2 IMPLANT
PACK ORTHO EXTREMITY (CUSTOM PROCEDURE TRAY) ×2 IMPLANT
PAD ABD 8X10 STRL (GAUZE/BANDAGES/DRESSINGS) ×1 IMPLANT
PAD ARMBOARD 7.5X6 YLW CONV (MISCELLANEOUS) ×4 IMPLANT
PLATE HUM EVOS 3H L 2.7X80 (Plate) ×1 IMPLANT
PLATE HUM EVOS 6H L 2.7X85 (Plate) ×1 IMPLANT
SCREW CANN 100X22X6.5 (Screw) IMPLANT
SCREW CANN PT 6.5X100 (Screw) ×2 IMPLANT
SCREW CORT 3.5X22 ST EVOS (Screw) ×2 IMPLANT
SCREW CORT 3.5X30 ST EVOS (Screw) ×1 IMPLANT
SCREW CORT VA EVOS 2.7X70 (Screw) ×1 IMPLANT
SCREW CORTEX 3.5X18 EVOS (Screw) ×1 IMPLANT
SCREW CORTEX 3.5X24MM (Screw) ×1 IMPLANT
SCREW LOCK 2.7X30 (Screw) ×1 IMPLANT
SCREW LOCK EVOS 2.7X32 (Screw) ×1 IMPLANT
SCREW LOCK ST EVOS 2.7X22 (Screw) ×3 IMPLANT
SCREW LOCK ST EVOS 2.7X24 (Screw) ×1 IMPLANT
SCREW LOCK ST EVOS 2.7X65 (Screw) ×1 IMPLANT
SPONGE T-LAP 18X18 ~~LOC~~+RFID (SPONGE) IMPLANT
STAPLER VISISTAT 35W (STAPLE) IMPLANT
STOCKINETTE IMPERVIOUS 9X36 MD (GAUZE/BANDAGES/DRESSINGS) ×2 IMPLANT
SUCTION FRAZIER HANDLE 10FR (MISCELLANEOUS) ×1
SUCTION TUBE FRAZIER 10FR DISP (MISCELLANEOUS) ×1 IMPLANT
SUT ETHILON 3 0 PS 1 (SUTURE) ×4 IMPLANT
SUT VIC AB 0 CT1 27 (SUTURE) ×2
SUT VIC AB 0 CT1 27XBRD ANBCTR (SUTURE) ×2 IMPLANT
SUT VIC AB 2-0 CT1 27 (SUTURE) ×2
SUT VIC AB 2-0 CT1 TAPERPNT 27 (SUTURE) ×2 IMPLANT
SYR 5ML LL (SYRINGE) IMPLANT
SYR CONTROL 10ML LL (SYRINGE) ×2 IMPLANT
TOWEL GREEN STERILE (TOWEL DISPOSABLE) ×2 IMPLANT
TOWEL GREEN STERILE FF (TOWEL DISPOSABLE) ×2 IMPLANT
TRAY FOLEY MTR SLVR 16FR STAT (SET/KITS/TRAYS/PACK) IMPLANT
WASHER CANN 12.7 STRL (Washer) ×1 IMPLANT
WATER STERILE IRR 1000ML POUR (IV SOLUTION) ×1 IMPLANT
WIRE FX150X1.25XTROC PNT (WIRE) IMPLANT
YANKAUER SUCT BULB TIP NO VENT (SUCTIONS) IMPLANT

## 2021-08-25 NOTE — TOC CAGE-AID Note (Signed)
Transition of Care [facility]) - CAGE-AID Screening   Patient Details  Name: Mathew Oliver MRN: [id number] Date of Birth: [birth year]  Transition of [facility] CM/SW Contact:    Kaitlyn Franko C Tarpley-Carter, [place] Phone Number: 08/25/2021, 3:38 PM   Clinical Narrative: Pt is unable to participate in Cage Aid. Pt is currently in surgery.  Tayna Smethurst Tarpley-Carter, MSW, LCSW-A Pronouns:  She/Her/Hers [facility] HealthTransitions of Care Clinical Social Worker Direct Number:  [phone] [email or web address]  CAGE-AID Screening: Substance Abuse Screening unable to be completed due to: : Patient unable to participate

## 2021-08-25 NOTE — Anesthesia Procedure Notes (Signed)
Procedure Name: Intubation Date/Time: 08/25/2021 12:02 PM  Performed by: Rosiland Oz, CRNAPre-anesthesia Checklist: Patient identified, Emergency Drugs available, Suction available, Patient being monitored and Timeout performed Patient Re-evaluated:Patient Re-evaluated prior to induction Oxygen Delivery Method: Circle system utilized Preoxygenation: Pre-oxygenation with 100% oxygen Induction Type: IV induction Ventilation: Mask ventilation without difficulty and Oral airway inserted - appropriate to patient size Laryngoscope Size: Miller and 3 Grade View: Grade I Tube type: Oral Tube size: 7.5 mm Number of attempts: 1 Airway Equipment and Method: Stylet Placement Confirmation: ETT inserted through vocal cords under direct vision, positive ETCO2 and breath sounds checked- equal and bilateral Secured at: 22 cm Tube secured with: Tape Dental Injury: Teeth and Oropharynx as per pre-operative assessment

## 2021-08-25 NOTE — Progress Notes (Signed)
Pt. Arrived back to unit alert and oriented, c/o pain, family at bedside

## 2021-08-25 NOTE — Transfer of Care (Signed)
Immediate Anesthesia Transfer of Care Note  Patient: Mathew Oliver  Procedure(s) Performed: OPEN REDUCTION INTERNAL FIXATION (ORIF) DISTAL HUMERUS FRACTURE (Left)  Patient Location: PACU  Anesthesia Type:General  Level of Consciousness: drowsy and patient cooperative  Airway & Oxygen Therapy: Patient Spontanous Breathing and Patient connected to nasal cannula oxygen  Post-op Assessment: Report given to RN and Post -op Vital signs reviewed and stable  Post vital signs: Reviewed and stable  Last Vitals:  Vitals Value Taken Time  BP 138/92 08/25/21 1449  Temp    Pulse 101 08/25/21 1450  Resp 24 08/25/21 1450  SpO2 85 % 08/25/21 1450  Vitals shown include unvalidated device data.  Last Pain:  Vitals:   08/25/21 1020  TempSrc: Oral  PainSc: 2          Complications: No notable events documented.

## 2021-08-25 NOTE — Progress Notes (Signed)
PROGRESS NOTE    Mathew Oliver  TMA:[id number] DOB: [birth year] DOA: 08/23/2021 PCP: Oneita Hurt, No    Brief Narrative:   Mathew Oliver is a 61 y.o. male with past medical history significant for HTN, HLD, CAD, chronic systolic congestive heart failure (LVEF 40-45% 2021), obesity who presented to [facility] ED on 6/10 with left elbow pain following fall from a 18-20 foot ladder.  Patient reports he was trying to untangle totes at the time when he lost his balance landing on his left side.  Denies loss of consciousness or trauma to his head.  Immediately endorsed pain to his left elbow and left hip which was worsened with any type of movement.  On EMS arrival, he was placed in a splint to his left upper extremity and transported the ED for further evaluation.  In the ED, patient was seen as a level 2 trauma.  Temperature 97.5 F, HR 80, RR 22, BP 100/80, SPO2 97% on 2 L nasal cannula.  WBC 12.6, hemoglobin 14.7, platelets 193.  Sodium 139, potassium 4.1, chloride 107, CO2 22, glucose 194.  AST 40, ALT 33, total bilirubin 0.6.  Lactic acid 2.0.  COVID-19 PCR negative.  Influenza A/B PCR negative.  Urinalysis unrevealing.  EtOH level less than 10.  Pelvis x-ray with left pubic bone, superior pubic ramus and inferior pubic ramus fractures.  Left humerus x-ray with comminuted, displaced distal left humeral fracture.  CT left upper extremity with comminuted intra-articular fracture/dislocation at the elbow with severely displaced distal humerus fracture, transverse supracondylar component with splitting of the epicondyles, partial reticulation of the radius with a capitulum, nondisplaced radial head fracture, large joint effusion and probable hematoma, extensive soft tissue swelling and soft tissue gas suggesting open fracture.  CT head/C-spine with no acute intracranial abnormality, no acute/traumatic cervical spine pathology.  CT chest abdomen/pelvis with pelvic fractures including left superior/inferior pubic rami  and left sacrum, otherwise no evidence of significant trauma or acute findings in the chest/abdomen/pelvis.  Orthopedics was consulted.  Hospital service consulted for further evaluation and management.  Assessment & Plan:   Left comminuted distal humerus fracture, concern for open Nondisplaced radial head fracture Patient presenting with left arm pain following fall from ladder approximately 18-20 feet.  Imaging studies notable for left comminuted distal humerus fracture with displacement/dislocation and nondisplaced radial head fracture.  CT also concerning for possible open fracture given extensive soft tissue swelling and soft tissue gas. --Orthopedics following, appreciate assistance --Nonweightbearing left upper extremity --Continue splint to left upper extremity --Started on Cefazolin for possible open fracture per CT read --Percocet 5-325 mg 1-2 tabs PO q6H PRN moderate pain --Dilaudid 0.5 mg IV q3h PRN severe pain --Orthopedics, Dr. Jena Gauss plans operative management today, n.p.o.  Sacrum, pubic rami fracture Pelvis x-ray with left pubic bone fracture, superior pubic rami fracture, inferior pubic ramus fracture.  Evaluated by orthopedics, weightbearing as tolerates. --PT/OT recommending CIR, rehab MD to review --Continue therapy efforts while inpatient  Acute hypoxic respiratory failure Etiology likely secondary to volume overload versus atelectasis.  Chest x-ray on admission suggestive of cardiomegaly with mild pulmonary vascular congestion, with history of chronic systolic congestive heart failure.  No focal consolidation appreciated.  Patient did receive IV fluid hydration during initial admission.  Received furosemide 40 mg IV x1 on 6/11. --Continue supplemental oxygen, maintain SPO2 >92%; currently on 2 L nasal cannula with SPO2 95% at rest  Leukocytosis Lactic acidosis: Resolved WBC 12.6 with a lactic acid of 2.0 on admission.  Etiology likely  secondary to reactive from  fracture.  Patient was started on IV fluid hydration with resolution of lactic acid. --WBC 12.6>12.5>12.5>10.9 --CBC in the a.m.  Chronic systolic congestive heart failure, decompensated Patient received IV fluid hydration due to lactic acidosis on admission, now on 5 L nasal cannula.  TTE 6/11 with LVEF 40-45%, positive regional wall motion normalities LV, LV mildly dilated, grade 2 diastolic dysfunction, hyperdynamic LV, BAE, trivial MR, no aortic stenosis, no significant change from prior TEE. --Carvedilol 25 mg p.o. twice daily --Lisinopril 40 mg p.o. daily --Strict I's and O's and daily weights --Supplemental oxygen, maintain SPO2 > 92%  Essential hypertension --Carvedilol 25 mg p.o. twice daily --Lisinopril to 40 mg p.o. daily --Continue aspirin and statin  CAD s/p PCI Previous history of MI with drug-eluting stent placement LAD 2009. --Continue aspirin, statin, beta-blocker --Outpatient follow-up with cardiology  Hyperlipidemia: Atorvastatin 40 mg p.o. daily  Morbid obesity Body mass index is 41.77 kg/m.  Discussed with patient needs for aggressive lifestyle changes/weight loss as this complicates all facets of care.  Outpatient follow-up with PCP.  May benefit from bariatric evaluation outpatient.   DVT prophylaxis:   Lovenox    Code Status: Full Code Family Communication: Spouse present at bedside this morning  Disposition Plan:  Level of care: Telemetry Medical Status is: Inpatient Remains inpatient appropriate because: Pending surgical intervention for comminuted/displaced humerus fracture today, anticipate likely CIR versus SNF placement    Consultants:  Orthopedics, Dr. Jena Gauss  Procedures:  TTE:  Antimicrobials:  Cefazolin   Subjective: Patient seen examined at bedside, resting comfortably.  Spouse present.  Pain controlled.  Awaiting operative management later this morning.  Seen by PT/OT with recommendations of inpatient rehab.  No other specific  complaints or concerns at this time.  Denies headache, no chest pain, no palpitations, no dizziness, no nausea/vomiting/diarrhea, no shortness of breath, no abdominal pain, no focal weakness, no fatigue, no paresthesias.  No acute events overnight per nursing staff.  Objective: Vitals:   08/24/21 2346 08/25/21 0348 08/25/21 0808 08/25/21 1020  BP: 120/78 102/72 129/83 101/76  Pulse: 76  72 74  Resp: Temp:  98.6 F (37 C) 98.2 F (36.8 C)   TempSrc:  Oral Oral Oral  SpO2: 95%  96% 93%  Weight:      Height:        Intake/Output Summary (Last 24 hours) at 08/25/2021 1210 Last data filed at 08/25/2021 0700 Gross per 24 hour  Intake 700 ml  Output 500 ml  Net 200 ml   Filed Weights   08/23/21 0100 08/24/21 0845  Weight: 136.1 kg (!) 139.7 kg    Examination:  Physical Exam: GEN: NAD, alert and oriented x 3, morbidly obese HEENT: NCAT, PERRL, EOMI, sclera clear, MMM PULM: Breath sounds slightly diminished bases, no wheezing/crackles, normal respiratory effort without accessory muscle use, on 2 L nasal cannula with SPO2 96% at rest CV: RRR w/o M/G/R GI: abd soft, NTND, NABS, no R/G/M MSK: Left upper extremity in long splint, neurovascular intact, no peripheral edema, otherwise muscle strength preserved right upper/lower extremity and left lower extremity NEURO: CN II-XII intact, no focal deficits, sensation to light touch intact PSYCH: normal mood/affect Integumentary: dry/intact, no rashes or wounds    Data Reviewed: I have personally reviewed following labs and imaging studies  CBC: Recent Labs  Lab 08/23/21 0114 08/23/21 0122 08/24/21 0239 08/24/21 2209 08/25/21 0058  WBC 12.6*  --  12.5* 12.5* 10.9*  HGB 14.7 14.3  13.2 12.7* 12.2*  HCT 43.3 42.0 39.7 36.6* 36.7*  MCV 94.5  --  94.3 94.1 94.3  PLT 193  --  170 169 164   Basic Metabolic Panel: Recent Labs  Lab 08/23/21 0114 08/23/21 0122 08/24/21 0239 08/24/21 2209 08/25/21 0058  NA 139 138 136  136 137  K 4.1 4.0 3.9 3.9 3.7  CL 107 107 104 100 102  CO2 22  --  GLUCOSE 194* 195* 183* 164* 177*  BUN 21 25* CREATININE 0.81 0.70 0.79 1.01 0.96  CALCIUM 8.7*  --  8.2* 8.3* 8.4*  MG  --   --   --   --  2.2   GFR: Estimated Creatinine Clearance: 117 mL/min (by C-G formula based on SCr of 0.96 mg/dL). Liver Function Tests: Recent Labs  Lab 08/23/21 0114 08/24/21 2209  AST 40 18  ALT 33 20  ALKPHOS 49 44  BILITOT 0.6 0.9  PROT 5.8* 5.5*  ALBUMIN 3.3* 2.8*   No results for input(s): "LIPASE", "AMYLASE" in the last 168 hours. No results for input(s): "AMMONIA" in the last 168 hours. Coagulation Profile: Recent Labs  Lab 08/23/21 0114  INR 1.0   Cardiac Enzymes: Recent Labs  Lab 08/23/21 1714  CKTOTAL 252   BNP (last 3 results) No results for input(s): "PROBNP" in the last 8760 hours. HbA1C: Recent Labs    08/23/21 1853  HGBA1C 7.5*   CBG: No results for input(s): "GLUCAP" in the last 168 hours. Lipid Profile: No results for input(s): "CHOL", "HDL", "LDLCALC", "TRIG", "CHOLHDL", "LDLDIRECT" in the last 72 hours. Thyroid Function Tests: No results for input(s): "TSH", "T4TOTAL", "FREET4", "T3FREE", "THYROIDAB" in the last 72 hours. Anemia Panel: No results for input(s): "VITAMINB12", "FOLATE", "FERRITIN", "TIBC", "IRON", "RETICCTPCT" in the last 72 hours. Sepsis Labs: Recent Labs  Lab 08/23/21 0114 08/23/21 1714 08/24/21 2209 08/25/21 0058  PROCALCITON  --   --  <0.10  --   LATICACIDVEN 2.0* 1.6 1.2 0.9    Recent Results (from the past 240 hour(s))  Resp Panel by RT-PCR (Flu A&B, Covid) Anterior Nasal Swab     Status: None   Collection Time: 08/23/21  1:30 AM   Specimen: Anterior Nasal Swab  Result Value Ref Range Status   SARS Coronavirus 2 by RT PCR NEGATIVE NEGATIVE Final    Comment: (NOTE) SARS-CoV-2 target nucleic acids are NOT DETECTED.  The SARS-CoV-2 RNA is generally detectable in upper respiratory specimens during  the acute phase of infection. The lowest concentration of SARS-CoV-2 viral copies this assay can detect is 138 copies/mL. A negative result does not preclude SARS-Cov-2 infection and should not be used as the sole basis for treatment or other patient management decisions. A negative result may occur with  improper specimen collection/handling, submission of specimen other than nasopharyngeal swab, presence of viral mutation(s) within the areas targeted by this assay, and inadequate number of viral copies(<138 copies/mL). A negative result must be combined with clinical observations, patient history, and epidemiological information. The expected result is Negative.  Fact Sheet for Patients:  BloggerCourse.com  Fact Sheet for Healthcare Providers:  SeriousBroker.it  This test is no t yet approved or cleared by the [place] FDA and  has been authorized for detection and/or diagnosis of SARS-CoV-2 by FDA under an Emergency Use Authorization (EUA). This EUA will remain  in effect (meaning this test can be used) for the duration of the COVID-19 declaration under Section 564(b)(1) of the Act,  21 U.S.C.section 360bbb-3(b)(1), unless the authorization is terminated  or revoked sooner.       Influenza A by PCR NEGATIVE NEGATIVE Final   Influenza B by PCR NEGATIVE NEGATIVE Final    Comment: (NOTE) The Xpert Xpress SARS-CoV-2/FLU/RSV plus assay is intended as an aid in the diagnosis of influenza from Nasopharyngeal swab specimens and should not be used as a sole basis for treatment. Nasal washings and aspirates are unacceptable for Xpert Xpress SARS-CoV-2/FLU/RSV testing.  Fact Sheet for Patients: BloggerCourse.com  Fact Sheet for Healthcare Providers: SeriousBroker.it  This test is not yet approved or cleared by the [place] FDA and has been authorized for detection and/or  diagnosis of SARS-CoV-2 by FDA under an Emergency Use Authorization (EUA). This EUA will remain in effect (meaning this test can be used) for the duration of the COVID-19 declaration under Section 564(b)(1) of the Act, 21 U.S.C. section 360bbb-3(b)(1), unless the authorization is terminated or revoked.  Performed at [facility], [address], [place], [place] [address]   Surgical pcr screen     Status: Abnormal   Collection Time: 08/24/21 10:54 PM   Specimen: Nasal Mucosa; Nasal Swab  Result Value Ref Range Status   MRSA, PCR NEGATIVE NEGATIVE Final   Staphylococcus aureus POSITIVE (A) NEGATIVE Final    Comment: (NOTE) The Xpert SA Assay (FDA approved for NASAL specimens in patients 70 years of age and older), is one component of a comprehensive surveillance program. It is not intended to diagnose infection nor to guide or monitor treatment. Performed at [facility], [address], [place], [place] [address]          Radiology Studies: ECHOCARDIOGRAM COMPLETE  Result Date: 08/24/2021    ECHOCARDIOGRAM REPORT   Patient Name:   Mathew Oliver Date of Exam: 08/24/2021 Medical Rec #:  [id number]        Height:       72.0 in Accession #:    [id number]       Weight:       308.0 lb Date of Birth:  Feb 23, 1961        BSA:          2.560 m Patient Age:    60 years         BP:           116/76 mmHg Patient Gender: M                HR:           75 bpm. Exam Location:  Inpatient Procedure: 2D Echo, Cardiac Doppler, Color Doppler and Intracardiac            Opacification Agent Indications:    Abnormal EKG  History:        Patient has prior history of Echocardiogram examinations.                 Abnormal ECG. Larey Seat off a ladder and broke his elbow.  Sonographer:    Roosvelt Maser RDCS Referring Phys: [id number] RONDELL A SMITH IMPRESSIONS  1. Left ventricular ejection fraction, by estimation, is 40 to 45%. The left ventricle has mildly decreased function. The left ventricle demonstrates  regional wall motion abnormalities (see scoring diagram/findings for description). The left ventricular  internal cavity size was mildly dilated. There is mild concentric left ventricular hypertrophy. Left ventricular diastolic parameters are consistent with Grade II diastolic dysfunction (pseudonormalization). There is hypokinesis of the left ventricular,  basal-mid inferoseptal  wall and inferior wall.  2. Right ventricular systolic function is normal. The right ventricular size is mildly enlarged. Tricuspid regurgitation signal is inadequate for assessing PA pressure.  3. Left atrial size was moderately dilated.  4. Right atrial size was mildly dilated.  5. The mitral valve was not well visualized. Trivial mitral valve regurgitation. No evidence of mitral stenosis.  6. The aortic valve was not well visualized. Aortic valve regurgitation is not visualized. No aortic stenosis is present. Comparison(s): Prior images unable to be directly viewed, comparison made by report only. No significant change from prior study. [facility] echo 08/21/19: EF 40-45%, inferolateral hypokinesis. Conclusion(s)/Recommendation(s): Technically challenging images, even with use of echo contrast. EF and wall motion as noted, no significant valve disease by doppler. FINDINGS  Left Ventricle: Left ventricular ejection fraction, by estimation, is 40 to 45%. The left ventricle has mildly decreased function. The left ventricle demonstrates regional wall motion abnormalities. Definity contrast agent was given IV to delineate the left ventricular endocardial borders. The left ventricular internal cavity size was mildly dilated. There is mild concentric left ventricular hypertrophy. Left ventricular diastolic parameters are consistent with Grade II diastolic dysfunction (pseudonormalization). Right Ventricle: The right ventricular size is mildly enlarged. Right vetricular wall thickness was not well visualized. Right ventricular systolic function is  normal. Tricuspid regurgitation signal is inadequate for assessing PA pressure. Left Atrium: Left atrial size was moderately dilated. Right Atrium: Right atrial size was mildly dilated. Pericardium: There is no evidence of pericardial effusion. Mitral Valve: The mitral valve was not well visualized. Trivial mitral valve regurgitation. No evidence of mitral valve stenosis. Tricuspid Valve: The tricuspid valve is not well visualized. Tricuspid valve regurgitation is trivial. No evidence of tricuspid stenosis. Aortic Valve: The aortic valve was not well visualized. Aortic valve regurgitation is not visualized. No aortic stenosis is present. Aortic valve mean gradient measures 4.0 mmHg. Aortic valve peak gradient measures 6.5 mmHg. Aortic valve area, by VTI measures 2.24 cm. Pulmonic Valve: The pulmonic valve was not well visualized. Pulmonic valve regurgitation is not visualized. Aorta: The aortic root was not well visualized and the ascending aorta was not well visualized. IAS/Shunts: The interatrial septum was not well visualized.  LEFT VENTRICLE PLAX 2D LVIDd:         5.25 cm   Diastology LVIDs:         3.99 cm   LV e' medial:    4.95 cm/s LV PW:         1.40 cm   LV E/e' medial:  13.6 LV IVS:        1.30 cm   LV e' lateral:   12.60 cm/s LVOT diam:     2.10 cm   LV E/e' lateral: 5.3 LV SV:         50 LV SV Index:   19 LVOT Area:     3.46 cm  RIGHT VENTRICLE RV Basal diam:  4.20 cm RV Mid diam:    5.00 cm RV S prime:     12.10 cm/s TAPSE (M-mode): 2.6 cm LEFT ATRIUM              Index        RIGHT ATRIUM           Index LA diam:        6.20 cm  2.42 cm/m   RA Area:     23.10 cm LA Vol (A2C):   120.0 ml 46.88 ml/m  RA Volume:   63.40 ml  24.77 ml/m LA Vol (A4C):   115.0 ml 44.93 ml/m LA Biplane Vol: 125.0 ml 48.83 ml/m  AORTIC VALVE AV Area (Vmax):    2.15 cm AV Area (Vmean):   2.02 cm AV Area (VTI):     2.24 cm AV Vmax:           127.00 cm/s AV Vmean:          88.400 cm/s AV VTI:            0.221 m AV Peak  Grad:      6.5 mmHg AV Mean Grad:      4.0 mmHg LVOT Vmax:         79.00 cm/s LVOT Vmean:        51.600 cm/s LVOT VTI:          0.143 m LVOT/AV VTI ratio: 0.65  AORTA Ao Root diam: 3.90 cm Ao Asc diam:  3.70 cm MITRAL VALVE MV Area (PHT): 4.06 cm    SHUNTS MV Decel Time: 187 msec    Systemic VTI:  0.14 m MV E velocity: 67.40 cm/s  Systemic Diam: 2.10 cm MV A velocity: 81.30 cm/s MV E/A ratio:  0.83 Jodelle RedBridgette Christopher MD Electronically signed by Jodelle RedBridgette Christopher MD Signature Date/Time: 08/24/2021/3:38:56 PM    Final         Scheduled Meds:  [WUJ[MAR Hold] aspirin EC  325 mg Oral QHS   [MAR Hold] atorvastatin  40 mg Oral QPM   [MAR Hold] Chlorhexidine Gluconate Cloth  6 each Topical Daily   [MAR Hold] enoxaparin (LOVENOX) injection  70 mg Subcutaneous Q24H   [MAR Hold] mupirocin ointment  1 application  Nasal BID   [MAR Hold] sodium chloride flush  3 mL Intravenous Q12H   Continuous Infusions:  [MAR Hold]  ceFAZolin (ANCEF) IV 2 g (08/25/21 0309)   lactated ringers 10 mL/hr at 08/25/21 1025     LOS: 2 days    Time spent: 48 minutes spent on chart review, discussion with nursing staff, consultants, updating family and interview/physical exam; more than 50% of that time was spent in counseling and/or coordination of care.    Alvira PhilipsEric J [place], DO [facility] Available via Epic secure chat 7am-7pm After these hours, please refer to coverage provider listed on [email or web address] 08/25/2021, 12:10 PM .

## 2021-08-25 NOTE — TOC Progression Note (Signed)
Transition of Care [facility]) - Progression Note    Patient Details  Name: Mathew Oliver MRN: [id number] Date of Birth: October 16, 1960  [facility] CM/SW Contact  Bartholomew Crews, RN Phone Number: [phone] 08/25/2021, 2:55 PM  Clinical Narrative:     Received call from Danae Chen, telephonic [facility] for [place]. Advised that field nurse should be assigned today. Provided TOC and CIR contact information.    Barriers to Discharge: Continued Medical Work up  Expected Discharge Plan and Services     Discharge Planning Services: CM Consult   Living arrangements for the past 2 months: Single Family Home                                       Social Determinants of Health (SDOH) Interventions    Readmission Risk Interventions     No data to display

## 2021-08-25 NOTE — Interval H&P Note (Signed)
History and Physical Interval Note:  08/25/2021 11:31 AM  Mathew Oliver  has presented today for surgery, with the diagnosis of Left distal humerus fracture.  The various methods of treatment have been discussed with the patient and family. After consideration of risks, benefits and other options for treatment, the patient has consented to  Procedure(s): OPEN REDUCTION INTERNAL FIXATION (ORIF) DISTAL HUMERUS FRACTURE (Left) as a surgical intervention.  The patient's history has been reviewed, patient examined, no change in status, stable for surgery.  I have reviewed the patient's chart and labs.  Questions were answered to the patient's satisfaction.     Lennette Bihari P Tashai Catino

## 2021-08-25 NOTE — Plan of Care (Signed)
  Problem: Education: Goal: Knowledge of General Education information will improve Description: Including pain rating scale, medication(s)/side effects and non-pharmacologic comfort measures 08/25/2021 0128 by Carylon Perches, LPN Outcome: Progressing 08/24/2021 2305 by Carylon Perches, LPN Outcome: Progressing   Problem: Health Behavior/Discharge Planning: Goal: Ability to manage health-related needs will improve 08/25/2021 0128 by Carylon Perches, LPN Outcome: Progressing 08/24/2021 2305 by Carylon Perches, LPN Outcome: Progressing   Problem: Clinical Measurements: Goal: Ability to maintain clinical measurements within normal limits will improve 08/25/2021 0128 by Carylon Perches, LPN Outcome: Progressing 08/24/2021 2305 by Carylon Perches, LPN Outcome: Progressing Goal: Will remain free from infection 08/25/2021 0128 by Carylon Perches, LPN Outcome: Progressing 08/24/2021 2305 by Carylon Perches, LPN Outcome: Progressing Goal: Diagnostic test results will improve 08/25/2021 0128 by Carylon Perches, LPN Outcome: Progressing 08/24/2021 2305 by Carylon Perches, LPN Outcome: Progressing Goal: Respiratory complications will improve 08/25/2021 0128 by Carylon Perches, LPN Outcome: Progressing 08/24/2021 2305 by Carylon Perches, LPN Outcome: Progressing Goal: Cardiovascular complication will be avoided 08/25/2021 0128 by Carylon Perches, LPN Outcome: Progressing 08/24/2021 2305 by Carylon Perches, LPN Outcome: Progressing   Problem: Activity: Goal: Risk for activity intolerance will decrease 08/25/2021 0128 by Carylon Perches, LPN Outcome: Progressing 08/24/2021 2305 by Carylon Perches, LPN Outcome: Progressing   Problem: Nutrition: Goal: Adequate nutrition will be maintained 08/25/2021 0128 by Carylon Perches, LPN Outcome: Progressing 08/24/2021 2305 by Carylon Perches, LPN Outcome: Progressing   Problem: Coping: Goal: Level of anxiety will  decrease 08/25/2021 0128 by Carylon Perches, LPN Outcome: Progressing 08/24/2021 2305 by Carylon Perches, LPN Outcome: Progressing   Problem: Elimination: Goal: Will not experience complications related to bowel motility 08/25/2021 0128 by Carylon Perches, LPN Outcome: Progressing 08/24/2021 2305 by Carylon Perches, LPN Outcome: Progressing Goal: Will not experience complications related to urinary retention 08/25/2021 0128 by Carylon Perches, LPN Outcome: Progressing 08/24/2021 2305 by Carylon Perches, LPN Outcome: Progressing   Problem: Pain Managment: Goal: General experience of comfort will improve 08/25/2021 0128 by Carylon Perches, LPN Outcome: Progressing 08/24/2021 2305 by Carylon Perches, LPN Outcome: Progressing   Problem: Safety: Goal: Ability to remain free from injury will improve 08/25/2021 0128 by Carylon Perches, LPN Outcome: Progressing 08/24/2021 2305 by Carylon Perches, LPN Outcome: Progressing   Problem: Safety: Goal: Ability to remain free from injury will improve 08/25/2021 0128 by Carylon Perches, LPN Outcome: Progressing 08/24/2021 2305 by Carylon Perches, LPN Outcome: Progressing

## 2021-08-25 NOTE — Progress Notes (Signed)
Inpatient Rehabilitation Admissions Coordinator   I met with patient's wife at bedside. Patient is in OR. I discussed a possible CIR admit . I will follow up with workers compensation contact she provided me.  Danne Baxter, RN, MSN Rehab Admissions Coordinator [phone] 08/25/2021 12:02 PM

## 2021-08-25 NOTE — Op Note (Signed)
Orthopaedic Surgery Operative Note (CSN: [id number] ) Date of Surgery: 08/25/2021  Admit Date: 08/23/2021   Diagnoses: Pre-Op Diagnoses: Left closed intra-articular distal humerus fracture  Post-Op Diagnosis: Left open intra-articular distal humerus fracture  Procedures: CPT 24546-Open reduction internal fixation of left distal humerus fracture CPT 24342-Primary repair of partial triceps tendon tear CPT 25360-Ulnar osteotomy CPT 11012-Irrigation and debridement of left open distal humerus fracture  Surgeons : Primary: Shona Needles, MD  Assistant: Patrecia Pace, PA-C  Location: OR 3   Anesthesia:General   Antibiotics: Ancef 2g preop with 1 gm vancomycin powder placed topically   Tourniquet time: None used    Estimated Blood Loss: A999333 mL  Complications:None   Specimens:None   Implants: Implant Name Type Inv. Item Serial No. Manufacturer Lot No. LRB No. Used Action  Cannulated screw  Screw   [employer]  Left 1 Implanted  WASHER CANN 12.7 STRL - [id number] Washer WASHER CANN 12.7 STRL  [employer]  Left 1 Implanted  PLATE HUM EVOS 6H L [id number] - [id number] Plate PLATE HUM EVOS 6H L QA348G  [employer]TH[employer]  Left 1 Implanted  SCREW CORT 3.5X22 ST EVOS - [id number] Screw SCREW CORT 3.5X22 ST EVOS  [employer]  Left 2 Implanted  SCREW CORT VA EVOS 2.7X70 - [id number] Screw SCREW CORT VA EVOS 2.7X70  [employer]TH[employer]  Left 1 Implanted  SCREW LOCK ST EVOS 2.7X22 - [id number] Screw SCREW LOCK ST EVOS 2.7X22  [employer]TH[employer]  Left 3 Implanted  SCREW LOCK ST EVOS 2.7X24 - [id number] Screw SCREW LOCK ST EVOS 2.7X24  [employer]  Left 1 Implanted  PLATE HUM EVOS 3H L QA348G - [id number] Plate PLATE HUM EVOS 3H L QA348G  [employer]  Left 1 Implanted  SCREW CORT 3.5X30 ST EVOS - [id number] Screw SCREW CORT 3.5X30 ST EVOS  [employer]  Left 1 Implanted  SCREW  CORTEX 3.5X24MM - [id number] Screw SCREW CORTEX 3.5X24MM  [employer]  Left 1 Implanted  SCREW LOCK 2.7X30 [id number] Screw SCREW LOCK 2.7X30  [employer]  Left 1 Implanted  SCREW LOCK ST EVOS 2.7X65 [id number] Screw SCREW LOCK ST EVOS 2.7X65  [employer]  Left 1 Implanted  SCREW LOCK EVOS 2.7X32 [id number] Screw SCREW LOCK EVOS 2.7X32  [employer]  Left 1 Implanted  SCREW CORTEX 3.5X18 EVOS [id number] Screw SCREW CORTEX 3.5X18 EVOS  [employer]TH[employer]  Left 1 Implanted     Indications for Surgery: 61 year old male who sustained a fall from a ladder and a left intra-articular distal humerus fracture.  Due to the unstable nature of his injury I recommended proceeding with open reduction internal fixation.  Risks and benefits were discussed with the patient.  Risks included but not limited to bleeding, infection, malunion, nonunion, hardware failure, hardware irritation, nerve or blood vessel injury, posttraumatic arthritis, elbow stiffness, even the possibility anesthetic complications.  He agreed to proceed with surgery and consent was obtained.  Operative Findings: 1.  Open reduction internal fixation of left open intra-articular distal humerus fracture with Smith [employer] Nephew EVOS posterior lateral and direct medial distal humeral plates 2.  Significant trochlear comminution with joint that was in 5 osteochondral fragments with provisional osteochondral fragment fixation using a 1.25 mm K wires 3.  Olecranon osteotomy to access the articular surface fixed with a 6.5 mm Tamala Julian &  Nephew cannulated screw with a washer 4.  Vertical tear in the lateral portion of the triceps tendon treated with primary repair using 0 Vicryl suture.  Insertion was still attached to the olecranon. 5.  Irrigation and debridement of left open distal humeral fracture  Procedure: The patient was identified in the preoperative holding area. Consent  was confirmed with the patient and their family and all questions were answered. The operative extremity was marked after confirmation with the patient. he was then brought back to the operating room by our anesthesia colleagues.  He was carefully transferred over to radiolucent flat top table.  He was placed under general anesthetic.  He was then placed in the lateral decubitus position with his left side up.  All bony prominences were well-padded.  An axillary roll was placed to keep pressure off of his neurovascular structures on his down arm.  A beanbag was used to position him.  The left upper extremity was then positioned appropriately and with manipulation there was a small amount of bleeding through one of the abrasions on the posterior aspect of his arm most likely consistent with an open fracture.  The left upper extremity was then prepped and draped in usual sterile fashion.  A timeout was performed to verify the patient, the procedure, and the extremity.  Preoperative antibiotics were dosed.  Fluoroscopic imaging was obtained to show the unstable nature of his injury.  A posterior approach was made and carried down through skin and subcutaneous tissue.  Here I encountered a tear in the triceps tendon laterally based that extended down to the fracture which confirmed that the fracture was open.  There was no contamination.  I did perform irrigation of the open fracture at this point.  I extended the incision proximally and distally.  I mobilized the triceps fascia and exposed the lateral and medial intermuscular septums.  I carefully dissected the lateral intermuscular septum and extended it down into the anconeus and exposed the distal portion of the joint.  The process was repeated medially where I explored and I dissected out the ulnar nerve and freed this from the cubital tunnel.  I protected this throughout the case.  And then I exposed the medial aspect of the distal humerus.  At this point by  exposing the distal fracture I saw free osteochondral fragments.  In total 5 that were comminuted and they were not attached to either the medial or lateral condyle.  I took these and placed them in saline on the back table and continue my approach.  I then prepped the ulna for an osteotomy.  Using fluoroscopic imaging I advanced a threaded guidewire for the 6.5 mm cannulated screws down the center of the canal of the ulna.  I drilled and then placed 100 mm 6.5 mm cannulated screw to create a path.  I then removed it and proceeded to create an osteotomy of the ulna and a reverse chevron shape.  I used a oscillating saw to start the osteotomy and completed it with an osteotome.  I then mobilized the triceps off of the posterior aspect of the humerus and was able to visualize articular surface.  There were some relatively decent cortical reads both medially and laterally along the metaphysis that allow for some semblance of reduction.  However the significant comminution of the articular surface in the trochlea made the articular surface reduction very difficult.  I was able to piece the articular fragments back to the medial condyle and the articular  surface of the trochlea I was able to get the larger fragments in place and hold them provisionally with a 1.25 millimeter K wires.  I felt that there were no other way to provisionally stabilize these and reduce the medial condyle to the lateral condyle so I cut these flush to the bone made sure they were all intraosseous.  I then reduced the medial condyle to the lateral condyle and was able to get a decent articular reduction.  I confirmed this with fluoroscopy and held it with a reduction tenaculum.  I then turned my attention to the reduction of the metaphyseal columns.  I was able to reduce the lateral column with a reduction tenaculum and confirmed adequate placement with fluoroscopic imaging.  I then did the same with the medial column.  Unfortunately with a  lateral view which showed that the fracture was in some hyperextension.  I felt that this could be corrected with the posterior lateral plate.  So this was positioned appropriately distally and I then drilled and placed a nonlocking screw into the humeral shaft to bring the plate flush to bone.  By doing this it corrected the hyperextension deformity of the distal humerus and created an anatomic reduction.  I then proceeded to drill and placed locking screws in the capitellum.  I did place a 2.7 mm nonlocking screw from the lateral condyle to the medial condyle to hold the articular reduction.   The medial condyle and column was then reduced to the humeral shaft using a reduction tenaculum.  I confirmed adequate reduction with fluoroscopy and then used a direct medial plate and positioned this with a K wire.  I confirmed positioning and then drilled and placed nonlocking screws into the humeral shaft.  I then placed locking screws into the distal articular segment.  I then completed some nonlocking screws of both lateral and medial plate.  This completed the distal humeral construct.  I then reduced the olecranon back in position and held it with a reduction tenaculum and then placed my 6.5 mm cannulated screw with a washer to compress the osteotomy site.  Excellent fixation was obtained.  Final fluoroscopic imaging was then obtained.  The incision was irrigated thoroughly.  A gram of vancomycin powder was placed into the incision.  The triceps tendon was repaired with 0 Vicryl suture.  The skin was closed with 0 Vicryl, 2-0 Vicryl and 3-0 nylon suture.  Sterile dressing consisting of Mepitel, 4 x 4's, sterile cast padding and Ace wrap was placed.  The patient was then awoken from anesthesia and taken to the PACU in stable condition.   Debridement type: Excisional Debridement  Side: left  Body Location: Distal humerus  Tools used for debridement: scalpel, scissors, curette, and rongeur  Pre-debridement  Wound size (cm):   Length: 0.5        Width: 0.1     Depth: 0   Post-debridement Wound size (cm):   N/A-closed  Debridement depth beyond dead/damaged tissue down to healthy viable tissue: yes  Tissue layer involved: skin, subcutaneous tissue, muscle / fascia, bone  Nature of tissue removed: Devitalized Tissue  Irrigation volume: 2L     Irrigation fluid type: Normal Saline  Post Op Plan/Instructions: Patient will be nonweightbearing to the left upper extremity.  He will have unrestricted range of motion of the left elbow.  He will receive postoperative Ancef.  He will receive Lovenox for DVT prophylaxis and likely discharge on a DOAC.  We will have him mobilize  with physical and Occupational Therapy.  I was present and performed the entire surgery.  Patrecia Pace, PA-C did assist me throughout the case. An assistant was necessary given the difficulty in approach, maintenance of reduction and ability to instrument the fracture.   Katha Hamming, MD [facility]

## 2021-08-25 NOTE — Progress Notes (Signed)
Inpatient Rehabilitation Admissions Coordinator   I spoke with Alcario Drought with workers compensation. Field rep assignment pending. I will provide postoperative therapy assessments once available to begin Auth for possible CIR admit.  Ottie Glazier, RN, MSN Rehab Admissions Coordinator [phone] 08/25/2021 4:11 PM

## 2021-08-25 NOTE — Progress Notes (Signed)
Pt. Off unit to OR.

## 2021-08-25 NOTE — Anesthesia Preprocedure Evaluation (Signed)
Anesthesia Evaluation  Patient identified by MRN, date of birth, ID band Patient awake    Reviewed: Allergy & Precautions, NPO status , Patient's Chart, lab work & pertinent test results, reviewed documented beta blocker date and time   History of Anesthesia Complications Negative for: history of anesthetic complications  Airway Mallampati: III  TM Distance: >3 FB Neck ROM: Full    Dental  (+) Missing, Poor Dentition, Dental Advisory Given   Pulmonary neg shortness of breath, neg sleep apnea, neg COPD, neg recent URI, Current Smoker and Patient abstained from smoking.,    breath sounds clear to auscultation       Cardiovascular hypertension, Pt. on medications and Pt. on home beta blockers (-) angina+ CAD, + Past MI, + Cardiac Stents and +CHF   Rhythm:Regular  1. Left ventricular ejection fraction, by estimation, is 40 to 45%. The  left ventricle has mildly decreased function. The left ventricle  demonstrates regional wall motion abnormalities (see scoring  diagram/findings for description). The left ventricular  internal cavity size was mildly dilated. There is mild concentric left  ventricular hypertrophy. Left ventricular diastolic parameters are  consistent with Grade II diastolic dysfunction (pseudonormalization).  There is hypokinesis of the left ventricular,  basal-mid inferoseptal wall and inferior wall.  2. Right ventricular systolic function is normal. The right ventricular  size is mildly enlarged. Tricuspid regurgitation signal is inadequate for  assessing PA pressure.  3. Left atrial size was moderately dilated.  4. Right atrial size was mildly dilated.  5. The mitral valve was not well visualized. Trivial mitral valve  regurgitation. No evidence of mitral stenosis.  6. The aortic valve was not well visualized. Aortic valve regurgitation  is not visualized. No aortic stenosis is present.     Neuro/Psych negative neurological ROS  negative psych ROS   GI/Hepatic negative GI ROS, Neg liver ROS,   Endo/Other  Morbid obesity  Renal/GU negative Renal ROS     Musculoskeletal Left distal humerus fracture   Abdominal   Peds  Hematology Lab Results      Component                Value               Date                      WBC                      10.9 (H)            08/25/2021                HGB                      12.2 (L)            08/25/2021                HCT                      36.7 (L)            08/25/2021                MCV                      94.3                08/25/2021  PLT                      164                 08/25/2021              Anesthesia Other Findings   Reproductive/Obstetrics                             Anesthesia Physical Anesthesia Plan  ASA: 3  Anesthesia Plan: General   Post-op Pain Management: Tylenol PO (pre-op)*   Induction: Intravenous  PONV Risk Score and Plan: 1 and Ondansetron and Dexamethasone  Airway Management Planned: Oral ETT  Additional Equipment:   Intra-op Plan:   Post-operative Plan: Extubation in OR  Informed Consent: I have reviewed the patients History and Physical, chart, labs and discussed the procedure including the risks, benefits and alternatives for the proposed anesthesia with the patient or authorized representative who has indicated his/her understanding and acceptance.     Dental advisory given  Plan Discussed with: CRNA  Anesthesia Plan Comments:         Anesthesia Quick Evaluation

## 2021-08-26 ENCOUNTER — Encounter (HOSPITAL_COMMUNITY): Payer: Self-pay | Admitting: Student

## 2021-08-26 DIAGNOSIS — E559 Vitamin D deficiency, unspecified: Secondary | ICD-10-CM

## 2021-08-26 DIAGNOSIS — W11XXXA Fall on and from ladder, initial encounter: Secondary | ICD-10-CM | POA: Diagnosis not present

## 2021-08-26 DIAGNOSIS — S42402B Unspecified fracture of lower end of left humerus, initial encounter for open fracture: Principal | ICD-10-CM

## 2021-08-26 DIAGNOSIS — I10 Essential (primary) hypertension: Secondary | ICD-10-CM | POA: Diagnosis not present

## 2021-08-26 DIAGNOSIS — I2510 Atherosclerotic heart disease of native coronary artery without angina pectoris: Secondary | ICD-10-CM | POA: Diagnosis not present

## 2021-08-26 LAB — CBC
HCT: 38.5 % — ABNORMAL LOW (ref 39.0–52.0)
Hemoglobin: 12.9 g/dL — ABNORMAL LOW (ref 13.0–17.0)
MCH: 31.5 pg (ref 26.0–34.0)
MCHC: 33.5 g/dL (ref 30.0–36.0)
MCV: 93.9 fL (ref 80.0–100.0)
Platelets: 198 10*3/uL (ref 150–400)
RBC: 4.1 MIL/uL — ABNORMAL LOW (ref 4.22–5.81)
RDW: 12.2 % (ref 11.5–15.5)
WBC: 13.5 10*3/uL — ABNORMAL HIGH (ref 4.0–10.5)
nRBC: 0 % (ref 0.0–0.2)

## 2021-08-26 LAB — MAGNESIUM: Magnesium: 2.2 mg/dL (ref 1.7–2.4)

## 2021-08-26 LAB — BASIC METABOLIC PANEL
Anion gap: 7 (ref 5–15)
BUN: 14 mg/dL (ref 8–23)
CO2: 27 mmol/L (ref 22–32)
Calcium: 8.3 mg/dL — ABNORMAL LOW (ref 8.9–10.3)
Chloride: 102 mmol/L (ref 98–111)
Creatinine, Ser: 0.64 mg/dL (ref 0.61–1.24)
GFR, Estimated: >60 mL/min (ref >60)
Glucose, Bld: 186 mg/dL — ABNORMAL HIGH (ref 70–99)
Potassium: 4.2 mmol/L (ref 3.5–5.1)
Sodium: 136 mmol/L (ref 135–145)

## 2021-08-26 LAB — VITAMIN D 25 HYDROXY (VIT D DEFICIENCY, FRACTURES): Vit D, 25-Hydroxy: 20.84 ng/mL — ABNORMAL LOW (ref 30–100)

## 2021-08-26 MED ORDER — CARVEDILOL 25 MG PO TABS
25.0000 mg | ORAL_TABLET | Freq: Two times a day (BID) | ORAL | Status: DC
  Administered 2021-08-26 – 2021-08-31 (×10): 25 mg via ORAL
  Filled 2021-08-26 (×10): qty 1

## 2021-08-26 MED ORDER — VITAMIN D (ERGOCALCIFEROL) 1.25 MG (50000 UNIT) PO CAPS
50000.0000 [IU] | ORAL_CAPSULE | ORAL | Status: DC
  Administered 2021-08-26: 50000 [IU] via ORAL
  Filled 2021-08-26: qty 1

## 2021-08-26 MED ORDER — FUROSEMIDE 10 MG/ML IJ SOLN
40.0000 mg | Freq: Once | INTRAMUSCULAR | Status: AC
  Administered 2021-08-26: 40 mg via INTRAVENOUS
  Filled 2021-08-26: qty 4

## 2021-08-26 MED ORDER — SPIRONOLACTONE 25 MG PO TABS
25.0000 mg | ORAL_TABLET | Freq: Every evening | ORAL | Status: DC
  Administered 2021-08-27 – 2021-08-30 (×4): 25 mg via ORAL
  Filled 2021-08-26 (×4): qty 1

## 2021-08-26 MED ORDER — LISINOPRIL 20 MG PO TABS
40.0000 mg | ORAL_TABLET | Freq: Every evening | ORAL | Status: DC
  Administered 2021-08-26 – 2021-08-30 (×5): 40 mg via ORAL
  Filled 2021-08-26 (×5): qty 2

## 2021-08-26 MED ORDER — VITAMIN D 25 MCG (1000 UNIT) PO TABS
1000.0000 [IU] | ORAL_TABLET | Freq: Every day | ORAL | Status: DC
  Administered 2021-08-26 – 2021-08-31 (×6): 1000 [IU] via ORAL
  Filled 2021-08-26 (×6): qty 1

## 2021-08-26 NOTE — Plan of Care (Signed)

## 2021-08-26 NOTE — Progress Notes (Addendum)
Orthopaedic Trauma Progress Note  SUBJECTIVE: Doing okay this morning.  Elbow feels better than it did preoperatively.  Notes that he has some occasional throbbing pain in the elbow but overall pain is well controlled on current medication regimen.  Has been trying to move the arm some on his own overnight.  Denies any numbness or tingling throughout the extremity.  No chest pain. No SOB. No nausea/vomiting. No other complaints.   OBJECTIVE:  Vitals:   08/26/21 0411 08/26/21 0741  BP: (!) 146/93   Pulse: 85   Resp: 15   Temp: 97.8 F (36.6 C) 98 F (36.7 C)  SpO2: 97%     General: Laying in bed comfortably, no acute distress Respiratory: No increased work of breathing.  Left upper extremity: Sling in place.  Dressing clean, dry, intact.  Nontender above dressing or throughout the wrist and hand.  Does have some obvious tenderness over the elbow as expected.  Tolerates gentle, limited elbow motion.  Wrist flexion extension is intact.  Able to wiggle all fingers.  Able to make a full fist.  Motor and sensory function intact throughout median, ulnar, radial nerve distributions.  Hand warm and well-perfused.  Brisk cap refill  IMAGING: Stable post op imaging.   LABS:  Results for orders placed or performed during the hospital encounter of 08/23/21 (from the past 24 hour(s))  Basic metabolic panel     Status: Abnormal   Collection Time: 08/26/21  1:26 AM  Result Value Ref Range   Sodium 136 135 - 145 mmol/L   Potassium 4.2 3.5 - 5.1 mmol/L   Chloride 102 98 - 111 mmol/L   CO2 27 22 - 32 mmol/L   Glucose, Bld 186 (H) 70 - 99 mg/dL   BUN 14 8 - 23 mg/dL   Creatinine, Ser 0.64 0.61 - 1.24 mg/dL   Calcium 8.3 (L) 8.9 - 10.3 mg/dL   GFR, Estimated >60 >60 mL/min   Anion gap 7 5 - 15  CBC     Status: Abnormal   Collection Time: 08/26/21  1:26 AM  Result Value Ref Range   WBC 13.5 (H) 4.0 - 10.5 K/uL   RBC 4.10 (L) 4.22 - 5.81 MIL/uL   Hemoglobin 12.9 (L) 13.0 - 17.0 g/dL   HCT 38.5  (L) 39.0 - 52.0 %   MCV 93.9 80.0 - 100.0 fL   MCH 31.5 26.0 - 34.0 pg   MCHC 33.5 30.0 - 36.0 g/dL   RDW 12.2 11.5 - 15.5 %   Platelets 198 150 - 400 K/uL   nRBC 0.0 0.0 - 0.2 %  Magnesium     Status: None   Collection Time: 08/26/21  1:26 AM  Result Value Ref Range   Magnesium 2.2 1.7 - 2.4 mg/dL    ASSESSMENT: Mathew Oliver is a 61 y.o. male, 1 Day Post-Op s/p OPEN REDUCTION INTERNAL FIXATION LEFT DISTAL HUMERUS FRACTURE  CV/Blood loss: Hemoglobin 12.9 this morning, stable from preop.  Hemodynamically stable  PLAN: Weightbearing: NWB LUE ROM: Okay for unrestricted active/passive motion as tolerated Incisional and dressing care: Plan to change/remove dressing LUE 08/27/2021 Showering: Okay to begin showering getting incisions wet 08/28/2021 if no drainage from incisions Orthopedic device(s):  Sling for comfort   Pain management:  1. Tylenol 650 mg q 6 hours PRN 2. Robaxin 500 mg q 6 hours PRN 3. Percocet 5-325 mg (1-2 tabs) q 4 hours PRN 4. Dilaudid 0.5-1 mg q 4 hours PRN 5. Tramadol 50 mg q 6  hours scheduled VTE prophylaxis: Lovenox, SCDs ID:  Ancef 2gm post op Foley/Lines:  No foley, KVO IVFs Impediments to Fracture Healing: Vitamin D level pending, will start supplementation as indicated Dispo: PT/OT evaluation ongoing, currently recommending CIR.  Admission coordinator following.  Okay for discharge from ortho standpoint once cleared by medicine team and therapies D/C recommendations: - Percocet for pain control - Eliquis 2.5 mg twice daily x30 days for DVT prophylaxis - Possible need for Vit D supplementation  Follow - up plan: 2 weeks after discharge for wound check and repeat x-rays   Contact information:  Katha Hamming MD, Rushie Nyhan PA-C. After hours and holidays please check [email or web address] for group call information for [facility]   Gwinda Passe, PA-C [phone] (office) [email or web address]

## 2021-08-26 NOTE — TOC CAGE-AID Note (Signed)
Transition of Care [facility]) - CAGE-AID Screening   Patient Details  Name: Mathew Oliver MRN: [id number] Date of Birth: [birth year]  [facility] of [facility]) CM/SW Contact:    Salvator Seppala C Tarpley-Carter, LCSWA Phone Number: 08/26/2021, 11:57 AM   Clinical Narrative: Pt participated in Cage-Aid.  Pt stated he does smoke cigarettes.  Pt was offered resources, due to usage of substance.    Kourtni Stineman Tarpley-Carter, MSW, LCSW-A Pronouns:  She/Her/Hers [facility]itions of Care Clinical Social Worker Direct Number:  [phone] [email or web address]  CAGE-AID Screening: Substance Abuse Screening unable to be completed due to: : Patient unable to participate  Have You Ever Felt You Ought to Cut Down on Your Drinking or Drug Use?: No Have People Annoyed You By [employer] Your Drinking Or Drug Use?: No Have You Felt Bad Or Guilty About Your Drinking Or Drug Use?: No Have You Ever Had a Drink or Used Drugs First Thing In The Morning to Steady Your Nerves or to Get Rid of a Hangover?: No CAGE-AID Score: 0     Substance abuse interventions: Transport planner

## 2021-08-26 NOTE — Progress Notes (Signed)
Demographics, H&P  and clinicals given to workman compensation Case Production designer, theatre/television/film, Quentin Angst. [email or web address] ; ( c) [phone], (o) [phone], (fax) [phone]. Gae Gallop RN,BSN,CM

## 2021-08-26 NOTE — Progress Notes (Signed)
Occupational Therapy Treatment Patient Details Name: Mathew Oliver MRN: [id number] DOB: May 26, 1960 Today's Date: 08/26/2021   History of present illness 61 yo male presenting 6/10 after sustaining a fall off of a ladder with pelvic fractures including left superior, inferior pubic rami, and left sacrum. Pt also with comminuted L distal humerus fx.  ORIF left UE 6/12. PMH includes: tobacco use, MI.   OT comments  Pt with new ORIF L UE and session focused on exercises to complete and starting movement of L UE. Pt tolerating well and educated on edema management with pillows. Wife present for all education and pt return demonstrating for wife. Recommendation remains CIR. Pt up in chair after PT session for extended period of time and just returning to bed.    Recommendations for follow up therapy are one component of a multi-disciplinary discharge planning process, led by the attending physician.  Recommendations may be updated based on patient status, additional functional criteria and insurance authorization.    Follow Up Recommendations  Acute inpatient rehab (3hours/day)    Assistance Recommended at Discharge Intermittent Supervision/Assistance  Patient can return home with the following  Two people to help with walking and/or transfers;Two people to help with bathing/dressing/bathroom   Equipment Recommendations  BSC/3in1;Wheelchair (measurements OT);Wheelchair cushion (measurements OT)    Recommendations for Other Services Rehab consult    Precautions / Restrictions Precautions Precautions: Fall Required Braces or Orthoses: Sling Restrictions Weight Bearing Restrictions: Yes LUE Weight Bearing: Non weight bearing       Mobility Bed Mobility               General bed mobility comments: positioned with L UE on stacks of blankets and pillows for edema management    Transfers                         Balance                                            ADL either performed or assessed with clinical judgement   ADL                                         General ADL Comments: pt return back to bed just prior to arrival. pt reports sitting up for over one hour and half. pt feeling hot on arrival but agreeable to starting ROM of L UE. Pt provided 4 new batteries for [facility] hospital fans in the room    Extremity/Trunk Assessment Upper Extremity Assessment Upper Extremity Assessment: LUE deficits/detail LUE Deficits / Details: Pt tolerating removal of sling. pt tolerates AROM hand wrist with verbal cues. pt requires AAROM elbow flexion achieving 100-110 flexion and lacking 40 degrees from full extension of the elbow. pt aarom R UE under L UE with elbow flexion to achieve full shoulder flexion. Pt able to return demo and completed 3 sets of 15 during session with rest breaks. Pt reports changes in sensation on the dorsal side of the hand near the 5th mcp joint LUE Coordination: decreased fine motor   Lower Extremity Assessment Lower Extremity Assessment: Defer to PT evaluation        Vision       Perception     Praxis  Cognition Arousal/Alertness: Awake/alert Behavior During Therapy: WFL for tasks assessed/performed Overall Cognitive Status: Within Functional Limits for tasks assessed                                          Exercises      Shoulder Instructions       General Comments      Pertinent Vitals/ Pain       Pain Assessment Pain Assessment: Faces Faces Pain Scale: Hurts a little bit Pain Location: elbow Pain Descriptors / Indicators: Discomfort, Grimacing Pain Intervention(s): Monitored during session, Repositioned, Premedicated before session  Home Living                                          Prior Functioning/Environment              Frequency  Min 2X/week        Progress Toward Goals  OT Goals(current goals can now be  found in the care plan section)  Progress towards OT goals: Progressing toward goals  Acute Rehab OT Goals Patient Stated Goal: pt states My arm feels so much better its my pelvis now OT Goal Formulation: With patient Time For Goal Achievement: 09/07/21 Potential to Achieve Goals: Good ADL Goals Pt Will Perform Grooming: with supervision;sitting Pt Will Transfer to Toilet: with +2 assist;with mod assist;stand pivot transfer;bedside commode Additional ADL Goal #1: pt will complete bed mobility total +2 min (A) as precursor to adls.  Plan Discharge plan remains appropriate    Co-evaluation                 AM-PAC OT "6 Clicks" Daily Activity     Outcome Measure   Help from another person eating meals?: A Little Help from another person taking care of personal grooming?: A Little Help from another person toileting, which includes using toliet, bedpan, or urinal?: A Lot Help from another person bathing (including washing, rinsing, drying)?: A Lot Help from another person to put on and taking off regular upper body clothing?: A Lot Help from another person to put on and taking off regular lower body clothing?: A Lot 6 Click Score: 14    End of Session    OT Visit Diagnosis: Unsteadiness on feet (R26.81);Muscle weakness (generalized) (M62.81);Pain Pain - Right/Left: Left Pain - part of body: Hip   Activity Tolerance Patient tolerated treatment well   Patient Left in bed;with call bell/phone within reach;with bed alarm set   Nurse Communication Mobility status;Precautions;Weight bearing status;Need for lift equipment        Time: 1343 (1343)-1405 OT Time Calculation (min): 22 min  Charges: OT General Charges $OT Visit: 1 Visit OT Treatments $Therapeutic Exercise: 8-22 mins   Brynn, OTR/L  Acute Rehabilitation Services Office: [phone] .   Mateo Flow 08/26/2021, 3:39 PM

## 2021-08-26 NOTE — Anesthesia Postprocedure Evaluation (Signed)
Anesthesia Post Note  Patient: Mathew Oliver  Procedure(s) Performed: OPEN REDUCTION INTERNAL FIXATION (ORIF) DISTAL HUMERUS FRACTURE (Left)     Patient location during evaluation: PACU Anesthesia Type: General Level of consciousness: awake and alert Pain management: pain level controlled Vital Signs Assessment: post-procedure vital signs reviewed and stable Respiratory status: spontaneous breathing, nonlabored ventilation, respiratory function stable and patient connected to nasal cannula oxygen Cardiovascular status: blood pressure returned to baseline and stable Postop Assessment: no apparent nausea or vomiting Anesthetic complications: no   No notable events documented.  Last Vitals:  Vitals:   08/26/21 0411 08/26/21 0741  BP: (!) 146/93   Pulse: 85   Resp: 15   Temp: 36.6 C 36.7 C  SpO2: 97%     Last Pain:  Vitals:   08/26/21 0440  TempSrc:   PainSc: 0-No pain                 Anisia Leija

## 2021-08-26 NOTE — Progress Notes (Addendum)
PROGRESS NOTE    Mathew Oliver  [id number] DOB: 1960/09/12 DOA: 08/23/2021 PCP: Merryl Hacker, No    Brief Narrative:   Mathew Oliver is a 61 y.o. male with past medical history significant for essential hypertension, hyperlipidemia, coronary disease, chronic systolic congestive heart failure  obesity presented to the hospital with left elbow pain following a fall after losing his balance.  He hit his left elbow and hip on fall.  Patient was brought into the hospital on a splint.  In the ED, patient was seen as a level 2 trauma.  Vitals with mild tachypnea with pulse ox of 97% 2 L nasal cannula.  WBC was mildly elevated at 12.6 with hemoglobin of 14.7.   Lactic acid 2.0.  COVID-19 PCR negative.  Influenza A/B PCR negative.  Urinalysis was negative for infection. Pelvis x-ray with left pubic bone, superior pubic ramus and inferior pubic ramus fractures.  Left humerus x-ray with comminuted, displaced distal left humeral fracture.  CT left upper extremity with comminuted intra-articular fracture/dislocation at the elbow with severely displaced distal humerus fracture, transverse supracondylar component with splitting of the epicondyles, partial reticulation of the radius with a capitulum, nondisplaced radial head fracture, large joint effusion and probable hematoma, extensive soft tissue swelling and soft tissue gas suggesting open fracture.  CT head/C-spine with no acute intracranial abnormality, no acute/traumatic cervical spine pathology.  CT chest abdomen/pelvis with pelvic fractures including left superior/inferior pubic rami and left sacrum, otherwise no evidence of significant trauma or acute findings in the chest/abdomen/pelvis.  Orthopedics was consulted and patient was admitted hospital for further evaluation and treatment.   Assessment & Plan:  Principal Problem:   Open fracture of left distal humerus Active Problems:   Accidental fall from ladder   Multiple closed stable fractures of pubic  ramus (HCC)   Sacral fracture (HCC)   Lactic acidosis   Leukocytosis   Heart failure with reduced ejection fraction (HCC)   Essential hypertension   CAD S/P percutaneous coronary angioplasty   Obesity, Class III, BMI 40-49.9 (morbid obesity) ([place])   Vitamin D deficiency   Left open comminuted distal humerus fracture Status post fall from a ladder.  Underwent open reduction internal fixation of the left distal femur fracture, primary repair of partial triceps tendon tear, ulnar osteotomy and irrigation and debridement of the left open distal humerus fracture on 08/25/2021 by orthopedics.  Orthopedics recommended nonweight bearing.  Continue supportive care and analgesia.  Sacrum, pubic rami fracture Pelvis x-ray with left pubic bone fracture, superior pubic rami fracture, inferior pubic ramus fracture orthopedics has seen the patient and recommended weightbearing as tolerated.  PT OT recommends CIR at this time  Acute hypoxic respiratory failure Currently on 2 L of oxygen by nasal cannula.  Chest x-ray with cardiomegaly and mild vascular congestion.  History of  chronic congestive heart failure.  2D echocardiogram with systolic dysfunction.  Patient received Lasix on 6/11.  We will continue to monitor.  Will wean oxygen as able.  We will give additional 1 dose of IV 40 mg of Lasix today.  Leukocytosis/Lactic acidosis: Resolved Thought to be likely reactive.  Received IV fluids  Chronic systolic congestive heart failure,  Had received IV fluid hydration initially and required oxygen supplementation up to 5 L by nasal cannula.  D echocardiogram done on 08/24/2021 showed LV ejection fraction of 40 to 45% with grade 2 diastolic dysfunction.  No significant changes prior to compare.  Currently on Coreg and lisinopril.  We will get daily  intake and output charting and with weights.  We will discontinue IV fluids.  Patient is positive balance for 1991 mL at this time.  We will give 1 dose of IV Lasix  today.  Essential hypertension Continue Coreg lisinopril.  CAD s/p PCI Patient with previous history of CAD status post stent 2009.  Continue aspirin statins and beta-blockers.  Follow-up with cardiology as outpatient.  No active issues at this time  Hyperlipidemia: Continue Lipitor.  Morbid obesity Body mass index is 44.01 kg/m.  Would benefit from weight loss as outpatient.  Vitamin D deficiency.  We will provide the 50,000 unit vitamin D.  Continue weekly.   DVT prophylaxis: Place and maintain sequential compression device Start: 08/26/21 0824 SCDs Start: 08/25/21 1552  Lovenox    Code Status: Full Code  Family Communication:  Spoke with the patient at bedside   Disposition Plan:  Level of care: Telemetry Medical  Status is: Inpatient  Remains inpatient appropriate because: Plan for CIR placement  Consultants:  Orthopedics, Dr. Doreatha Martin  Procedures:  Open reduction internal fixation of the left distal femur fracture, primary repair of partial triceps tendon tear, ulnar osteotomy and irrigation and debridement of the left open distal humerus fracture on 08/25/2021 by orthopedics.   Antimicrobials:  Cefazolin for surgical prophylaxis  Subjective: Today, patient was seen and examined at bedside.  Denies overt pain.  No fever chills nausea vomiting   Objective: Vitals:   08/25/21 2011 08/26/21 0411 08/26/21 0500 08/26/21 0741  BP: 139/87 (!) 146/93    Pulse: 90 85    Resp: 20 15    Temp: 97.8 F (36.6 C) 97.8 F (36.6 C)  98 F (36.7 C)  TempSrc: Oral Oral    SpO2: 96% 97%    Weight:   (!) 147.2 kg   Height:        Intake/Output Summary (Last 24 hours) at 08/26/2021 1136 Last data filed at 08/25/2021 2330 Gross per 24 hour  Intake 1743 ml  Output 1100 ml  Net 643 ml   Filed Weights   08/23/21 0100 08/24/21 0845 08/26/21 0500  Weight: 136.1 kg (!) 139.7 kg (!) 147.2 kg   Body mass index is 44.01 kg/m.   Examination: General: Morbidly obese built,  not in obvious distress HENT:   No scleral pallor or icterus noted. Oral mucosa is moist.  On Nasal cannula oxygen Chest:   Diminished breath sounds bilaterally. No crackles or wheezes.  CVS: S1 &S2 heard. No murmur.  Regular rate and rhythm. Abdomen: Soft, nontender, nondistended.  Bowel sounds are heard.   Extremities: No cyanosis, clubbing or edema.  Peripheral pulses are palpable.  Left upper extremity in a splint.Distal capillary refill present. Psych: Alert, awake and oriented, normal mood CNS:  No cranial nerve deficits.  Moves extremities.  Left upper extremity in a splint.   Skin: Warm and dry.  No rashes noted.   Data Reviewed: I have personally reviewed following labs and imaging studies  CBC: Recent Labs  Lab 08/23/21 0114 08/23/21 0122 08/24/21 0239 08/24/21 2209 08/25/21 0058 08/26/21 0126  WBC 12.6*  --  12.5* 12.5* 10.9* 13.5*  HGB 14.7 14.3 13.2 12.7* 12.2* 12.9*  HCT 43.3 42.0 39.7 36.6* 36.7* 38.5*  MCV 94.5  --  94.3 94.1 94.3 93.9  PLT 193  --  170 169 164 [id number]   Basic Metabolic Panel: Recent Labs  Lab 08/23/21 0114 08/23/21 0122 08/24/21 0239 08/24/21 2209 08/25/21 0058 08/26/21 0126  NA 139 138 136 136  137 136  K 4.1 4.0 3.9 3.9 3.7 4.2  CL 107 107 104 100 102 102  CO2 22  --  26 28 28 27   GLUCOSE 194* 195* 183* 164* 177* 186*  BUN 21 25* 15 20 20 14   CREATININE 0.81 0.70 0.79 1.01 0.96 0.64  CALCIUM 8.7*  --  8.2* 8.3* 8.4* 8.3*  MG  --   --   --   --  2.2 2.2   GFR: Estimated Creatinine Clearance: 144.6 mL/min (by C-G formula based on SCr of 0.64 mg/dL). Liver Function Tests: Recent Labs  Lab 08/23/21 0114 08/24/21 2209  AST 40 18  ALT 33 20  ALKPHOS 49 44  BILITOT 0.6 0.9  PROT 5.8* 5.5*  ALBUMIN 3.3* 2.8*   No results for input(s): "LIPASE", "AMYLASE" in the last 168 hours. No results for input(s): "AMMONIA" in the last 168 hours. Coagulation Profile: Recent Labs  Lab 08/23/21 0114  INR 1.0   Cardiac Enzymes: Recent Labs   Lab 08/23/21 1714  CKTOTAL 252   BNP (last 3 results) No results for input(s): "PROBNP" in the last 8760 hours. HbA1C: Recent Labs    08/23/21 1853  HGBA1C 7.5*   CBG: No results for input(s): "GLUCAP" in the last 168 hours. Lipid Profile: No results for input(s): "CHOL", "HDL", "LDLCALC", "TRIG", "CHOLHDL", "LDLDIRECT" in the last 72 hours. Thyroid Function Tests: No results for input(s): "TSH", "T4TOTAL", "FREET4", "T3FREE", "THYROIDAB" in the last 72 hours. Anemia Panel: No results for input(s): "VITAMINB12", "FOLATE", "FERRITIN", "TIBC", "IRON", "RETICCTPCT" in the last 72 hours. Sepsis Labs: Recent Labs  Lab 08/23/21 0114 08/23/21 1714 08/24/21 2209 08/25/21 0058  PROCALCITON  --   --  <0.10  --   LATICACIDVEN 2.0* 1.6 1.2 0.9    Recent Results (from the past 240 hour(s))  Resp Panel by RT-PCR (Flu A&B, Covid) Anterior Nasal Swab     Status: None   Collection Time: 08/23/21  1:30 AM   Specimen: Anterior Nasal Swab  Result Value Ref Range Status   SARS Coronavirus 2 by RT PCR NEGATIVE NEGATIVE Final    Comment: (NOTE) SARS-CoV-2 target nucleic acids are NOT DETECTED.  The SARS-CoV-2 RNA is generally detectable in upper respiratory specimens during the acute phase of infection. The lowest concentration of SARS-CoV-2 viral copies this assay can detect is 138 copies/mL. A negative result does not preclude SARS-Cov-2 infection and should not be used as the sole basis for treatment or other patient management decisions. A negative result may occur with  improper specimen collection/handling, submission of specimen other than nasopharyngeal swab, presence of viral mutation(s) within the areas targeted by this assay, and inadequate number of viral copies(<138 copies/mL). A negative result must be combined with clinical observations, patient history, and epidemiological information. The expected result is Negative.  Fact Sheet for Patients:   EntrepreneurPulse.com.au  Fact Sheet for Healthcare Providers:  IncredibleEmployment.be  This test is no t yet approved or cleared by the [place] FDA and  has been authorized for detection and/or diagnosis of SARS-CoV-2 by FDA under an Emergency Use Authorization (EUA). This EUA will remain  in effect (meaning this test can be used) for the duration of the COVID-19 declaration under Section 564(b)(1) of the Act, 21 U.S.C.section 360bbb-3(b)(1), unless the authorization is terminated  or revoked sooner.       Influenza A by PCR NEGATIVE NEGATIVE Final   Influenza B by PCR NEGATIVE NEGATIVE Final    Comment: (NOTE) The Xpert Xpress SARS-CoV-2/FLU/RSV plus  assay is intended as an aid in the diagnosis of influenza from Nasopharyngeal swab specimens and should not be used as a sole basis for treatment. Nasal washings and aspirates are unacceptable for Xpert Xpress SARS-CoV-2/FLU/RSV testing.  Fact Sheet for Patients: EntrepreneurPulse.com.au  Fact Sheet for Healthcare Providers: IncredibleEmployment.be  This test is not yet approved or cleared by the [place] FDA and has been authorized for detection and/or diagnosis of SARS-CoV-2 by FDA under an Emergency Use Authorization (EUA). This EUA will remain in effect (meaning this test can be used) for the duration of the COVID-19 declaration under Section 564(b)(1) of the Act, 21 U.S.C. section 360bbb-3(b)(1), unless the authorization is terminated or revoked.  Performed at [facility], [place] [address] [place], [place] [address]   Surgical pcr screen     Status: Abnormal   Collection Time: 08/24/21 10:54 PM   Specimen: Nasal Mucosa; Nasal Swab  Result Value Ref Range Status   MRSA, PCR NEGATIVE NEGATIVE Final   Staphylococcus aureus POSITIVE (A) NEGATIVE Final    Comment: (NOTE) The Xpert SA Assay (FDA approved for NASAL specimens in  patients 74 years of age and older), is one component of a comprehensive surveillance program. It is not intended to diagnose infection nor to guide or monitor treatment. Performed at [facility], [place] [address] [place], [place] [address]      Radiology Studies: DG Elbow 2 Views Left  Result Date: 08/25/2021 CLINICAL DATA:  Fracture distal left humerus EXAM: LEFT ELBOW - 2 VIEW COMPARISON:  08/23/2021 FINDINGS: There is interval reduction and internal fixation of comminuted fractures of distal humerus. There is marked improvement in alignment of fracture fragments. There is surgical screw in the proximal ulna. IMPRESSION: Reduction and internal fixation of comminuted displaced fracture of distal left humerus. Electronically Signed   By: Elmer Picker M.D.   On: 08/25/2021 16:35   DG Humerus Left  Result Date: 08/25/2021 CLINICAL DATA:  Distal humeral ORIF EXAM: LEFT HUMERUS - 2+ VIEW COMPARISON:  None Available. FINDINGS: Multiple intraoperative fluoroscopic spot images are provided. Comminuted distal humeral fracture transfixed with a dorsal and medial sideplate with multiple interlocking screws. FLUOROSCOPY TIME:  Radiation Exposure Index: 2.83 mGy IMPRESSION: 1. Interval ORIF of a left distal humeral fracture. Electronically Signed   By: Kathreen Devoid M.D.   On: 08/25/2021 14:53   DG C-Arm 1-60 Min-No Report  Result Date: 08/25/2021 Fluoroscopy was utilized by the requesting physician.  No radiographic interpretation.   DG C-Arm 1-60 Min-No Report  Result Date: 08/25/2021 Fluoroscopy was utilized by the requesting physician.  No radiographic interpretation.   ECHOCARDIOGRAM COMPLETE  Result Date: 08/24/2021    ECHOCARDIOGRAM REPORT   Patient Name:   KYLYN WANTZ Date of Exam: 08/24/2021 Medical Rec #:  [id number]        Height:       72.0 in Accession #:    [id number]       Weight:       308.0 lb Date of Birth:  December 03, 1960        BSA:          2.560 m Patient Age:    56  years         BP:           116/76 mmHg Patient Gender: M                HR:           75  bpm. Exam Location:  Inpatient Procedure: 2D Echo, Cardiac Doppler, Color Doppler and Intracardiac            Opacification Agent Indications:    Abnormal EKG  History:        Patient has prior history of Echocardiogram examinations.                 Abnormal ECG. [place] off a ladder and broke his elbow.  Sonographer:    Merrie Roof RDCS Referring Phys: [id number] [place]  1. Left ventricular ejection fraction, by estimation, is 40 to 45%. The left ventricle has mildly decreased function. The left ventricle demonstrates regional wall motion abnormalities (see scoring diagram/findings for description). The left ventricular  internal cavity size was mildly dilated. There is mild concentric left ventricular hypertrophy. Left ventricular diastolic parameters are consistent with Grade II diastolic dysfunction (pseudonormalization). There is hypokinesis of the left ventricular,  basal-mid inferoseptal wall and inferior wall.  2. Right ventricular systolic function is normal. The right ventricular size is mildly enlarged. Tricuspid regurgitation signal is inadequate for assessing PA pressure.  3. Left atrial size was moderately dilated.  4. Right atrial size was mildly dilated.  5. The mitral valve was not well visualized. Trivial mitral valve regurgitation. No evidence of mitral stenosis.  6. The aortic valve was not well visualized. Aortic valve regurgitation is not visualized. No aortic stenosis is present. Comparison(s): Prior images unable to be directly viewed, comparison made by report only. No significant change from prior study. [facility] echo 08/21/19: EF 40-45%, inferolateral hypokinesis. Conclusion(s)/Recommendation(s): Technically challenging images, even with use of echo contrast. EF and wall motion as noted, no significant valve disease by doppler. FINDINGS  Left Ventricle: Left ventricular ejection fraction,  by estimation, is 40 to 45%. The left ventricle has mildly decreased function. The left ventricle demonstrates regional wall motion abnormalities. Definity contrast agent was given IV to delineate the left ventricular endocardial borders. The left ventricular internal cavity size was mildly dilated. There is mild concentric left ventricular hypertrophy. Left ventricular diastolic parameters are consistent with Grade II diastolic dysfunction (pseudonormalization). Right Ventricle: The right ventricular size is mildly enlarged. Right vetricular wall thickness was not well visualized. Right ventricular systolic function is normal. Tricuspid regurgitation signal is inadequate for assessing PA pressure. Left Atrium: Left atrial size was moderately dilated. Right Atrium: Right atrial size was mildly dilated. Pericardium: There is no evidence of pericardial effusion. Mitral Valve: The mitral valve was not well visualized. Trivial mitral valve regurgitation. No evidence of mitral valve stenosis. Tricuspid Valve: The tricuspid valve is not well visualized. Tricuspid valve regurgitation is trivial. No evidence of tricuspid stenosis. Aortic Valve: The aortic valve was not well visualized. Aortic valve regurgitation is not visualized. No aortic stenosis is present. Aortic valve mean gradient measures 4.0 mmHg. Aortic valve peak gradient measures 6.5 mmHg. Aortic valve area, by VTI measures 2.24 cm. Pulmonic Valve: The pulmonic valve was not well visualized. Pulmonic valve regurgitation is not visualized. Aorta: The aortic root was not well visualized and the ascending aorta was not well visualized. IAS/Shunts: The interatrial septum was not well visualized.  LEFT VENTRICLE PLAX 2D LVIDd:         5.25 cm   Diastology LVIDs:         3.99 cm   LV e' medial:    4.95 cm/s LV PW:         1.40 cm   LV E/e' medial:  13.6 LV IVS:  1.30 cm   LV e' lateral:   12.60 cm/s LVOT diam:     2.10 cm   LV E/e' lateral: 5.3 LV SV:          50 LV SV Index:   19 LVOT Area:     3.46 cm  RIGHT VENTRICLE RV Basal diam:  4.20 cm RV Mid diam:    5.00 cm RV S prime:     12.10 cm/s TAPSE (M-mode): 2.6 cm LEFT ATRIUM              Index        RIGHT ATRIUM           Index LA diam:        6.20 cm  2.42 cm/m   RA Area:     23.10 cm LA Vol (A2C):   120.0 ml 46.88 ml/m  RA Volume:   63.40 ml  24.77 ml/m LA Vol (A4C):   115.0 ml 44.93 ml/m LA Biplane Vol: 125.0 ml 48.83 ml/m  AORTIC VALVE AV Area (Vmax):    2.15 cm AV Area (Vmean):   2.02 cm AV Area (VTI):     2.24 cm AV Vmax:           127.00 cm/s AV Vmean:          88.400 cm/s AV VTI:            0.221 m AV Peak Grad:      6.5 mmHg AV Mean Grad:      4.0 mmHg LVOT Vmax:         79.00 cm/s LVOT Vmean:        51.600 cm/s LVOT VTI:          0.143 m LVOT/AV VTI ratio: 0.65  AORTA Ao Root diam: 3.90 cm Ao Asc diam:  3.70 cm MITRAL VALVE MV Area (PHT): 4.06 cm    SHUNTS MV Decel Time: 187 msec    Systemic VTI:  0.14 m MV E velocity: 67.40 cm/s  Systemic Diam: 2.10 cm MV A velocity: 81.30 cm/s MV E/A ratio:  0.83 Buford Dresser MD Electronically signed by Buford Dresser MD Signature Date/Time: 08/24/2021/3:38:56 PM    Final       Scheduled Meds:  aspirin EC  325 mg Oral QHS   atorvastatin  40 mg Oral QPM   Chlorhexidine Gluconate Cloth  6 each Topical Daily   docusate sodium  100 mg Oral BID   enoxaparin (LOVENOX) injection  70 mg Subcutaneous Q24H   furosemide  40 mg Intravenous Once   mupirocin ointment  1 application  Nasal BID   sodium chloride flush  3 mL Intravenous Q12H   traMADol  50 mg Oral Q6H   Vitamin D (Ergocalciferol)  50,000 Units Oral Q7 days   Continuous Infusions:   ceFAZolin (ANCEF) IV 2 g (08/26/21 0357)   methocarbamol (ROBAXIN) IV       LOS: 3 days   Flora Lipps, MD [facility] Available via Epic secure chat 7am-7pm After these hours, please refer to coverage provider listed on [email or web address] 08/26/2021, 11:36 AM .

## 2021-08-26 NOTE — Progress Notes (Signed)
Inpatient Rehabilitation Admissions Coordinator   I returned call from [facility] for workers compensation, Quentin Angst at [phone]. Once I have both postoperative PT and OT treatment notes from today, I will fax to her and Alcario Drought with [facility] [employer] services to begin Auth for a possible [facility] admit.  Ottie Glazier, RN, MSN Rehab Admissions Coordinator [phone] 08/26/2021 2:24 PM

## 2021-08-26 NOTE — Progress Notes (Signed)
Physical Therapy Treatment Patient Details Name: Mathew Oliver MRN: [id number] DOB: 1960-11-24 Today's Date: 08/26/2021   History of Present Illness 61 yo male presenting 6/10 after sustaining a fall off of a ladder with pelvic fractures including left superior, inferior pubic rami, and left sacrum. Pt also with comminuted L distal humerus fx.  ORIF left UE 6/12. PMH includes: tobacco use, MI.    PT Comments    Pt admitted with above diagnosis. Pt was able to assist with scoot transfer to drop arm recliner with mod to min assist. Pt able to weight shift and scoot most of way with min assist and needed mod assist to plant self on cushion.  Pt however needed a lot of assist to come to eOB.  Neeeds continued PT.  Pt currently with functional limitations due to balance and endurance deficits. Pt will benefit from skilled PT to increase their independence and safety with mobility to allow discharge to the venue listed below.      Recommendations for follow up therapy are one component of a multi-disciplinary discharge planning process, led by the attending physician.  Recommendations may be updated based on patient status, additional functional criteria and insurance authorization.  Follow Up Recommendations  Acute inpatient rehab (3hours/day)     Assistance Recommended at Discharge Frequent or constant Supervision/Assistance  Patient can return home with the following Two people to help with walking and/or transfers;Two people to help with bathing/dressing/bathroom;Assistance with cooking/housework;Assistance with feeding;Direct supervision/assist for medications management;Direct supervision/assist for financial management;Assist for transportation;Help with stairs or ramp for entrance   Equipment Recommendations  Wheelchair (measurements PT);Wheelchair cushion (measurements PT)    Recommendations for Other Services Rehab consult     Precautions / Restrictions Precautions Precautions:  Fall Required Braces or Orthoses: Sling (L UE) Restrictions Weight Bearing Restrictions: Yes LUE Weight Bearing: Non weight bearing     Mobility  Bed Mobility Overal bed mobility: Needs Assistance Bed Mobility: Supine to Sit, Sit to Supine     Supine to sit: Max assist     General bed mobility comments: pt attempting with max encouragement to move LE, unable to effectively manage trunk despite use of core and RUE. max A to complete transfer and pt needing RUE support on bed rail to steady in sitting    Transfers Overall transfer level: Needs assistance   Transfers: Bed to chair/wheelchair/BSC            Lateral/Scoot Transfers: Mod assist, From elevated surface General transfer comment: bed elevated and arm of recliner dropped with pt scooting to his right. Pt was able to scoot most of way with min assist and cues.  Did assist last bit with mod assist with pt using armrest on recliner to pull on to get pt on the cushion well.  Maximove in place for nurses to get pt back to bed.    Ambulation/Gait               General Gait Details: pt unable   Stairs             Wheelchair Mobility    Modified Rankin (Stroke Patients Only)       Balance Overall balance assessment: Needs assistance Sitting-balance support: Single extremity supported, Feet supported Sitting balance-Leahy Scale: Fair Sitting balance - Comments: strong R lean to offweight L pelvis Postural control: Right lateral lean  Cognition Arousal/Alertness: Awake/alert Behavior During Therapy: WFL for tasks assessed/performed Overall Cognitive Status: Within Functional Limits for tasks assessed                                          Exercises General Exercises - Lower Extremity Ankle Circles/Pumps: AROM, Both, 5 reps, Supine Long Arc Quad: AROM, Both, 5 reps, Seated Heel Slides: AROM, AAROM, Both, 10 reps, Supine     General Comments General comments (skin integrity, edema, etc.): VSS      Pertinent Vitals/Pain Pain Assessment Pain Assessment: Faces Faces Pain Scale: Hurts even more Breathing: normal Negative Vocalization: occasional moan/groan, low speech, negative/disapproving quality Facial Expression: smiling or inexpressive Body Language: relaxed Consolability: no need to console PAINAD Score: 1 Pain Location: elbow, hip Pain Descriptors / Indicators: Stabbing Pain Intervention(s): Limited activity within patient's tolerance, Monitored during session, Repositioned, Premedicated before session    Home Living                          Prior Function            PT Goals (current goals can now be found in the care plan section) Acute Rehab PT Goals Patient Stated Goal: reduce pain Progress towards PT goals: Progressing toward goals    Frequency    Min 4X/week      PT Plan Current plan remains appropriate    Co-evaluation              AM-PAC PT "6 Clicks" Mobility   Outcome Measure  Help needed turning from your back to your side while in a flat bed without using bedrails?: Total Help needed moving from lying on your back to sitting on the side of a flat bed without using bedrails?: Total Help needed moving to and from a bed to a chair (including a wheelchair)?: A Lot Help needed standing up from a chair using your arms (e.g., wheelchair or bedside chair)?: Total Help needed to walk in hospital room?: Total Help needed climbing 3-5 steps with a railing? : Total 6 Click Score: 7    End of Session Equipment Utilized During Treatment: Gait belt Activity Tolerance: Patient limited by fatigue;Patient limited by pain Patient left: with call bell/phone within reach;in chair;with chair alarm set Nurse Communication: Mobility status;Need for lift equipment (maximove) PT Visit Diagnosis: Unsteadiness on feet (R26.81);Other abnormalities of gait and mobility  (R26.89);Muscle weakness (generalized) (M62.81);Pain Pain - Right/Left: Right Pain - part of body: Arm (L leg/pelvis)     Time: [id number] PT Time Calculation (min) (ACUTE ONLY): 21 min  Charges:  $Therapeutic Activity: 8-22 mins                     [facility] M,PT Acute Rehab Services [phone]    Alvira Philips 08/26/2021, 11:17 AM

## 2021-08-27 DIAGNOSIS — E559 Vitamin D deficiency, unspecified: Secondary | ICD-10-CM | POA: Diagnosis not present

## 2021-08-27 DIAGNOSIS — I2510 Atherosclerotic heart disease of native coronary artery without angina pectoris: Secondary | ICD-10-CM | POA: Diagnosis not present

## 2021-08-27 DIAGNOSIS — S42402B Unspecified fracture of lower end of left humerus, initial encounter for open fracture: Secondary | ICD-10-CM | POA: Diagnosis not present

## 2021-08-27 DIAGNOSIS — W11XXXA Fall on and from ladder, initial encounter: Secondary | ICD-10-CM | POA: Diagnosis not present

## 2021-08-27 LAB — CBC
HCT: 34.4 % — ABNORMAL LOW (ref 39.0–52.0)
Hemoglobin: 11.8 g/dL — ABNORMAL LOW (ref 13.0–17.0)
MCH: 32.1 pg (ref 26.0–34.0)
MCHC: 34.3 g/dL (ref 30.0–36.0)
MCV: 93.5 fL (ref 80.0–100.0)
Platelets: 214 10*3/uL (ref 150–400)
RBC: 3.68 MIL/uL — ABNORMAL LOW (ref 4.22–5.81)
RDW: 12.2 % (ref 11.5–15.5)
WBC: 10.1 10*3/uL (ref 4.0–10.5)
nRBC: 0 % (ref 0.0–0.2)

## 2021-08-27 LAB — BASIC METABOLIC PANEL
Anion gap: 5 (ref 5–15)
BUN: 17 mg/dL (ref 8–23)
CO2: 32 mmol/L (ref 22–32)
Calcium: 8.3 mg/dL — ABNORMAL LOW (ref 8.9–10.3)
Chloride: 99 mmol/L (ref 98–111)
Creatinine, Ser: 0.68 mg/dL (ref 0.61–1.24)
GFR, Estimated: >60 mL/min (ref >60)
Glucose, Bld: 153 mg/dL — ABNORMAL HIGH (ref 70–99)
Potassium: 3.9 mmol/L (ref 3.5–5.1)
Sodium: 136 mmol/L (ref 135–145)

## 2021-08-27 MED ORDER — POLYETHYLENE GLYCOL 3350 17 G PO PACK
17.0000 g | PACK | Freq: Every day | ORAL | Status: DC
  Administered 2021-08-27 – 2021-08-31 (×5): 17 g via ORAL
  Filled 2021-08-27 (×5): qty 1

## 2021-08-27 NOTE — PMR Pre-admission (Signed)
PMR Admission Coordinator Pre-Admission Assessment  Patient: Mathew Oliver is an 61 y.o., male MRN: [id number] DOB: [birth year] Height: 6' (182.9 cm) Weight: (!) 147.2 kg  Insurance Information HMO:     PPO:      PCP:      IPA:      80/20:      OTHER:  PRIMARY: Workers Compensation/[employer]    Policy#: claim # [id number]      Subscriber: pt  CM Name: approved by Odette Horns at "[employer] "   Phone#:[phone] ext [phone]    Fax#: Melissa onsite field case manager getting clinical updates onsite and providing to [employer]  Pre-Cert#: claim # [id number] approved for 14 days from admit 6/18  .Follow up contact at [employer] is Donia Pounds phone [phone] ext [phone]    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 Benefits:  Phone #: Jettie Pagan phone [phone] and [phone]   Field Case manager on site visiting patient daily is Wallene Huh [phone] ( I told her she would not be allowed at weekly team conference)  Adjuster is Danae Chen at [phone] MAIN CONTACT is Donia Pounds phone [phone] ext [phone] at "[employer]"  Financial Counselor:       Phone#:   The "Data Collection Information Summary" for patients in Inpatient Rehabilitation Facilities with attached "Privacy Act [place] Records" was provided and verbally reviewed with: N/A  Emergency Contact Information Contact Information     Name Relation Home Work Mobile   [name] Spouse [phone]  [phone]      Current Medical History  Patient Admitting Diagnosis: polytrauma  History of Present Illness: 61 year old male with history of HTN, HLD, CAD, heart failure with EF 40 to 45 % who presented on 08/23/21 after a fall from ladder about 20 feet high at work. Fell on left side. No LOC.   Orthopedics consulted for evaluation of pelvic fractures and left distal humerus fracture. Imaging notable for left pubic bone fracture, superior pubic rami fracture, inferior pubic ramus fracture.  Recommendations for WBAT bilateral lower extremities for his pelvic fractures. Imaging for left comminuted distal humerus fracture with displacement/dislocation and nondisplaced radial head fracture. CT also concerning for possible open fracture given extensive soft tissue swelling and soft tissue gas. NWB LUE. Cefazolin for possible open fracture. Dr Doreatha Martin with ORIF on 6/12 humerus Fracture.Continue pain management.   Received IV Lasix twice due to initial acute hypoxic respiratory failure. 2D echo with systolic dysfunction. Currently on Coreg, spironolactone and lisinopril .   Continue Miralax and Colace for constipation. Continue ASA , statins and beta blockers for history of CAD with S/P PCI.   Patient's medical record from [facility] has been reviewed by the rehabilitation admission coordinator and physician.  Past Medical History  Past Medical History:  Diagnosis Date   MI, acute, non ST segment elevation ([employer])    Has the patient had major surgery during 100 days prior to admission? Yes  Family History   family history is not on file.  Current Medications  Current Facility-Administered Medications:    acetaminophen (TYLENOL) tablet 650 mg, 650 mg, Oral, Q6H PRN, 650 mg at 08/24/21 2156 **OR** acetaminophen (TYLENOL) suppository 650 mg, 650 mg, Rectal, Q6H PRN, McClung, Sarah A, PA-C   albuterol (PROVENTIL) (2.5 MG/3ML) 0.083% nebulizer solution 2.5 mg, 2.5 mg, Nebulization, Q2H PRN, Tamala Julian, Rondell A, MD   aspirin EC tablet 325 mg, 325 mg, Oral, QHS, Corinne Ports, PA-C, 325 mg at 08/28/21 2032   atorvastatin (LIPITOR)  tablet 40 mg, 40 mg, Oral, QPM, Corinne Ports, PA-C, 40 mg at 08/29/21 1619   carvedilol (COREG) tablet 25 mg, 25 mg, Oral, BID WC, Pokhrel, Laxman, MD, 25 mg at 08/29/21 1619   Chlorhexidine Gluconate Cloth 2 % PADS 6 each, 6 each, Topical, Daily, Corinne Ports, PA-C, 6 each at 08/29/21 1053   cholecalciferol (VITAMIN D3) tablet 1,000 Units, 1,000  Units, Oral, Daily, Corinne Ports, PA-C, 1,000 Units at 08/29/21 1051   docusate sodium (COLACE) capsule 100 mg, 100 mg, Oral, BID, McClung, Sarah A, PA-C, 100 mg at 08/29/21 1051   enoxaparin (LOVENOX) injection 70 mg, 70 mg, Subcutaneous, Q24H, Corinne Ports, PA-C, 70 mg at 08/29/21 1051   HYDROmorphone (DILAUDID) injection 0.5-1 mg, 0.5-1 mg, Intravenous, Q4H PRN, Corinne Ports, PA-C, 1 mg at 08/29/21 1317   lisinopril (ZESTRIL) tablet 40 mg, 40 mg, Oral, QPM, Pokhrel, Laxman, MD, 40 mg at 08/29/21 1619   methocarbamol (ROBAXIN) tablet 500 mg, 500 mg, Oral, Q6H PRN, 500 mg at 08/29/21 0147 **OR** methocarbamol (ROBAXIN) 500 mg in dextrose 5 % 50 mL IVPB, 500 mg, Intravenous, Q6H PRN, McClung, Sarah A, PA-C   metoCLOPramide (REGLAN) tablet 5-10 mg, 5-10 mg, Oral, Q8H PRN **OR** metoCLOPramide (REGLAN) injection 5-10 mg, 5-10 mg, Intravenous, Q8H PRN, McClung, Sarah A, PA-C   mupirocin ointment (BACTROBAN) 2 % 1 application , 1 application , Nasal, BID, Corinne Ports, PA-C, 1 application  at 57/84/69 1051   ondansetron (ZOFRAN) tablet 4 mg, 4 mg, Oral, Q6H PRN **OR** ondansetron (ZOFRAN) injection 4 mg, 4 mg, Intravenous, Q6H PRN, McClung, Sarah A, PA-C   oxyCODONE-acetaminophen (PERCOCET/ROXICET) 5-325 MG per tablet 1-2 tablet, 1-2 tablet, Oral, Q6H PRN, Corinne Ports, PA-C, 2 tablet at 08/29/21 1619   polyethylene glycol (MIRALAX / GLYCOLAX) packet 17 g, 17 g, Oral, Daily, Pokhrel, Laxman, MD, 17 g at 08/29/21 1051   sodium chloride flush (NS) 0.9 % injection 3 mL, 3 mL, Intravenous, Q12H, McClung, Sarah A, PA-C, 3 mL at 08/29/21 1052   spironolactone (ALDACTONE) tablet 25 mg, 25 mg, Oral, QPM, Pokhrel, Laxman, MD, 25 mg at 08/29/21 1619   traMADol (ULTRAM) tablet 50 mg, 50 mg, Oral, Q6H, McClung, Sarah A, PA-C, 50 mg at 08/29/21 1619   Vitamin D (Ergocalciferol) (DRISDOL) capsule 50,000 Units, 50,000 Units, Oral, Q7 days, Pokhrel, Laxman, MD, 50,000 Units at 08/26/21 1259  Patients  Current Diet:  Diet Order             Diet Heart Room service appropriate? Yes; Fluid consistency: Thin  Diet effective now                  Precautions / Restrictions Precautions Precautions: Fall Restrictions Weight Bearing Restrictions: Yes LUE Weight Bearing: Non weight bearing   Has the patient had 2 or more falls or a fall with injury in the past year? Yes  Prior Activity Level Community (5-7x/wk): Independent, driving, and working  Prior Functional Level Self Care: Did the patient need help bathing, dressing, using the toilet or eating? Independent  Indoor Mobility: Did the patient need assistance with walking from room to room (with or without device)? Independent  Stairs: Did the patient need assistance with internal or external stairs (with or without device)? Independent  Functional Cognition: Did the patient need help planning regular tasks such as shopping or remembering to take medications? Independent  Patient Information Are you of Hispanic, Latino/a,or Spanish origin?: A. No, not of Hispanic, Latino/a, or [place]  origin What is your race?: A. White Do you need or want an interpreter to communicate with a doctor or health care staff?: 0. No  Patient's Response To:  Health Literacy and Transportation Is the patient able to respond to health literacy and transportation needs?: Yes Health Literacy - How often do you need to have someone help you when you read instructions, pamphlets, or other written material from your doctor or pharmacy?: Never In the past 12 months, has lack of transportation kept you from medical appointments or from getting medications?: No In the past 12 months, has lack of transportation kept you from meetings, work, or from getting things needed for daily living?: No  Development worker, international aid / [place] Devices/Equipment: None Home Equipment: Grab bars - tub/shower  Prior Device Use: Indicate devices/aids used by the  patient prior to current illness, exacerbation or injury? None of the above  Current Functional Level Cognition  Overall Cognitive Status: Within Functional Limits for tasks assessed Orientation Level: Oriented X4    Extremity Assessment (includes Sensation/Coordination)  Upper Extremity Assessment: LUE deficits/detail LUE Deficits / Details: Completed AAROM L elbow and shoulder to pain tolerance, AROM forearm, wrist and hand, unhindered by pain. LUE Coordination: decreased gross motor  Lower Extremity Assessment: Defer to PT evaluation RLE Deficits / Details: grossly functional, pt reports general soreness with movement due to pelvic pain LLE Deficits / Details: limited by pain, limited movement against gravity at hip, functional at knee and ankle LLE: Unable to fully assess due to pain LLE Sensation: WNL LLE Coordination: WNL    ADLs  Overall ADL's : Needs assistance/impaired Eating/Feeding: Bed level, Minimal assistance Grooming: Minimal assistance, Sitting, Wash/dry hands, Wash/dry face, Oral care Upper Body Bathing: Maximal assistance Lower Body Bathing: Total assistance Upper Body Dressing : Maximal assistance, Bed level Lower Body Dressing: Total assistance General ADL Comments: pt return back to bed just prior to arrival. pt reports sitting up for over one hour and half. pt feeling hot on arrival but agreeable to starting ROM of L UE. Pt provided 4 new batteries for portal hospital fans in the room    Mobility  Overal bed mobility: Needs Assistance Bed Mobility: Supine to Sit Supine to sit: Min assist Sit to supine: Mod assist General bed mobility comments: Cues provided for sequencing and HHA utilized on R as well as side rail on R.    Transfers  Overall transfer level: Needs assistance Equipment used: Rolling walker (2 wheels) (serving as hemi-walker on right side) Transfers: Sit to/from Stand Sit to Stand: +2 physical assistance, +2 safety/equipment, Min assist Bed  to/from chair/wheelchair/BSC transfer type:: Via Lift equipment  Lateral/Scoot Transfers: Mod assist, From elevated surface Transfer via Lift Equipment: Stedy General transfer comment: Pt utilized RW with R UE to arise to standing position. Pt performed pre-gait weight shifts side to side and was able to progress to lifting R LE off floor several times although L knee was blocked.  Pt sat down at EOB after fatiguing but then again performed sit to stand for side steps at side of bed.    Ambulation / Gait / Stairs / Wheelchair Mobility  Ambulation/Gait Ambulation/Gait assistance: Mod assist, +2 physical assistance, +2 safety/equipment Gait Distance (Feet): 2 Feet Assistive device: Rolling walker (2 wheels) (RW utilized as hemi-walker on R side) Gait Pattern/deviations: Decreased stance time - right, Decreased stance time - left, Decreased weight shift to left, Antalgic General Gait Details: Pt performed side steps while standing next to bed. Therapist  assisted pt with moving RW (functioning as hemi-walker) and tech assisted pt by blocking his left knee when right LE was in swing phase.    Posture / Balance Dynamic Sitting Balance Sitting balance - Comments: pt able to maintain static sitting balance at midline with single arm support Balance Overall balance assessment: Needs assistance Sitting-balance support: Single extremity supported, Feet supported Sitting balance-Leahy Scale: Fair Sitting balance - Comments: pt able to maintain static sitting balance at midline with single arm support Postural control: Right lateral lean Standing balance support: Single extremity supported, No upper extremity supported Standing balance-Leahy Scale: Poor Standing balance comment: pt able to maintain good static standing balance with R UE on RW    Special needs/care consideration    Previous Home Environment  Living Arrangements: Spouse/significant other  Lives With: Spouse Available Help at  Discharge: Family, Available 24 hours/day Type of Home: Mobile home Home Layout: One level Home Access: Stairs to enter Entrance Stairs-Rails: None Entrance Stairs-Number of Steps: 1-2 Bathroom Shower/Tub: Chiropodist: Handicapped height Bathroom Accessibility: Yes How Accessible: Accessible via walker Home Care Services: No Additional Comments: wife can (A)  Discharge Living Setting Plans for Discharge Living Setting: Patient's home, Lives with (comment), Mobile Home (wife) Type of Home at Discharge: Mobile home Discharge Home Layout: One level Discharge Home Access: Stairs to enter Entrance Stairs-Rails: None Entrance Stairs-Number of Steps: 1-2 Discharge Bathroom Shower/Tub: Tub/shower unit Discharge Bathroom Toilet: Handicapped height Discharge Bathroom Accessibility: Yes How Accessible: Accessible via walker Does the patient have any problems obtaining your medications?: No  Social/Family/Support Systems Patient Roles: Spouse, Parent (employee) Contact Information: wife, Mathew Oliver Anticipated Caregiver: wife and family Anticipated Caregiver's Contact Information: see contacts Ability/Limitations of Caregiver: none Caregiver Availability: 24/7 Discharge Plan Discussed with Primary Caregiver: Yes Is Caregiver In Agreement with Plan?: Yes Does Caregiver/Family have Issues with Lodging/Transportation while Pt is in Rehab?: No  Goals Patient/Family Goal for Rehab: supervision PT, supervision to min with OT Expected length of stay: ELOS 10 to 12 days Pt/Family Agrees to Admission and willing to participate: Yes Program Orientation Provided & Reviewed with Pt/Caregiver Including Roles  & Responsibilities: Yes  Decrease burden of Care through IP rehab admission: n/a  Possible need for SNF placement upon discharge: not anticipated  Patient Condition: I have reviewed medical records from [facility], spoken with CM, and patient and spouse. I met with patient at  the bedside for inpatient rehabilitation assessment.  Patient will benefit from ongoing PT and OT, can actively participate in 3 hours of therapy a day 5 days of the week, and can make measurable gains during the admission.  Patient will also benefit from the coordinated team approach during an Inpatient Acute Rehabilitation admission.  The patient will receive intensive therapy as well as Rehabilitation physician, nursing, social worker, and care management interventions.  Due to bladder management, bowel management, safety, skin/wound care, disease management, medication administration, pain management, and patient education the patient requires 24 hour a day rehabilitation nursing.  The patient is currently mod assist overall with mobility and basic ADLs.  Discharge setting and therapy post discharge at home with home health is anticipated.  Patient has agreed to participate in the Acute Inpatient Rehabilitation Program and will admit 08/31/21 when bed is available.   Preadmission Screen Completed By:  Mathew Oliver, 08/29/2021 4:44 PM ______________________________________________________________________   Discussed status with Dr. Curlene Dolphin on 08/29/21 at 1645 and received approval for admission 08/31/21 when bed is available.  Admission Coordinator:  Mathew Oliver  Jefm Bryant, RN, time 1645 Date 08/29/21   Assessment/Plan: Diagnosis: Does the need for close, 24 hr/day Medical supervision in concert with the patient's rehab needs make it unreasonable for this patient to be served in a less intensive setting? {yes_no_potentially:3041433} Co-Morbidities requiring supervision/potential complications: *** Due to {due ZU:3672550}, does the patient require 24 hr/day rehab nursing? {yes_no_potentially:3041433} Does the patient require coordinated care of a physician, rehab nurse, PT, OT, and SLP to address physical and functional deficits in the context of the above medical diagnosis(es)?  {yes_no_potentially:3041433} Addressing deficits in the following areas: {deficits:3041436} Can the patient actively participate in an intensive therapy program of at least 3 hrs of therapy 5 days a week? {yes_no_potentially:3041433} The potential for patient to make measurable gains while on inpatient rehab is {potential:3041437} Anticipated functional outcomes upon discharge from inpatient rehab: {functional outcomes:304600100} PT, {functional outcomes:304600100} OT, {functional outcomes:304600100} SLP Estimated rehab length of stay to reach the above functional goals is: *** Anticipated discharge destination: {anticipated dc setting:21604} 10. Overall Rehab/Functional Prognosis: {potential:3041437}   MD Signature: ***

## 2021-08-27 NOTE — Progress Notes (Signed)
PROGRESS NOTE    Mathew Oliver  [id number] DOB: 1961/03/15 DOA: 08/23/2021 PCP: Merryl Hacker, No    Brief Narrative:   Mathew Oliver is a 61 y.o. male with past medical history significant for essential hypertension, hyperlipidemia, coronary disease, chronic systolic congestive heart failure  obesity presented to the hospital with left elbow pain following a fall after losing his balance.  He hit his left elbow and hip on fall.  Patient was brought into the hospital on a splint.  In the ED, patient was seen as a level 2 trauma.  Vitals with mild tachypnea with pulse ox of 97% 2 L nasal cannula.  WBC was mildly elevated at 12.6 with hemoglobin of 14.7.   Lactic acid 2.0.  COVID-19 PCR negative.  Influenza A/B PCR negative.  Urinalysis was negative for infection. Pelvis x-ray with left pubic bone, superior pubic ramus and inferior pubic ramus fractures.  Left humerus x-ray with comminuted, displaced distal left humeral fracture.  CT left upper extremity with comminuted intra-articular fracture/dislocation at the elbow with severely displaced distal humerus fracture, transverse supracondylar component with splitting of the epicondyles, partial reticulation of the radius with a capitulum, nondisplaced radial head fracture, large joint effusion and probable hematoma, extensive soft tissue swelling and soft tissue gas suggesting open fracture.  CT head/C-spine with no acute intracranial abnormality, no acute/traumatic cervical spine pathology.  CT chest abdomen/pelvis with pelvic fractures including left superior/inferior pubic rami and left sacrum, otherwise no evidence of significant trauma or acute findings in the chest/abdomen/pelvis.  Orthopedics was consulted and patient was admitted hospital for further evaluation and treatment.   During hospitalization, patient underwent ORIF of the left humerus on 08/25/2021.  Currently awaiting for CIR placement  Assessment & Plan:  Principal Problem:   Open  fracture of left distal humerus Active Problems:   Accidental fall from ladder   Multiple closed stable fractures of pubic ramus (HCC)   Sacral fracture (HCC)   Lactic acidosis   Leukocytosis   Heart failure with reduced ejection fraction (HCC)   Essential hypertension   CAD S/P percutaneous coronary angioplasty   Obesity, Class III, BMI 40-49.9 (morbid obesity) ([place])   Vitamin D deficiency   Left open comminuted distal humerus fracture Status post fall from a ladder.  Underwent open reduction internal fixation of the left distal femur fracture, primary repair of partial triceps tendon tear, ulnar osteotomy and irrigation and debridement of the left open distal humerus fracture on 08/25/2021 by orthopedics.  Orthopedics recommended nonweight bearing.  Continue supportive care and analgesia.  Sacrum, pubic rami fracture Pelvis x-ray with left pubic bone fracture, superior pubic rami fracture, inferior pubic ramus fracture orthopedics has seen the patient and recommended weightbearing as tolerated.  PT OT recommended CIR at this time  Acute hypoxic respiratory failure Improved.  Was initially on supplemental oxygen.  Has been weaned to room air at this time.  Chest x-ray initially showed with cardiomegaly and mild vascular congestion.  History of  chronic congestive heart failure.  2D echocardiogram with systolic dysfunction.  Patient received IV Lasix x2 during hospitalization.  Leukocytosis/Lactic acidosis: Resolved Thought to be likely reactive.  Received IV fluids.  Off IV fluids.  Chronic systolic congestive heart failure,   2D echocardiogram done on 08/24/2021 showed LV ejection fraction of 40 to 45% with grade 2 diastolic dysfunction.  No significant changes prior to compare.  Currently on Coreg, spironolactone and lisinopril.  Received IV Lasix x2 during hospitalization.  Currently negative balance.  Constipation.  We will continue with MiraLAX and Colace   Essential  hypertension Continue Coreg lisinopril, spironolactone.  CAD s/p PCI Patient with previous history of CAD status post stent 2009.  Continue aspirin statins and beta-blockers.  Follow-up with cardiology as outpatient.  No active issues at this time  Hyperlipidemia: Continue Lipitor.  Morbid obesity Body mass index is 44.01 kg/m.  Would benefit from weight loss as outpatient.  Vitamin D deficiency.  Continue 50,000 unit vitamin D weekly.   DVT prophylaxis: Place and maintain sequential compression device Start: 08/26/21 0824 SCDs Start: 08/25/21 1552  Lovenox subcu    Code Status: Full Code  Family Communication:  Spoke with the patient at bedside   Disposition Plan:  Level of care: Telemetry Medical  Status is: Inpatient  Remains inpatient appropriate because: Plan for CIR placement status post ORIF.  Medically stable for disposition.  Consultants:  Orthopedics, Dr. Doreatha Martin  Procedures:  Open reduction internal fixation of the left distal femur fracture, primary repair of partial triceps tendon tear, ulnar osteotomy and irrigation and debridement of the left open distal humerus fracture on 08/25/2021 by orthopedics.   Antimicrobials:  Cefazolin for surgical prophylaxis  Subjective: Today, patient was seen and examined at bedside.  Patient denies any pain, nausea, vomiting, fever or chills.  Feels constipated.  Objective: Vitals:   08/27/21 0727 08/27/21 0805 08/27/21 0850 08/27/21 1143  BP: 127/77     Pulse: 83     Resp:  20 18 18   Temp: 98.5 F (36.9 C)     TempSrc: Oral     SpO2: 90% 91% 96%   Weight:      Height:        Intake/Output Summary (Last 24 hours) at 08/27/2021 1146 Last data filed at 08/26/2021 1601 Gross per 24 hour  Intake 340 ml  Output 2400 ml  Net -2060 ml    Filed Weights   08/23/21 0100 08/24/21 0845 08/26/21 0500  Weight: 136.1 kg (!) 139.7 kg (!) 147.2 kg   Body mass index is 44.01 kg/m.   Examination: General: Morbidly obese  built, not in obvious distress HENT:   No scleral pallor or icterus noted. Oral mucosa is moist.  On nasal cannula oxygen Chest:  Diminished breath sounds bilaterally. No crackles or wheezes.  CVS: S1 &S2 heard. No murmur.  Regular rate and rhythm. Abdomen: Soft, nontender, nondistended.  Bowel sounds are heard.   Extremities: No cyanosis, clubbing or edema.  Peripheral pulses are palpable.  Left upper extremity in a splint. Psych: Alert, awake and oriented, normal mood CNS:  No cranial nerve deficits.  Moves all extremities. Skin: Warm and dry.  No rashes noted.   Data Reviewed: I have personally reviewed following labs and imaging studies  CBC: Recent Labs  Lab 08/24/21 0239 08/24/21 2209 08/25/21 0058 08/26/21 0126 08/27/21 0151  WBC 12.5* 12.5* 10.9* 13.5* 10.1  HGB 13.2 12.7* 12.2* 12.9* 11.8*  HCT 39.7 36.6* 36.7* 38.5* 34.4*  MCV 94.3 94.1 94.3 93.9 93.5  PLT 170 169 164 198 [id number]    Basic Metabolic Panel: Recent Labs  Lab 08/24/21 0239 08/24/21 2209 08/25/21 0058 08/26/21 0126 08/27/21 0151  NA 136 136 137 136 136  K 3.9 3.9 3.7 4.2 3.9  CL 104 100 102 102 99  CO2 26 28 28 27  32  GLUCOSE 183* 164* 177* 186* 153*  BUN 15 20 20 14 17   CREATININE 0.79 1.01 0.96 0.64 0.68  CALCIUM 8.2* 8.3* 8.4* 8.3* 8.3*  MG  --   --  2.2 2.2  --     GFR: Estimated Creatinine Clearance: 144.6 mL/min (by C-G formula based on SCr of 0.68 mg/dL). Liver Function Tests: Recent Labs  Lab 08/23/21 0114 08/24/21 2209  AST 40 18  ALT 33 20  ALKPHOS 49 44  BILITOT 0.6 0.9  PROT 5.8* 5.5*  ALBUMIN 3.3* 2.8*    No results for input(s): "LIPASE", "AMYLASE" in the last 168 hours. No results for input(s): "AMMONIA" in the last 168 hours. Coagulation Profile: Recent Labs  Lab 08/23/21 0114  INR 1.0    Cardiac Enzymes: Recent Labs  Lab 08/23/21 1714  CKTOTAL 252    BNP (last 3 results) No results for input(s): "PROBNP" in the last 8760 hours. HbA1C: No results for  input(s): "HGBA1C" in the last 72 hours.  CBG: No results for input(s): "GLUCAP" in the last 168 hours. Lipid Profile: No results for input(s): "CHOL", "HDL", "LDLCALC", "TRIG", "CHOLHDL", "LDLDIRECT" in the last 72 hours. Thyroid Function Tests: No results for input(s): "TSH", "T4TOTAL", "FREET4", "T3FREE", "THYROIDAB" in the last 72 hours. Anemia Panel: No results for input(s): "VITAMINB12", "FOLATE", "FERRITIN", "TIBC", "IRON", "RETICCTPCT" in the last 72 hours. Sepsis Labs: Recent Labs  Lab 08/23/21 0114 08/23/21 1714 08/24/21 2209 08/25/21 0058  PROCALCITON  --   --  <0.10  --   LATICACIDVEN 2.0* 1.6 1.2 0.9     Recent Results (from the past 240 hour(s))  Resp Panel by RT-PCR (Flu A&B, Covid) Anterior Nasal Swab     Status: None   Collection Time: 08/23/21  1:30 AM   Specimen: Anterior Nasal Swab  Result Value Ref Range Status   SARS Coronavirus 2 by RT PCR NEGATIVE NEGATIVE Final    Comment: (NOTE) SARS-CoV-2 target nucleic acids are NOT DETECTED.  The SARS-CoV-2 RNA is generally detectable in upper respiratory specimens during the acute phase of infection. The lowest concentration of SARS-CoV-2 viral copies this assay can detect is 138 copies/mL. A negative result does not preclude SARS-Cov-2 infection and should not be used as the sole basis for treatment or other patient management decisions. A negative result may occur with  improper specimen collection/handling, submission of specimen other than nasopharyngeal swab, presence of viral mutation(s) within the areas targeted by this assay, and inadequate number of viral copies(<138 copies/mL). A negative result must be combined with clinical observations, patient history, and epidemiological information. The expected result is Negative.  Fact Sheet for Patients:  EntrepreneurPulse.com.au  Fact Sheet for Healthcare Providers:  IncredibleEmployment.be  This test is no t yet  approved or cleared by the [place] FDA and  has been authorized for detection and/or diagnosis of SARS-CoV-2 by FDA under an Emergency Use Authorization (EUA). This EUA will remain  in effect (meaning this test can be used) for the duration of the COVID-19 declaration under Section 564(b)(1) of the Act, 21 U.S.C.section 360bbb-3(b)(1), unless the authorization is terminated  or revoked sooner.       Influenza A by PCR NEGATIVE NEGATIVE Final   Influenza B by PCR NEGATIVE NEGATIVE Final    Comment: (NOTE) The Xpert Xpress SARS-CoV-2/FLU/RSV plus assay is intended as an aid in the diagnosis of influenza from Nasopharyngeal swab specimens and should not be used as a sole basis for treatment. Nasal washings and aspirates are unacceptable for Xpert Xpress SARS-CoV-2/FLU/RSV testing.  Fact Sheet for Patients: EntrepreneurPulse.com.au  Fact Sheet for Healthcare Providers: IncredibleEmployment.be  This test is not yet approved or cleared by the [place] and has been authorized for  detection and/or diagnosis of SARS-CoV-2 by FDA under an Emergency Use Authorization (EUA). This EUA will remain in effect (meaning this test can be used) for the duration of the COVID-19 declaration under Section 564(b)(1) of the Act, 21 U.S.C. section 360bbb-3(b)(1), unless the authorization is terminated or revoked.  Performed at [facility], [place] [address] [place], [place] [address]   Surgical pcr screen     Status: Abnormal   Collection Time: 08/24/21 10:54 PM   Specimen: Nasal Mucosa; Nasal Swab  Result Value Ref Range Status   MRSA, PCR NEGATIVE NEGATIVE Final   Staphylococcus aureus POSITIVE (A) NEGATIVE Final    Comment: (NOTE) The Xpert SA Assay (FDA approved for NASAL specimens in patients 4 years of age and older), is one component of a comprehensive surveillance program. It is not intended to diagnose infection nor to guide or  monitor treatment. Performed at [facility], [place] [address], [place], [place] [address]      Radiology Studies: DG Elbow 2 Views Left  Result Date: 08/25/2021 CLINICAL DATA:  Fracture distal left humerus EXAM: LEFT ELBOW - 2 VIEW COMPARISON:  08/23/2021 FINDINGS: There is interval reduction and internal fixation of comminuted fractures of distal humerus. There is marked improvement in alignment of fracture fragments. There is surgical screw in the proximal ulna. IMPRESSION: Reduction and internal fixation of comminuted displaced fracture of distal left humerus. Electronically Signed   By: Elmer Picker M.D.   On: 08/25/2021 16:35   DG Humerus Left  Result Date: 08/25/2021 CLINICAL DATA:  Distal humeral ORIF EXAM: LEFT HUMERUS - 2+ VIEW COMPARISON:  None Available. FINDINGS: Multiple intraoperative fluoroscopic spot images are provided. Comminuted distal humeral fracture transfixed with a dorsal and medial sideplate with multiple interlocking screws. FLUOROSCOPY TIME:  Radiation Exposure Index: 2.83 mGy IMPRESSION: 1. Interval ORIF of a left distal humeral fracture. Electronically Signed   By: Kathreen Devoid M.D.   On: 08/25/2021 14:53   DG C-Arm 1-60 Min-No Report  Result Date: 08/25/2021 Fluoroscopy was utilized by the requesting physician.  No radiographic interpretation.   DG C-Arm 1-60 Min-No Report  Result Date: 08/25/2021 Fluoroscopy was utilized by the requesting physician.  No radiographic interpretation.      Scheduled Meds:  aspirin EC  325 mg Oral QHS   atorvastatin  40 mg Oral QPM   carvedilol  25 mg Oral BID WC   Chlorhexidine Gluconate Cloth  6 each Topical Daily   cholecalciferol  1,000 Units Oral Daily   docusate sodium  100 mg Oral BID   enoxaparin (LOVENOX) injection  70 mg Subcutaneous Q24H   lisinopril  40 mg Oral QPM   mupirocin ointment  1 application  Nasal BID   polyethylene glycol  17 g Oral Daily   sodium chloride flush  3 mL Intravenous Q12H    spironolactone  25 mg Oral QPM   traMADol  50 mg Oral Q6H   Vitamin D (Ergocalciferol)  50,000 Units Oral Q7 days   Continuous Infusions:  methocarbamol (ROBAXIN) IV       LOS: 4 days   Flora Lipps, MD [facility] Available via Epic secure chat 7am-7pm After these hours, please refer to coverage provider listed on [email or web address] 08/27/2021, 11:46 AM .

## 2021-08-27 NOTE — Progress Notes (Signed)
Physical Therapy Treatment Patient Details Name: Mathew Oliver MRN: [id number] DOB: 25-Jan-1961 Today's Date: 08/27/2021   History of Present Illness 61 yo male presenting 6/10 after sustaining a fall off of a ladder with pelvic fractures including left superior, inferior pubic rami, and left sacrum. Pt also with comminuted L distal humerus fx.  ORIF left UE 6/12. PMH includes: tobacco use, MI.    PT Comments    Pt tolerated treatment fair today and was limited by fatigue and pain. Pt made gains with his mobility today by performing bed to chair transfer but had to utilize [place] and he had difficulty performing supine to sit after rolling to left side. Will attempt supine to sit to R side of bed next treatment session.  Recommendations for follow up therapy are one component of a multi-disciplinary discharge planning process, led by the attending physician.  Recommendations may be updated based on patient status, additional functional criteria and insurance authorization.  Follow Up Recommendations  Acute inpatient rehab (3hours/day)     Assistance Recommended at Discharge Frequent or constant Supervision/Assistance  Patient can return home with the following Two people to help with walking and/or transfers;Two people to help with bathing/dressing/bathroom;Assistance with cooking/housework;Assistance with feeding;Direct supervision/assist for medications management;Direct supervision/assist for financial management;Assist for transportation;Help with stairs or ramp for entrance   Equipment Recommendations  Wheelchair (measurements PT);Wheelchair cushion (measurements PT)    Recommendations for Other Services Rehab consult     Precautions / Restrictions Precautions Precautions: Fall Required Braces or Orthoses: Sling (L UE) Restrictions Weight Bearing Restrictions: Yes LUE Weight Bearing: Non weight bearing     Mobility  Bed Mobility Overal bed mobility: Needs Assistance Bed  Mobility: Supine to Sit     Supine to sit: Max assist, +2 for physical assistance     General bed mobility comments: Pt utilized log roll technique to left side before arising to sitting position. Pt with limited ability to perform transfer effectively due to decreased core/trunk strength and control as well as inability to utilize L elbow. Will plan on performing supine to sit transfer to R side next session.    Transfers Overall transfer level: Needs assistance   Transfers: Sit to/from Stand Sit to Stand: Mod assist, +2 physical assistance, +2 safety/equipment           General transfer comment: Pt utilized stedy with R UE to pull with to arise to standing. Pt able to maintain erect standing position and shift weight M <> L but with limited foot clearance when attempting marches (slightly better shifting weight to R LE). Pt performed 3 additional sit to stands from stedy before fatiguing and requesting to sit down in chair. Cues provided during transfers for technique and safety awareness. Transfer via Lift Equipment: Stedy  Ambulation/Gait               General Gait Details: pt unable   Stairs             Wheelchair Mobility    Modified Rankin (Stroke Patients Only)       Balance Overall balance assessment: Needs assistance Sitting-balance support: Single extremity supported, Feet supported Sitting balance-Leahy Scale: Fair Sitting balance - Comments: pt able to maintain static sitting balance at midline with single arm support   Standing balance support: Single extremity supported   Standing balance comment: pt able to maintain good static standing balance with R UE on stedy but fatigued quickly  Cognition Arousal/Alertness: Awake/alert Behavior During Therapy: WFL for tasks assessed/performed Overall Cognitive Status: Within Functional Limits for tasks assessed                                           Exercises General Exercises - Lower Extremity Ankle Circles/Pumps: AROM, Both, 5 reps, Seated Long Arc Quad: AROM, Both, Seated, 10 reps Hip Flexion/Marching: AROM, Both, 10 reps (assistance by therapist for L LE)    General Comments General comments (skin integrity, edema, etc.): VSS with pt on 2L O2 (SpO2 93% but upon arrival pt's oxygen not on and SpO2 in high 80s)      Pertinent Vitals/Pain Pain Assessment Faces Pain Scale: Hurts even more Breathing: normal Facial Expression: facial grimacing Body Language: tense, distressed pacing, fidgeting Consolability: distracted or reassured by voice/touch Pain Location: L elbow, hip    Home Living                          Prior Function            PT Goals (current goals can now be found in the care plan section) Acute Rehab PT Goals Patient Stated Goal: reduce pain Progress towards PT goals: Progressing toward goals    Frequency    Min 4X/week      PT Plan Current plan remains appropriate    Co-evaluation              AM-PAC PT "6 Clicks" Mobility   Outcome Measure  Help needed turning from your back to your side while in a flat bed without using bedrails?: A Lot Help needed moving from lying on your back to sitting on the side of a flat bed without using bedrails?: A Lot Help needed moving to and from a bed to a chair (including a wheelchair)?: A Lot Help needed standing up from a chair using your arms (e.g., wheelchair or bedside chair)?: A Lot Help needed to walk in hospital room?: Total Help needed climbing 3-5 steps with a railing? : Total 6 Click Score: 10    End of Session Equipment Utilized During Treatment: Gait belt Activity Tolerance: Patient limited by fatigue;Patient limited by pain Patient left: with call bell/phone within reach;in chair Nurse Communication: Mobility status;Need for lift equipment (maximove) PT Visit Diagnosis: Unsteadiness on feet (R26.81);Other  abnormalities of gait and mobility (R26.89);Muscle weakness (generalized) (M62.81);Pain Pain - Right/Left: Right Pain - part of body: Arm (L leg/pelvis)     Time: [id number] PT Time Calculation (min) (ACUTE ONLY): 32 min  Charges:  $Therapeutic Exercise: 8-22 mins $Therapeutic Activity: 8-22 mins                     Donna Bernard, PT    [facility] 08/27/2021, 1:55 PM

## 2021-08-27 NOTE — Progress Notes (Signed)
Orthopaedic Trauma Progress Note  SUBJECTIVE: Sleeping this morning. Arm resting comfortably in sling on his abdomen.   OBJECTIVE:  Vitals:   08/27/21 0805 08/27/21 0850  BP:    Pulse:    Resp: 20 18  Temp:    SpO2: 91% 96%    General: Sleeping. no acute distress Respiratory: No increased work of breathing.  Left upper extremity: Sling in place.  Dressing clean, dry, intact.  Hand warm and well-perfused.  Brisk cap refill  IMAGING: Stable post op imaging.   LABS:  Results for orders placed or performed during the hospital encounter of 08/23/21 (from the past 24 hour(s))  Basic metabolic panel     Status: Abnormal   Collection Time: 08/27/21  1:51 AM  Result Value Ref Range   Sodium 136 135 - 145 mmol/L   Potassium 3.9 3.5 - 5.1 mmol/L   Chloride 99 98 - 111 mmol/L   CO2 32 22 - 32 mmol/L   Glucose, Bld 153 (H) 70 - 99 mg/dL   BUN 17 8 - 23 mg/dL   Creatinine, Ser 3.33 0.61 - 1.24 mg/dL   Calcium 8.3 (L) 8.9 - 10.3 mg/dL   GFR, Estimated >54 >56 mL/min   Anion gap 5 5 - 15  CBC     Status: Abnormal   Collection Time: 08/27/21  1:51 AM  Result Value Ref Range   WBC 10.1 4.0 - 10.5 K/uL   RBC 3.68 (L) 4.22 - 5.81 MIL/uL   Hemoglobin 11.8 (L) 13.0 - 17.0 g/dL   HCT 25.6 (L) 38.9 - 37.3 %   MCV 93.5 80.0 - 100.0 fL   MCH 32.1 26.0 - 34.0 pg   MCHC 34.3 30.0 - 36.0 g/dL   RDW 42.8 76.8 - 11.5 %   Platelets 214 150 - 400 K/uL   nRBC 0.0 0.0 - 0.2 %    ASSESSMENT: Mathew Oliver is a 61 y.o. male, 2 Days Post-Op s/p OPEN REDUCTION INTERNAL FIXATION LEFT DISTAL HUMERUS FRACTURE  CV/Blood loss: Hemoglobin 12.9 this morning, stable from preop.  Hemodynamically stable  PLAN: Weightbearing: NWB LUE ROM: Okay for unrestricted active/passive motion as tolerated Incisional and dressing care: Dressing CDI. Plan to remove later today vs tomorrow Showering: Okay to begin showering getting incisions wet 08/28/2021 if no drainage from incisions Orthopedic device(s):  Sling for  comfort   Pain management:  1. Tylenol 650 mg q 6 hours PRN 2. Robaxin 500 mg q 6 hours PRN 3. Percocet 5-325 mg (1-2 tabs) q 4 hours PRN 4. Dilaudid 0.5-1 mg q 4 hours PRN 5. Tramadol 50 mg q 6 hours scheduled VTE prophylaxis: Lovenox, SCDs ID:  Ancef 2gm post op completed Foley/Lines:  No foley, KVO IVFs Impediments to Fracture Healing: Vitamin D level 20, started on supplementation Dispo: PT/OT evaluation ongoing, currently recommending CIR.  Admission coordinator following.  Okay for discharge from ortho standpoint once cleared by medicine team and therapies D/C recommendations: - Percocet for pain control - Eliquis 2.5 mg twice daily x30 days for DVT prophylaxis - Continue 1000 units daily Vit D supplementation x 90 days  Follow - up plan: 2 weeks after discharge for wound check and repeat x-rays   Contact information:  Truitt Merle MD, Thyra Breed PA-C. After hours and holidays please check [email or web address] for group call information for [facility]   Thompson Caul, PA-C [phone] (office) [email or web address]

## 2021-08-27 NOTE — Progress Notes (Signed)
Inpatient Rehabilitation Admissions Coordinator   I await CIR bed availability to admit to CIR/Air  this week. Discussed with Glee Arvin Case Manager at [phone].  Ottie Glazier, RN, MSN Rehab Admissions Coordinator [phone] 08/27/2021 12:04 PM

## 2021-08-28 ENCOUNTER — Encounter (HOSPITAL_COMMUNITY): Payer: Self-pay | Admitting: Student

## 2021-08-28 NOTE — Progress Notes (Signed)
Physical Therapy Treatment Patient Details Name: Mathew Oliver MRN: [id number] DOB: 05-Jun-1960 Today's Date: 08/28/2021   History of Present Illness 61 yo male presenting 6/10 after sustaining a fall off of a ladder with pelvic fractures including left superior, inferior pubic rami, and left sacrum. Pt also with comminuted L distal humerus fx.  ORIF left UE 6/12. PMH includes: tobacco use, MI.    PT Comments    Pt able to improve bed mobility function today when getting OOB to R side. Pt also required less assistance for sit to stands but his L LE remains weak which is limiting his weight acceptance during pre-gait activities. Will plan on progressing pt to performing sit to stand without Stedy and possibly quad cane.   Recommendations for follow up therapy are one component of a multi-disciplinary discharge planning process, led by the attending physician.  Recommendations may be updated based on patient status, additional functional criteria and insurance authorization.  Follow Up Recommendations  Acute inpatient rehab (3hours/day)     Assistance Recommended at Discharge Frequent or constant Supervision/Assistance  Patient can return home with the following Two people to help with walking and/or transfers;Two people to help with bathing/dressing/bathroom;Assistance with cooking/housework;Assistance with feeding;Direct supervision/assist for medications management;Direct supervision/assist for financial management;Assist for transportation;Help with stairs or ramp for entrance   Equipment Recommendations  Wheelchair (measurements PT);Wheelchair cushion (measurements PT)    Recommendations for Other Services Rehab consult     Precautions / Restrictions Precautions Precautions: Fall Required Braces or Orthoses: Sling (L UE) Restrictions Weight Bearing Restrictions: Yes LUE Weight Bearing: Non weight bearing     Mobility  Bed Mobility Overal bed mobility: Needs Assistance Bed  Mobility: Supine to Sit     Supine to sit: Mod assist     General bed mobility comments: Pt utilized log roll technique to right side before arising to sitting position with use of bed rail. Pt with improved ability to perform transfer compared to yesterday but still limited due to decreased core/trunk strength and control.    Transfers Overall transfer level: Needs assistance   Transfers: Sit to/from Stand Sit to Stand: +2 physical assistance, +2 safety/equipment, Min assist           General transfer comment: Pt utilized Stedy with R UE to pull with to arise to standing. Pt performed three sit to stands total and also performed weight shifting M <> L. Pt with increased pain and weakness when shifting to L and unable to lift R LE but able to lift R heel up. Pt also able to perform static standing balance without UE use on Stedy. Transfer via Lift Equipment: Stedy  Ambulation/Gait               General Gait Details: pt unable   Stairs             Wheelchair Mobility    Modified Rankin (Stroke Patients Only)       Balance Overall balance assessment: Needs assistance Sitting-balance support: Single extremity supported, Feet supported Sitting balance-Leahy Scale: Fair     Standing balance support: Single extremity supported, No upper extremity supported   Standing balance comment: pt able to maintain good static standing balance with and without R UE on stedy                            Cognition Arousal/Alertness: Awake/alert Behavior During Therapy: WFL for tasks assessed/performed Overall Cognitive Status: Within Functional Limits  for tasks assessed                                          Exercises General Exercises - Lower Extremity Long Arc Quad: AROM, Both, Seated, 10 reps (12 reps on R) Hip Flexion/Marching: AROM, Both, 10 reps (8 reps on L) Toe Raises: AROM, Both, 10 reps, Seated Heel Raises: AROM, Both, 10 reps,  Seated    General Comments General comments (skin integrity, edema, etc.): 94 HR and 98% SpO2 with pt on RA      Pertinent Vitals/Pain Pain Assessment Pain Score: 5  Negative Vocalization: occasional moan/groan, low speech, negative/disapproving quality Facial Expression: facial grimacing Body Language: tense, distressed pacing, fidgeting Consolability: distracted or reassured by voice/touch Pain Location: L elbow, hip    Home Living                          Prior Function            PT Goals (current goals can now be found in the care plan section) Acute Rehab PT Goals Patient Stated Goal: reduce pain Progress towards PT goals: Progressing toward goals    Frequency    Min 4X/week      PT Plan Current plan remains appropriate    Co-evaluation              AM-PAC PT "6 Clicks" Mobility   Outcome Measure  Help needed turning from your back to your side while in a flat bed without using bedrails?: A Little Help needed moving from lying on your back to sitting on the side of a flat bed without using bedrails?: A Little Help needed moving to and from a bed to a chair (including a wheelchair)?: A Lot Help needed standing up from a chair using your arms (e.g., wheelchair or bedside chair)?: A Lot Help needed to walk in hospital room?: Total Help needed climbing 3-5 steps with a railing? : Total 6 Click Score: 12    End of Session   Activity Tolerance: Patient limited by fatigue;Patient limited by pain;Patient tolerated treatment well Patient left: with call bell/phone within reach;in chair Nurse Communication: Mobility status (maximove) PT Visit Diagnosis: Unsteadiness on feet (R26.81);Other abnormalities of gait and mobility (R26.89);Muscle weakness (generalized) (M62.81);Pain Pain - Right/Left: Right Pain - part of body: Arm (L leg/pelvis)     Time: 1000-1029 PT Time Calculation (min) (ACUTE ONLY): 29 min  Charges:  $Therapeutic Exercise:  8-22 mins $Therapeutic Activity: 8-22 mins                     Tana Coast, PT    [employer] 08/28/2021, 11:50 AM

## 2021-08-28 NOTE — Progress Notes (Signed)
PROGRESS NOTE    Mathew Oliver  XLK:[id number] DOB: July 12, 1960 DOA: 08/23/2021 PCP: Oneita Hurt, No    Brief Narrative:   Mathew Oliver is a 61 y.o. male with past medical history significant for essential hypertension, hyperlipidemia, coronary disease, chronic systolic congestive heart failure  obesity presented to the hospital with left elbow pain following a fall after losing his balance.  He hit his left elbow and hip on fall.  Patient was brought into the hospital on a splint.  In the ED, patient was seen as a level 2 trauma.  Vitals with mild tachypnea with pulse ox of 97% 2 L nasal cannula.  WBC was mildly elevated at 12.6 with hemoglobin of 14.7.   Lactic acid 2.0.  COVID-19 PCR negative.  Influenza A/B PCR negative.  Urinalysis was negative for infection. Pelvis x-ray with left pubic bone, superior pubic ramus and inferior pubic ramus fractures.  Left humerus x-ray with comminuted, displaced distal left humeral fracture.  CT left upper extremity with comminuted intra-articular fracture/dislocation at the elbow with severely displaced distal humerus fracture, transverse supracondylar component with splitting of the epicondyles, partial reticulation of the radius with a capitulum, nondisplaced radial head fracture, large joint effusion and probable hematoma, extensive soft tissue swelling and soft tissue gas suggesting open fracture.  CT head/C-spine with no acute intracranial abnormality, no acute/traumatic cervical spine pathology.  CT chest abdomen/pelvis with pelvic fractures including left superior/inferior pubic rami and left sacrum, otherwise no evidence of significant trauma or acute findings in the chest/abdomen/pelvis.  Orthopedics was consulted and patient was admitted hospital for further evaluation and treatment.   During hospitalization, patient underwent ORIF of the left humerus on 08/25/2021.  Currently, awaiting for CIR placement  Assessment & Plan:  Principal Problem:   Open  fracture of left distal humerus Active Problems:   Accidental fall from ladder   Multiple closed stable fractures of pubic ramus (HCC)   Sacral fracture (HCC)   Lactic acidosis   Leukocytosis   Heart failure with reduced ejection fraction (HCC)   Essential hypertension   CAD S/P percutaneous coronary angioplasty   Obesity, Class III, BMI 40-49.9 (morbid obesity) (HCC)   Vitamin D deficiency   Left open comminuted distal humerus fracture Status post mechanical fall.  Patient underwent open reduction internal fixation of the left distal femur fracture, primary repair of partial triceps tendon tear, ulnar osteotomy and irrigation and debridement of the left open distal humerus fracture on 08/25/2021 by orthopedics.  Orthopedics recommended nonweight bearing of the affected extremity.  Continue supportive care and analgesia.  Sacrum, pubic rami fracture Pelvis x-ray with left pubic bone fracture, superior pubic rami fracture, inferior pubic ramus fracture orthopedics has seen the patient and recommended weightbearing as tolerated.  PT OT has recommended CIR at this time  Acute hypoxic respiratory failure Improved.  Was initially on supplemental oxygen.  Has been weaned to room air at this time.  Chest x-ray initially showed with cardiomegaly and mild vascular congestion.  History of  chronic congestive heart failure.  2D echocardiogram with systolic dysfunction.  Patient received IV Lasix x2 during hospitalization.  Leukocytosis/Lactic acidosis: Resolved Thought to be likely reactive.  Latest WBC at 10.1.  Chronic systolic congestive heart failure,   2D echocardiogram done on 08/24/2021 showed LV ejection fraction of 40 to 45% with grade 2 diastolic dysfunction.  No significant changes compared to prior.  Currently on Coreg, spironolactone and lisinopril.  Received IV Lasix x2 during hospitalization.  Currently negative balance.  Constipation.  We will continue with MiraLAX and Colace    Essential hypertension Continue Coreg lisinopril, spironolactone.  Test blood pressure 126/76  CAD s/p PCI Patient with previous history of CAD status post stent 2009.  Continue aspirin, statins and beta-blockers.  Follow-up with cardiology as outpatient.  No active issues at this time  Hyperlipidemia: Continue Lipitor.  Morbid obesity Body mass index is 44.01 kg/m.  Would benefit from weight loss as outpatient.  Vitamin D deficiency.  Continue 50,000 unit vitamin D weekly.   DVT prophylaxis: Place and maintain sequential compression device Start: 08/26/21 0824 SCDs Start: 08/25/21 1552  Lovenox subcu    Code Status: Full Code  Family Communication:  Spoke with the patient at bedside   Disposition Plan: CIR Level of care: Telemetry Medical  Status is: Inpatient  Remains inpatient appropriate because: Plan for CIR placement status post ORIF.  Medically stable for disposition.  Consultants:  Orthopedics, Dr. Doreatha Martin  Procedures:  Open reduction internal fixation of the left distal femur fracture, primary repair of partial triceps tendon tear, ulnar osteotomy and irrigation and debridement of the left open distal humerus fracture on 08/25/2021 by orthopedics.   Antimicrobials:  Cefazolin for surgical prophylaxis  Subjective: Today, patient was seen and examined at bedside.  Patient denies interval complaints.  Has mild intermittent left upper extremity pain.    Objective: Vitals:   08/27/21 1511 08/27/21 1742 08/27/21 2044 08/28/21 0746  BP: 111/67  95/60 126/76  Pulse: 71  65 77  Resp:  18 19 18   Temp: 97.9 F (36.6 C)  98 F (36.7 C) 97.9 F (36.6 C)  TempSrc: Oral  Oral Oral  SpO2: 97%  91% 92%  Weight:      Height:        Intake/Output Summary (Last 24 hours) at 08/28/2021 1254 Last data filed at 08/28/2021 1100 Gross per 24 hour  Intake 243 ml  Output 425 ml  Net -182 ml    Filed Weights   08/23/21 0100 08/24/21 0845 08/26/21 0500  Weight: 136.1  kg (!) 139.7 kg (!) 147.2 kg   Body mass index is 44.01 kg/m.   Examination: General: Morbidly obese built, not in obvious distress HENT:   No scleral pallor or icterus noted. Oral mucosa is moist.  Chest:  Diminished breath sounds bilaterally. No crackles or wheezes.  CVS: S1 &S2 heard. No murmur.  Regular rate and rhythm. Abdomen: Soft, nontender, nondistended.  Bowel sounds are heard.   Extremities: No cyanosis, clubbing or edema.  Peripheral pulses are palpable.  Left upper extremity on a splint. Psych: Alert, awake and oriented, normal mood CNS:  No cranial nerve deficits.  Moves all extremities Skin: Warm and dry.  No rashes noted.   Data Reviewed: I have personally reviewed following labs and imaging studies  CBC: Recent Labs  Lab 08/24/21 0239 08/24/21 2209 08/25/21 0058 08/26/21 0126 08/27/21 0151  WBC 12.5* 12.5* 10.9* 13.5* 10.1  HGB 13.2 12.7* 12.2* 12.9* 11.8*  HCT 39.7 36.6* 36.7* 38.5* 34.4*  MCV 94.3 94.1 94.3 93.9 93.5  PLT 170 169 164 198 [id number]    Basic Metabolic Panel: Recent Labs  Lab 08/24/21 0239 08/24/21 2209 08/25/21 0058 08/26/21 0126 08/27/21 0151  NA 136 136 137 136 136  K 3.9 3.9 3.7 4.2 3.9  CL 104 100 102 102 99  CO2 26 28 28 27  32  GLUCOSE 183* 164* 177* 186* 153*  BUN 15 20 20 14 17   CREATININE 0.79 1.01 0.96 0.64  0.68  CALCIUM 8.2* 8.3* 8.4* 8.3* 8.3*  MG  --   --  2.2 2.2  --     GFR: Estimated Creatinine Clearance: 144.6 mL/min (by C-G formula based on SCr of 0.68 mg/dL). Liver Function Tests: Recent Labs  Lab 08/23/21 0114 08/24/21 2209  AST 40 18  ALT 33 20  ALKPHOS 49 44  BILITOT 0.6 0.9  PROT 5.8* 5.5*  ALBUMIN 3.3* 2.8*    No results for input(s): "LIPASE", "AMYLASE" in the last 168 hours. No results for input(s): "AMMONIA" in the last 168 hours. Coagulation Profile: Recent Labs  Lab 08/23/21 0114  INR 1.0    Cardiac Enzymes: Recent Labs  Lab 08/23/21 1714  CKTOTAL 252    BNP (last 3 results) No  results for input(s): "PROBNP" in the last 8760 hours. HbA1C: No results for input(s): "HGBA1C" in the last 72 hours.  CBG: No results for input(s): "GLUCAP" in the last 168 hours. Lipid Profile: No results for input(s): "CHOL", "HDL", "LDLCALC", "TRIG", "CHOLHDL", "LDLDIRECT" in the last 72 hours. Thyroid Function Tests: No results for input(s): "TSH", "T4TOTAL", "FREET4", "T3FREE", "THYROIDAB" in the last 72 hours. Anemia Panel: No results for input(s): "VITAMINB12", "FOLATE", "FERRITIN", "TIBC", "IRON", "RETICCTPCT" in the last 72 hours. Sepsis Labs: Recent Labs  Lab 08/23/21 0114 08/23/21 1714 08/24/21 2209 08/25/21 0058  PROCALCITON  --   --  <0.10  --   LATICACIDVEN 2.0* 1.6 1.2 0.9     Recent Results (from the past 240 hour(s))  Resp Panel by RT-PCR (Flu A&B, Covid) Anterior Nasal Swab     Status: None   Collection Time: 08/23/21  1:30 AM   Specimen: Anterior Nasal Swab  Result Value Ref Range Status   SARS Coronavirus 2 by RT PCR NEGATIVE NEGATIVE Final    Comment: (NOTE) SARS-CoV-2 target nucleic acids are NOT DETECTED.  The SARS-CoV-2 RNA is generally detectable in upper respiratory specimens during the acute phase of infection. The lowest concentration of SARS-CoV-2 viral copies this assay can detect is 138 copies/mL. A negative result does not preclude SARS-Cov-2 infection and should not be used as the sole basis for treatment or other patient management decisions. A negative result may occur with  improper specimen collection/handling, submission of specimen other than nasopharyngeal swab, presence of viral mutation(s) within the areas targeted by this assay, and inadequate number of viral copies(<138 copies/mL). A negative result must be combined with clinical observations, patient history, and epidemiological information. The expected result is Negative.  Fact Sheet for Patients:  EntrepreneurPulse.com.au  Fact Sheet for Healthcare  Providers:  IncredibleEmployment.be  This test is no t yet approved or cleared by the [place] FDA and  has been authorized for detection and/or diagnosis of SARS-CoV-2 by FDA under an Emergency Use Authorization (EUA). This EUA will remain  in effect (meaning this test can be used) for the duration of the COVID-19 declaration under Section 564(b)(1) of the Act, 21 U.S.C.section 360bbb-3(b)(1), unless the authorization is terminated  or revoked sooner.       Influenza A by PCR NEGATIVE NEGATIVE Final   Influenza B by PCR NEGATIVE NEGATIVE Final    Comment: (NOTE) The Xpert Xpress SARS-CoV-2/FLU/RSV plus assay is intended as an aid in the diagnosis of influenza from Nasopharyngeal swab specimens and should not be used as a sole basis for treatment. Nasal washings and aspirates are unacceptable for Xpert Xpress SARS-CoV-2/FLU/RSV testing.  Fact Sheet for Patients: EntrepreneurPulse.com.au  Fact Sheet for Healthcare Providers: IncredibleEmployment.be  This test  is not yet approved or cleared by the [place] and has been authorized for detection and/or diagnosis of SARS-CoV-2 by FDA under an Emergency Use Authorization (EUA). This EUA will remain in effect (meaning this test can be used) for the duration of the COVID-19 declaration under Section 564(b)(1) of the Act, 21 U.S.C. section 360bbb-3(b)(1), unless the authorization is terminated or revoked.  Performed at [facility], [address], [place], [place] [address]   Surgical pcr screen     Status: Abnormal   Collection Time: 08/24/21 10:54 PM   Specimen: Nasal Mucosa; Nasal Swab  Result Value Ref Range Status   MRSA, PCR NEGATIVE NEGATIVE Final   Staphylococcus aureus POSITIVE (A) NEGATIVE Final    Comment: (NOTE) The Xpert SA Assay (FDA approved for NASAL specimens in patients 57 years of age and older), is one component of a  comprehensive surveillance program. It is not intended to diagnose infection nor to guide or monitor treatment. Performed at [facility], [address] [place], [place] [address]      Radiology Studies: No results found.    Scheduled Meds:  aspirin EC  325 mg Oral QHS   atorvastatin  40 mg Oral QPM   carvedilol  25 mg Oral BID WC   Chlorhexidine Gluconate Cloth  6 each Topical Daily   cholecalciferol  1,000 Units Oral Daily   docusate sodium  100 mg Oral BID   enoxaparin (LOVENOX) injection  70 mg Subcutaneous Q24H   lisinopril  40 mg Oral QPM   mupirocin ointment  1 application  Nasal BID   polyethylene glycol  17 g Oral Daily   sodium chloride flush  3 mL Intravenous Q12H   spironolactone  25 mg Oral QPM   traMADol  50 mg Oral Q6H   Vitamin D (Ergocalciferol)  50,000 Units Oral Q7 days   Continuous Infusions:  methocarbamol (ROBAXIN) IV       LOS: 5 days   Joycelyn Das, MD [facility] Available via Epic secure chat 7am-7pm After these hours, please refer to coverage provider listed on [email or web address] 08/28/2021, 12:54 PM .

## 2021-08-28 NOTE — TOC Progression Note (Signed)
Transition of Care [facility]) - Progression Note    Patient Details  Name: Mathew Oliver MRN: [id number] Date of Birth: Mar 31, 1960  Transition of [facility] CM/SW Contact  Tom-Johnson, Hershal Coria, RN Phone Number: 08/28/2021, 12:16 PM  Clinical Narrative:     CIR awaiting Workers Comp approval to pursue CIR admit. TOC will continue to follow with needs.    Barriers to Discharge: Continued Medical Work up  Expected Discharge Plan and Services     Discharge Planning Services: CM Consult   Living arrangements for the past 2 months: Single Family Home                                       Social Determinants of Health (SDOH) Interventions    Readmission Risk Interventions     No data to display

## 2021-08-28 NOTE — Progress Notes (Signed)
Occupational Therapy Treatment Patient Details Name: Mathew Oliver MRN: [id number] DOB: September 08, 1960 Today's Date: 08/28/2021   History of present illness 61 yo male presenting 6/10 after sustaining a fall off of a ladder with pelvic fractures including left superior, inferior pubic rami, and left sacrum. Pt also with comminuted L distal humerus fx.  ORIF left UE 6/12. PMH includes: tobacco use, MI.   OT comments  Focus of session on L UE exercise and grooming EOB. Tolerated well, premedicated. Eager to get to rehab.    Recommendations for follow up therapy are one component of a multi-disciplinary discharge planning process, led by the attending physician.  Recommendations may be updated based on patient status, additional functional criteria and insurance authorization.    Follow Up Recommendations  Acute inpatient rehab (3hours/day)    Assistance Recommended at Discharge Intermittent Supervision/Assistance  Patient can return home with the following  Two people to help with walking and/or transfers;Two people to help with bathing/dressing/bathroom   Equipment Recommendations  BSC/3in1;Wheelchair (measurements OT);Wheelchair cushion (measurements OT)    Recommendations for Other Services      Precautions / Restrictions Precautions Precautions: Fall Required Braces or Orthoses: Sling Restrictions Weight Bearing Restrictions: Yes LUE Weight Bearing: Non weight bearing       Mobility Bed Mobility Overal bed mobility: Needs Assistance Bed Mobility: Supine to Sit, Sit to Supine     Supine to sit: Mod assist Sit to supine: Mod assist        Transfers                         Balance Overall balance assessment: Needs assistance Sitting-balance support: Single extremity supported, Feet supported Sitting balance-Leahy Scale: Fair                                     ADL either performed or assessed with clinical judgement   ADL Overall ADL's :  Needs assistance/impaired     Grooming: Minimal assistance;Sitting;Wash/dry hands;Wash/dry face;Oral care                                      Extremity/Trunk Assessment Upper Extremity Assessment LUE Deficits / Details: Completed AAROM L elbow and shoulder to pain tolerance, AROM forearm, wrist and hand, unhindered by pain. LUE Coordination: decreased gross motor            Vision       Perception     Praxis      Cognition Arousal/Alertness: Awake/alert Behavior During Therapy: WFL for tasks assessed/performed Overall Cognitive Status: Within Functional Limits for tasks assessed                                          Exercises      Shoulder Instructions       General Comments      Pertinent Vitals/ Pain       Pain Assessment Pain Assessment: Faces Faces Pain Scale: Hurts little more Pain Location: L elbow, hip Pain Descriptors / Indicators: Discomfort, Grimacing Pain Intervention(s): Premedicated before session, Repositioned  Home Living  Prior Functioning/Environment              Frequency  Min 2X/week        Progress Toward Goals  OT Goals(current goals can now be found in the care plan section)  Progress towards OT goals: Progressing toward goals  Acute Rehab OT Goals OT Goal Formulation: With patient Time For Goal Achievement: 09/07/21 Potential to Achieve Goals: Good  Plan Discharge plan remains appropriate    Co-evaluation                 AM-PAC OT "6 Clicks" Daily Activity     Outcome Measure   Help from another person eating meals?: A Little Help from another person taking care of personal grooming?: A Little Help from another person toileting, which includes using toliet, bedpan, or urinal?: A Lot Help from another person bathing (including washing, rinsing, drying)?: A Lot Help from another person to put on and taking off  regular upper body clothing?: A Lot Help from another person to put on and taking off regular lower body clothing?: A Lot 6 Click Score: 14    End of Session    OT Visit Diagnosis: Unsteadiness on feet (R26.81);Muscle weakness (generalized) (M62.81);Pain   Activity Tolerance Patient tolerated treatment well   Patient Left in bed;with call bell/phone within reach;with bed alarm set   Nurse Communication          Time: 5621-3086 OT Time Calculation (min): 18 min  Charges: OT General Charges $OT Visit: 1 Visit OT Treatments $Therapeutic Exercise: 8-22 mins  Berna Spare, OTR/L Acute Rehabilitation Services Office: [phone]   Evern Bio 08/28/2021, 3:57 PM

## 2021-08-28 NOTE — Progress Notes (Signed)
Inpatient Rehabilitation Admissions Coordinator   I await workers comp approval to pursue CIR admit.  Ottie Glazier, RN, MSN Rehab Admissions Coordinator [phone] 08/28/2021 8:24 AM

## 2021-08-29 NOTE — TOC Progression Note (Signed)
Transition of Care [facility]) - Progression Note    Patient Details  Name: ARMONDO CECH MRN: [id number] Date of Birth: 1961/03/10  Transition of Care [facility]) CM/SW Contact  Tom-Johnson, Hershal Coria, RN Phone Number: 08/29/2021, 4:16 PM  Clinical Narrative:     Forensic psychologist for CIR admit received. Bed will be available to  admit to on Sunday. TOC will continue to follow with needs.     Barriers to Discharge: Continued Medical Work up  Expected Discharge Plan and Services     Discharge Planning Services: CM Consult   Living arrangements for the past 2 months: Single Family Home                                       Social Determinants of Health (SDOH) Interventions    Readmission Risk Interventions     No data to display

## 2021-08-29 NOTE — Progress Notes (Signed)
Inpatient Rehabilitation Admissions Coordinator   I have workers comp approval to admit him to CIR. Bed available for his admit Sunday 6/18. 5N nurse can all rehab charge nurse at [phone] at 12 noon on Sunday to clarify what time bed will be available for admit . Rehab MD will be Dr Curlene Dolphin on Sunday. I met with patient at bedside and he is aware. Acute team made aware.  Danne Baxter, RN, MSN Rehab Admissions Coordinator [phone] 08/29/2021 4:12 PM

## 2021-08-29 NOTE — Progress Notes (Signed)
Physical Therapy Treatment Patient Details Name: Mathew Oliver MRN: [id number] DOB: 02-08-1961 Today's Date: 08/29/2021   History of Present Illness 61 yo male presenting 6/10 after sustaining a fall off of a ladder with pelvic fractures including left superior, inferior pubic rami, and left sacrum. Pt also with comminuted L distal humerus fx.  ORIF left UE 6/12. PMH includes: tobacco use, MI.    PT Comments    Pt progressed to performing sit <> stands from bed using RW (using as hemi-walker on R and will bring hemi-walker next treatment session). Pt still limited by pain and fatigue but he was able to initiate side steps at side of bed. Pt required left knee blocking when moving R LE for several steps but was able to do so without left knee blocking for a few steps. Progress as tolerated.  Recommendations for follow up therapy are one component of a multi-disciplinary discharge planning process, led by the attending physician.  Recommendations may be updated based on patient status, additional functional criteria and insurance authorization.  Follow Up Recommendations  Acute inpatient rehab (3hours/day)     Assistance Recommended at Discharge Frequent or constant Supervision/Assistance  Patient can return home with the following Two people to help with walking and/or transfers;Two people to help with bathing/dressing/bathroom;Assistance with cooking/housework;Assistance with feeding;Direct supervision/assist for medications management;Direct supervision/assist for financial management;Assist for transportation;Help with stairs or ramp for entrance   Equipment Recommendations  Wheelchair (measurements PT);Wheelchair cushion (measurements PT)    Recommendations for Other Services Rehab consult     Precautions / Restrictions Precautions Precautions: Fall Required Braces or Orthoses: Sling (L UE) Restrictions Weight Bearing Restrictions: Yes LUE Weight Bearing: Non weight bearing      Mobility  Bed Mobility Overal bed mobility: Needs Assistance Bed Mobility: Supine to Sit     Supine to sit: Min assist     General bed mobility comments: Cues provided for sequencing and HHA utilized on R as well as side rail on R.    Transfers Overall transfer level: Needs assistance Equipment used: Rolling walker (2 wheels) (serving as hemi-walker on right side) Transfers: Sit to/from Stand Sit to Stand: +2 physical assistance, +2 safety/equipment, Min assist           General transfer comment: Pt utilized RW with R UE to arise to standing position. Pt performed pre-gait weight shifts side to side and was able to progress to lifting R LE off floor several times although L knee was blocked.  Pt sat down at EOB after fatiguing but then again performed sit to stand for side steps at side of bed.    Ambulation/Gait Ambulation/Gait assistance: Mod assist, +2 physical assistance, +2 safety/equipment Gait Distance (Feet): 2 Feet Assistive device: Rolling walker (2 wheels) (RW utilized as hemi-walker on R side) Gait Pattern/deviations: Decreased stance time - right, Decreased stance time - left, Decreased weight shift to left, Antalgic       General Gait Details: Pt performed side steps while standing next to bed. Therapist assisted pt with moving RW (functioning as hemi-walker) and tech assisted pt by blocking his left knee when right LE was in swing phase.   Stairs             Wheelchair Mobility    Modified Rankin (Stroke Patients Only)       Balance Overall balance assessment: Needs assistance Sitting-balance support: Single extremity supported, Feet supported Sitting balance-Leahy Scale: Fair     Standing balance support: Single extremity supported, No upper  extremity supported   Standing balance comment: pt able to maintain good static standing balance with R UE on RW                            Cognition Arousal/Alertness:  Awake/alert Behavior During Therapy: WFL for tasks assessed/performed Overall Cognitive Status: Within Functional Limits for tasks assessed                                          Exercises General Exercises - Lower Extremity Long Arc Quad: AROM, Both, Seated (12 reps each side) Straight Leg Raises: Both, 10 reps, Supine Other Exercises Other Exercises: bridges x 8 supine in bed    General Comments        Pertinent Vitals/Pain Pain Assessment Negative Vocalization: occasional moan/groan, low speech, negative/disapproving quality Facial Expression: facial grimacing Body Language: tense, distressed pacing, fidgeting Consolability: distracted or reassured by voice/touch Pain Location: L elbow, hip    Home Living                          Prior Function            PT Goals (current goals can now be found in the care plan section) Acute Rehab PT Goals Patient Stated Goal: reduce pain Progress towards PT goals: Progressing toward goals    Frequency    Min 4X/week      PT Plan Current plan remains appropriate    Co-evaluation              AM-PAC PT "6 Clicks" Mobility   Outcome Measure  Help needed turning from your back to your side while in a flat bed without using bedrails?: A Little Help needed moving from lying on your back to sitting on the side of a flat bed without using bedrails?: A Little Help needed moving to and from a bed to a chair (including a wheelchair)?: A Lot Help needed standing up from a chair using your arms (e.g., wheelchair or bedside chair)?: A Lot Help needed to walk in hospital room?: Total Help needed climbing 3-5 steps with a railing? : Total 6 Click Score: 12    End of Session   Activity Tolerance: Patient limited by fatigue;Patient limited by pain;Patient tolerated treatment well Patient left: with call bell/phone within reach;in bed;with SCD's reapplied Nurse Communication: Mobility status  (maximove) PT Visit Diagnosis: Unsteadiness on feet (R26.81);Other abnormalities of gait and mobility (R26.89);Muscle weakness (generalized) (M62.81);Pain Pain - Right/Left: Right Pain - part of body: Arm (L leg/pelvis)     Time: 2703-5009 PT Time Calculation (min) (ACUTE ONLY): 33 min  Charges:  $Therapeutic Exercise: 8-22 mins $Therapeutic Activity: 8-22 mins                     Tana Coast, PT    [employer] 08/29/2021, 4:04 PM

## 2021-08-29 NOTE — H&P (Incomplete)
Physical Medicine and Rehabilitation Admission H&P    Chief Complaint  Patient presents with   Polytrauma with functional deficits    HPI: Mathew Oliver is a 61 year old male with history of CAD, systolic heart failure, HTN, hyperlipidemia; who was admitted on 08/23/2021 after tripping and falling from a 25 foot ladder at work.  He was noted to be mildly tachypneic at admission and had leukocytosis.  He was placed on oxygen per nasal cannula and work-up revealed left superior and inferior pubic rami and left sacrum fractures, complex left distal humerus fracture which was placed in long-arm splint. Dr. Doreatha Martin was complete consulted due to complexity of injuries and recommended WBAT BLE and surgical fixation due to significance of injury.  He was taken to the OR on 08/25/2020 for ORIF left distal humerus with primary repair of partial triceps tendon tear, ulna osteotomy as well as I&D of left open distal humerus fracture.   He is to be NWB LUE with unrestricted range of motion at left elbow as well as Lovenox for DVT prophylaxis with transition to DOAC at discharge.  He was treated with IV fluids for hydration however developed acute hypoxic respiratory failure requiring IV diuresis respiratory status has improved and he was weaned off oxygen.  Reactive leukocytosis has resolved and pain control slowly improving.  PT OT has been working with patient and he continued to be limited by fatigue, weakness, weightbearing restrictions and difficulty weightbearing through BLE.  Patient has had a decline in functional status and CIR was recommended for follow-up therapy   ROS   Past Medical History:  Diagnosis Date   MI, acute, non ST segment elevation ([place])     Past Surgical History:  Procedure Laterality Date   CARDIAC CATHETERIZATION     ORIF HUMERUS FRACTURE Left 08/25/2021   Procedure: OPEN REDUCTION INTERNAL FIXATION (ORIF) DISTAL HUMERUS FRACTURE;  Surgeon: Shona Needles, MD;   Location: [facility];  Service: Orthopedics;  Laterality: Left;    History reviewed. No pertinent family history.   Social History:  reports that he has been smoking cigarettes. He does not have any smokeless tobacco history on file. He reports current alcohol use. He reports that he does not use drugs.   Allergies  Allergen Reactions   Niacin And Related     Medications Prior to Admission  Medication Sig Dispense Refill   aspirin EC 325 MG tablet Take 325 mg by mouth at bedtime.     atorvastatin (LIPITOR) 40 MG tablet Take 40 mg by mouth every evening.     carvedilol (COREG) 25 MG tablet Take 25 mg by mouth 2 (two) times daily with a meal.     lisinopril (ZESTRIL) 40 MG tablet Take 40 mg by mouth every evening.     multivitamin (ONE-A-DAY MEN'S) TABS tablet Take 1 tablet by mouth daily.     naproxen sodium (ALEVE) 220 MG tablet Take 440 mg by mouth daily as needed (pain/headache).     spironolactone (ALDACTONE) 25 MG tablet Take 25 mg by mouth every evening.        Home: Home Living Family/patient expects to be discharged to:: Private residence Living Arrangements: Spouse/significant other Available Help at Discharge: Family, Available 24 hours/day Type of Home: Mobile home Home Access: Stairs to enter [employer] of Steps: 1-2 Entrance Stairs-Rails: None Home Layout: One level Bathroom Shower/Tub: Chiropodist: Handicapped height Bathroom Accessibility: Yes Home Equipment: Grab bars - tub/shower Additional Comments: wife can (  A)  Lives With: Spouse   Functional History: Prior Function Prior Level of Function : Independent/Modified Independent, Working/employed, Driving Mobility Comments: pt works Engineer, mining, no falls, no mobility issues  Functional Status:  Mobility: Bed Mobility Overal bed mobility: Needs Assistance Bed Mobility: Supine to Sit Supine to sit: Min assist Sit to supine: Mod assist General bed mobility  comments: Cues provided for sequencing and HHA utilized on R as well as side rail on R. Transfers Overall transfer level: Needs assistance Equipment used: Rolling walker (2 wheels) (serving as hemi-walker on right side) Transfers: Sit to/from Stand Sit to Stand: +2 physical assistance, +2 safety/equipment, Min assist Bed to/from chair/wheelchair/BSC transfer type:: Via Lift equipment  Lateral/Scoot Transfers: Mod assist, From elevated surface Transfer via Lift Equipment: Stedy General transfer comment: Pt utilized RW with R UE to arise to standing position. Pt performed pre-gait weight shifts side to side and was able to progress to lifting R LE off floor several times although L knee was blocked.  Pt sat down at EOB after fatiguing but then again performed sit to stand for side steps at side of bed. Ambulation/Gait Ambulation/Gait assistance: Mod assist, +2 physical assistance, +2 safety/equipment Gait Distance (Feet): 2 Feet Assistive device: Rolling walker (2 wheels) (RW utilized as hemi-walker on R side) Gait Pattern/deviations: Decreased stance time - right, Decreased stance time - left, Decreased weight shift to left, Antalgic General Gait Details: Pt performed side steps while standing next to bed. Therapist assisted pt with moving RW (functioning as hemi-walker) and tech assisted pt by blocking his left knee when right LE was in swing phase.    ADL: ADL Overall ADL's : Needs assistance/impaired Eating/Feeding: Bed level, Minimal assistance Grooming: Minimal assistance, Sitting, Wash/dry hands, Wash/dry face, Oral care Upper Body Bathing: Maximal assistance Lower Body Bathing: Total assistance Upper Body Dressing : Maximal assistance, Bed level Lower Body Dressing: Total assistance General ADL Comments: pt return back to bed just prior to arrival. pt reports sitting up for over one hour and half. pt feeling hot on arrival but agreeable to starting ROM of L UE. Pt provided 4 new  batteries for [facility] fans in the room  Cognition: Cognition Overall Cognitive Status: Within Functional Limits for tasks assessed Orientation Level: Oriented X4 Cognition Arousal/Alertness: Awake/alert Behavior During Therapy: WFL for tasks assessed/performed Overall Cognitive Status: Within Functional Limits for tasks assessed   Blood pressure 112/74, pulse (!) 58, temperature 97.8 F (36.6 C), temperature source Oral, resp. rate 18, height 6' (1.829 m), weight (!) 147.2 kg, SpO2 100 %. Physical Exam  No results found for this or any previous visit (from the past 48 hour(s)). No results found.    Blood pressure 112/74, pulse (!) 58, temperature 97.8 F (36.6 C), temperature source Oral, resp. rate 18, height 6' (1.829 m), weight (!) 147.2 kg, SpO2 100 %.  Medical Problem List and Plan: 1. Functional deficits secondary to ***  -patient may *** shower  -ELOS/Goals: *** 2.  Antithrombotics: -DVT/anticoagulation:  Pharmaceutical: transition to Eliquis 2.5 mg BID X 30 day  -antiplatelet therapy: ASA 3. Pain Management: Will add Oxycontin for more consistent pain relief and d/c IV dilaudid  --continue oxycodone prn.   --continue robaxin 4. Mood/Sleep: LCSW to follow for evaluation and support.   -antipsychotic agents: N/A 5. Neuropsych/cognition: This patient *** capable of making decisions on *** own behalf. 6. Skin/Wound Care: Routine pressure relief measures. Monitor wound for healing.  7. Fluids/Electrolytes/Nutrition: Monitor I/O. Check CMET on Monday 8. Left  distal humerus Fx s/p ORIF: NWB LUE. Follow up X rays in 2 weeks --Sling for comfort.  9. CAD w/systolic CHF: Monitor for signs of overload. Check wts daily.  --Monitor for symptoms with increase in activity.   --continue ASA, Coreg, Lipitor, aldactone and Zestril 10. New diagnosis T2DM: Hgb A1C- 7.5.  --WIll start monitoring BS ac/hs and use SSI for elevated BS  --Will likely need oral agent for better  control and to promote wound healing.  11. Sacral FX: WBAT 12. Acute blood loss anemia: Monitor for signs of bleeding.   --recheck CBC on Monday.  13. Morbid obesity-BMI 44: Encourage appropriate diet and activity for wt loss to promote mobility and overall  health.  14. Vitamin D deficiency:  On Ergocalciferol for supplement.     ***  Jacquelynn Cree, PA-C 08/30/2021

## 2021-08-29 NOTE — Progress Notes (Signed)
PROGRESS NOTE    Mathew Oliver  GNF:[id number] DOB: 1960-06-24 DOA: 08/23/2021 PCP: Oneita Hurt, No    Brief Narrative:   Mathew Oliver is a 61 y.o. male with past medical history significant for essential hypertension, hyperlipidemia, coronary disease, chronic systolic congestive heart failure  obesity presented to the hospital with left elbow pain following a fall after losing his balance.  He hit his left elbow and hip on fall.  Patient was brought into the hospital on a splint.  In the ED, patient was seen as a level 2 trauma.  Vitals with mild tachypnea with pulse ox of 97% 2 L nasal cannula.  WBC was mildly elevated at 12.6 with hemoglobin of 14.7.   Lactic acid 2.0.  COVID-19 PCR negative.  Influenza A/B PCR negative.  Urinalysis was negative for infection. Pelvis x-ray with left pubic bone, superior pubic ramus and inferior pubic ramus fractures.  Left humerus x-ray with comminuted, displaced distal left humeral fracture.  CT left upper extremity with comminuted intra-articular fracture/dislocation at the elbow with severely displaced distal humerus fracture, transverse supracondylar component with splitting of the epicondyles, partial reticulation of the radius with a capitulum, nondisplaced radial head fracture, large joint effusion and probable hematoma, extensive soft tissue swelling and soft tissue gas suggesting open fracture.  CT head/C-spine with no acute intracranial abnormality, no acute/traumatic cervical spine pathology.  CT chest abdomen/pelvis with pelvic fractures including left superior/inferior pubic rami and left sacrum, otherwise no evidence of significant trauma or acute findings in the chest/abdomen/pelvis.  Orthopedics was consulted and patient was admitted hospital for further evaluation and treatment.   During hospitalization, patient underwent ORIF of the left humerus on 08/25/2021.  Currently, awaiting for CIR placement  Assessment & Plan:  Principal Problem:   Open  fracture of left distal humerus Active Problems:   Accidental fall from ladder   Multiple closed stable fractures of pubic ramus (HCC)   Sacral fracture (HCC)   Lactic acidosis   Leukocytosis   Heart failure with reduced ejection fraction (HCC)   Essential hypertension   CAD S/P percutaneous coronary angioplasty   Obesity, Class III, BMI 40-49.9 (morbid obesity) (HCC)   Vitamin D deficiency   Left open comminuted distal humerus fracture Status post mechanical fall.  Patient underwent open reduction internal fixation of the left distal femur fracture, primary repair of partial triceps tendon tear, ulnar osteotomy and irrigation and debridement of the left open distal humerus fracture on 08/25/2021 by orthopedics.  Orthopedics recommended nonweight bearing of the affected extremity.  Continue supportive care and analgesia.  Sacrum, pubic rami fracture Pelvis x-ray with left pubic bone fracture, superior pubic rami fracture, inferior pubic ramus fracture orthopedics has seen the patient and recommended weightbearing as tolerated.  Awaiting CIR at this time.  Acute hypoxic respiratory failure Resolved at this time.  History of  chronic congestive heart failure.  2D echocardiogram with systolic dysfunction.  Patient received IV Lasix x2 during hospitalization.  Leukocytosis/Lactic acidosis: Resolved Thought to be likely reactive.  Latest WBC at 10.1.  Chronic systolic congestive heart failure,   2D echocardiogram done on 08/24/2021 showed LV ejection fraction of 40 to 45% with grade 2 diastolic dysfunction.  No significant changes compared to prior.  Currently on Coreg, spironolactone and lisinopril.  Received IV Lasix x2 during hospitalization.  Currently negative balance.  Constipation.  We will continue with MiraLAX and Colace   Essential hypertension Continue Coreg lisinopril, spironolactone.    CAD s/p PCI   Continue aspirin,  statins and beta-blockers.  Follow-up with cardiology as  outpatient.    Hyperlipidemia: Continue Lipitor.  Morbid obesity Body mass index is 44.01 kg/m.  Would benefit from weight loss as outpatient.  Vitamin D deficiency.  Continue 50,000 unit vitamin D weekly.  DVT prophylaxis: Place and maintain sequential compression device Start: 08/26/21 0824 SCDs Start: 08/25/21 1552  Lovenox subcu    Code Status: Full Code  Family Communication:  Spoke with the patient at bedside   Disposition Plan: CIR Level of care: Telemetry Medical  Status is: Inpatient  Remains inpatient appropriate because: Plan for CIR placement status post ORIF.  Medically stable for disposition.  Consultants:  Orthopedics, Dr. Jena Gauss  Procedures:  Open reduction internal fixation of the left distal femur fracture, primary repair of partial triceps tendon tear, ulnar osteotomy and irrigation and debridement of the left open distal humerus fracture on 08/25/2021 by orthopedics.   Antimicrobials:  Cefazolin for surgical prophylaxis  Subjective: Today, patient was seen and examined at bedside.  Denies interval complaints.  No nausea vomiting fever chills.    Objective: Vitals:   08/28/21 2053 08/29/21 0752 08/29/21 0755 08/29/21 0840  BP: 100/65 130/77    Pulse: 70 65 70   Resp: 18  18 18   Temp: 97.8 F (36.6 C) 97.8 F (36.6 C)    TempSrc: Oral Oral    SpO2: 99% 94% 92%   Weight:      Height:        Intake/Output Summary (Last 24 hours) at 08/29/2021 1305 Last data filed at 08/29/2021 0900 Gross per 24 hour  Intake --  Output 600 ml  Net -600 ml    Filed Weights   08/23/21 0100 08/24/21 0845 08/26/21 0500  Weight: 136.1 kg (!) 139.7 kg (!) 147.2 kg   Body mass index is 44.01 kg/m.   Examination: General: Morbidly obese, not in obvious distress HENT:   No scleral pallor or icterus noted. Oral mucosa is moist.  Chest:   Diminished breath sounds bilaterally. No crackles or wheezes.  CVS: S1 &S2 heard. No murmur.  Regular rate and  rhythm. Abdomen: Soft, nontender, nondistended.  Bowel sounds are heard.   Extremities: No cyanosis, clubbing or edema.  Peripheral pulses are palpable. Left Upper extremity on splint Psych: Alert, awake and oriented, normal mood CNS:  No cranial nerve deficits.  Moves all extremities Skin: Warm and dry.  No rashes noted.   Data Reviewed: I have personally reviewed following labs and imaging studies  CBC: Recent Labs  Lab 08/24/21 0239 08/24/21 2209 08/25/21 0058 08/26/21 0126 08/27/21 0151  WBC 12.5* 12.5* 10.9* 13.5* 10.1  HGB 13.2 12.7* 12.2* 12.9* 11.8*  HCT 39.7 36.6* 36.7* 38.5* 34.4*  MCV 94.3 94.1 94.3 93.9 93.5  PLT 170 169 164 198 214    Basic Metabolic Panel: Recent Labs  Lab 08/24/21 0239 08/24/21 2209 08/25/21 0058 08/26/21 0126 08/27/21 0151  NA 136 136 137 136 136  K 3.9 3.9 3.7 4.2 3.9  CL 104 100 102 102 99  CO2 26 28 28 27  32  GLUCOSE 183* 164* 177* 186* 153*  BUN 15 20 20 14 17   CREATININE 0.79 1.01 0.96 0.64 0.68  CALCIUM 8.2* 8.3* 8.4* 8.3* 8.3*  MG  --   --  2.2 2.2  --     GFR: Estimated Creatinine Clearance: 144.6 mL/min (by C-G formula based on SCr of 0.68 mg/dL). Liver Function Tests: Recent Labs  Lab 08/23/21 0114 08/24/21 2209  AST 40 18  ALT 33 20  ALKPHOS 49 44  BILITOT 0.6 0.9  PROT 5.8* 5.5*  ALBUMIN 3.3* 2.8*    No results for input(s): "LIPASE", "AMYLASE" in the last 168 hours. No results for input(s): "AMMONIA" in the last 168 hours. Coagulation Profile: Recent Labs  Lab 08/23/21 0114  INR 1.0    Cardiac Enzymes: Recent Labs  Lab 08/23/21 1714  CKTOTAL 252    BNP (last 3 results) No results for input(s): "PROBNP" in the last 8760 hours. HbA1C: No results for input(s): "HGBA1C" in the last 72 hours.  CBG: No results for input(s): "GLUCAP" in the last 168 hours. Lipid Profile: No results for input(s): "CHOL", "HDL", "LDLCALC", "TRIG", "CHOLHDL", "LDLDIRECT" in the last 72 hours. Thyroid Function  Tests: No results for input(s): "TSH", "T4TOTAL", "FREET4", "T3FREE", "THYROIDAB" in the last 72 hours. Anemia Panel: No results for input(s): "VITAMINB12", "FOLATE", "FERRITIN", "TIBC", "IRON", "RETICCTPCT" in the last 72 hours. Sepsis Labs: Recent Labs  Lab 08/23/21 0114 08/23/21 1714 08/24/21 2209 08/25/21 0058  PROCALCITON  --   --  <0.10  --   LATICACIDVEN 2.0* 1.6 1.2 0.9     Recent Results (from the past 240 hour(s))  Resp Panel by RT-PCR (Flu A&B, Covid) Anterior Nasal Swab     Status: None   Collection Time: 08/23/21  1:30 AM   Specimen: Anterior Nasal Swab  Result Value Ref Range Status   SARS Coronavirus 2 by RT PCR NEGATIVE NEGATIVE Final    Comment: (NOTE) SARS-CoV-2 target nucleic acids are NOT DETECTED.  The SARS-CoV-2 RNA is generally detectable in upper respiratory specimens during the acute phase of infection. The lowest concentration of SARS-CoV-2 viral copies this assay can detect is 138 copies/mL. A negative result does not preclude SARS-Cov-2 infection and should not be used as the sole basis for treatment or other patient management decisions. A negative result may occur with  improper specimen collection/handling, submission of specimen other than nasopharyngeal swab, presence of viral mutation(s) within the areas targeted by this assay, and inadequate number of viral copies(<138 copies/mL). A negative result must be combined with clinical observations, patient history, and epidemiological information. The expected result is Negative.  Fact Sheet for Patients:  EntrepreneurPulse.com.au  Fact Sheet for Healthcare Providers:  IncredibleEmployment.be  This test is no t yet approved or cleared by the [place] FDA and  has been authorized for detection and/or diagnosis of SARS-CoV-2 by FDA under an Emergency Use Authorization (EUA). This EUA will remain  in effect (meaning this test can be used) for the duration  of the COVID-19 declaration under Section 564(b)(1) of the Act, 21 U.S.C.section 360bbb-3(b)(1), unless the authorization is terminated  or revoked sooner.       Influenza A by PCR NEGATIVE NEGATIVE Final   Influenza B by PCR NEGATIVE NEGATIVE Final    Comment: (NOTE) The Xpert Xpress SARS-CoV-2/FLU/RSV plus assay is intended as an aid in the diagnosis of influenza from Nasopharyngeal swab specimens and should not be used as a sole basis for treatment. Nasal washings and aspirates are unacceptable for Xpert Xpress SARS-CoV-2/FLU/RSV testing.  Fact Sheet for Patients: EntrepreneurPulse.com.au  Fact Sheet for Healthcare Providers: IncredibleEmployment.be  This test is not yet approved or cleared by the [place] FDA and has been authorized for detection and/or diagnosis of SARS-CoV-2 by FDA under an Emergency Use Authorization (EUA). This EUA will remain in effect (meaning this test can be used) for the duration of the COVID-19 declaration under Section 564(b)(1) of the Act, 21  U.S.C. section 360bbb-3(b)(1), unless the authorization is terminated or revoked.  Performed at [facility], [place] [address], [place], [place] [address]   Surgical pcr screen     Status: Abnormal   Collection Time: 08/24/21 10:54 PM   Specimen: Nasal Mucosa; Nasal Swab  Result Value Ref Range Status   MRSA, PCR NEGATIVE NEGATIVE Final   Staphylococcus aureus POSITIVE (A) NEGATIVE Final    Comment: (NOTE) The Xpert SA Assay (FDA approved for NASAL specimens in patients 30 years of age and older), is one component of a comprehensive surveillance program. It is not intended to diagnose infection nor to guide or monitor treatment. Performed at [facility], [place] [address], [place], [place] [address]      Radiology Studies: No results found.    Scheduled Meds:  aspirin EC  325 mg Oral QHS   atorvastatin  40 mg Oral QPM   carvedilol  25 mg Oral  BID WC   Chlorhexidine Gluconate Cloth  6 each Topical Daily   cholecalciferol  1,000 Units Oral Daily   docusate sodium  100 mg Oral BID   enoxaparin (LOVENOX) injection  70 mg Subcutaneous Q24H   lisinopril  40 mg Oral QPM   mupirocin ointment  1 application  Nasal BID   polyethylene glycol  17 g Oral Daily   sodium chloride flush  3 mL Intravenous Q12H   spironolactone  25 mg Oral QPM   traMADol  50 mg Oral Q6H   Vitamin D (Ergocalciferol)  50,000 Units Oral Q7 days   Continuous Infusions:  methocarbamol (ROBAXIN) IV       LOS: 6 days   Flora Lipps, MD [facility] Available via Epic secure chat 7am-7pm After these hours, please refer to coverage provider listed on [email or web address] 08/29/2021, 1:05 PM .

## 2021-08-30 MED ORDER — LORAZEPAM 2 MG/ML IJ SOLN: 0.5000 mg | Freq: Once | INTRAMUSCULAR | Status: DC | PRN

## 2021-08-30 NOTE — Progress Notes (Signed)
Occupational Therapy Treatment Patient Details Name: Mathew Oliver MRN: [id number] DOB: 1960/09/09 Today's Date: 08/30/2021   History of present illness 61 yo male presenting 6/10 after sustaining a fall off of a ladder with pelvic fractures including left superior, inferior pubic rami, and left sacrum. Pt also with comminuted L distal humerus fx.  ORIF left UE 6/12. PMH includes: tobacco use, MI.   OT comments  Pt progressing towards goals this session, focused on UB dressing and LUE exercise while seated EOB. Pt min guard-mod A for bed mobility, min A for sit to stand transfer x2 at bedside with use of SPC. Pt able to stand at bedside and take steps in place for ~2 mins for bed linen change. Pt presenting with impairments listed below, will follow acutely. Continue to recommend AIR at d/c.   Recommendations for follow up therapy are one component of a multi-disciplinary discharge planning process, led by the attending physician.  Recommendations may be updated based on patient status, additional functional criteria and insurance authorization.    Follow Up Recommendations  Acute inpatient rehab (3hours/day)    Assistance Recommended at Discharge Intermittent Supervision/Assistance  Patient can return home with the following  Two people to help with walking and/or transfers;Two people to help with bathing/dressing/bathroom   Equipment Recommendations  BSC/3in1;Wheelchair (measurements OT);Wheelchair cushion (measurements OT)    Recommendations for Other Services Rehab consult    Precautions / Restrictions Precautions Precautions: Fall Required Braces or Orthoses: Sling (LUE) Restrictions Weight Bearing Restrictions: Yes LUE Weight Bearing: Non weight bearing       Mobility Bed Mobility Overal bed mobility: Needs Assistance Bed Mobility: Supine to Sit, Sit to Supine     Supine to sit: Min guard Sit to supine: Mod assist   General bed mobility comments: to bring BLE's  into bed    Transfers Overall transfer level: Needs assistance Equipment used: Straight cane Transfers: Sit to/from Stand Sit to Stand: Min assist           General transfer comment: stood x2 at EOB with SPC, unable to obtain hemi-walker, pt able to take steps in place     Balance Overall balance assessment: Needs assistance Sitting-balance support: Single extremity supported, Feet supported Sitting balance-Leahy Scale: Fair Sitting balance - Comments: pt able to maintain static sitting balance at midline with single arm support   Standing balance support: Single extremity supported, No upper extremity supported Standing balance-Leahy Scale: Poor Standing balance comment: reliant on external support                           ADL either performed or assessed with clinical judgement   ADL Overall ADL's : Needs assistance/impaired                 Upper Body Dressing : Moderate assistance Upper Body Dressing Details (indicate cue type and reason): to don sling and clean gown                        Extremity/Trunk Assessment Upper Extremity Assessment Upper Extremity Assessment: LUE deficits/detail LUE Deficits / Details: 75% elbow AROM, wrist and hand ROM WFL LUE Coordination: decreased gross motor   Lower Extremity Assessment Lower Extremity Assessment: Defer to PT evaluation        Vision   Vision Assessment?: No apparent visual deficits   Perception Perception Perception: Not tested   Praxis Praxis Praxis: Not tested    Cognition  Arousal/Alertness: Awake/alert Behavior During Therapy: WFL for tasks assessed/performed Overall Cognitive Status: Within Functional Limits for tasks assessed                                          Exercises Exercises: General Upper Extremity General Exercises - Upper Extremity Elbow Flexion: AROM, 10 reps, Left, Seated Elbow Extension: AROM, Left, 10 reps, Seated Wrist Flexion:  AROM, Left, 10 reps, Seated Wrist Extension: AROM, Left, 10 reps, Seated Digit Composite Flexion: AROM, Left, 10 reps, Seated Composite Extension: AROM, Left, 10 reps, Seated    Shoulder Instructions       General Comments VSS On RA    Pertinent Vitals/ Pain       Pain Assessment Pain Assessment: Faces Pain Score: 8  Pain Location: L hip Pain Descriptors / Indicators: Discomfort, Grimacing Pain Intervention(s): Limited activity within patient's tolerance, Monitored during session, Premedicated before session, Repositioned  Home Living                                          Prior Functioning/Environment              Frequency  Min 2X/week        Progress Toward Goals  OT Goals(current goals can now be found in the care plan section)  Progress towards OT goals: Progressing toward goals  Acute Rehab OT Goals Patient Stated Goal: to get to rehab OT Goal Formulation: With patient Time For Goal Achievement: 09/07/21 Potential to Achieve Goals: Good ADL Goals Pt Will Perform Grooming: with supervision;sitting Pt Will Transfer to Toilet: with +2 assist;with mod assist;stand pivot transfer;bedside commode Additional ADL Goal #1: pt will complete bed mobility total +2 min (A) as precursor to adls.  Plan Discharge plan remains appropriate    Co-evaluation                 AM-PAC OT "6 Clicks" Daily Activity     Outcome Measure   Help from another person eating meals?: A Little Help from another person taking care of personal grooming?: A Little Help from another person toileting, which includes using toliet, bedpan, or urinal?: A Lot Help from another person bathing (including washing, rinsing, drying)?: A Lot Help from another person to put on and taking off regular upper body clothing?: A Lot Help from another person to put on and taking off regular lower body clothing?: A Lot 6 Click Score: 14    End of Session Equipment Utilized  During Treatment: Gait belt;Other (comment) (cane)  OT Visit Diagnosis: Unsteadiness on feet (R26.81);Muscle weakness (generalized) (M62.81);Pain Pain - Right/Left: Left Pain - part of body: Hip   Activity Tolerance Patient tolerated treatment well   Patient Left in bed;with call bell/phone within reach;with bed alarm set   Nurse Communication Mobility status;Precautions;Weight bearing status (skin breakdown noted on pt's bottom)        Time: 1431-1501 OT Time Calculation (min): 30 min  Charges: OT General Charges $OT Visit: 1 Visit OT Treatments $Self Care/Home Management : 8-22 mins $Therapeutic Activity: 8-22 mins  Alfonzo Beers, OTD, OTR/L Acute Rehab [phone]   Mayer Masker 08/30/2021, 3:15 PM

## 2021-08-30 NOTE — Plan of Care (Signed)

## 2021-08-30 NOTE — Progress Notes (Signed)
Pt reported awakening from sleep feeling anxious, jittery and restless. Asked RN to request "something to take the edge off" and assist with sleeping, secure chat sent to on call Hospitalist, [facility], DO to request medication.

## 2021-08-30 NOTE — Progress Notes (Signed)
PROGRESS NOTE    CADENCE HASLAM  XTG:[id number] DOB: Jan 25, 1961 DOA: 08/23/2021 PCP: Oneita Hurt, No    Brief Narrative:   MAGUIRE SIME is a 61 y.o. male with past medical history significant for essential hypertension, hyperlipidemia, coronary disease, chronic systolic congestive heart failure  obesity presented to the hospital with left elbow pain following a fall after losing his balance.  He hit his left elbow and hip on fall.  Patient was brought into the hospital on a splint.  In the ED, patient was seen as a level 2 trauma.  Vitals with mild tachypnea with pulse ox of 97% 2 L nasal cannula.  WBC was mildly elevated at 12.6 with hemoglobin of 14.7.   Lactic acid 2.0.  COVID-19 PCR negative.  Influenza A/B PCR negative.  Urinalysis was negative for infection. Pelvis x-ray with left pubic bone, superior pubic ramus and inferior pubic ramus fractures.  Left humerus x-ray with comminuted, displaced distal left humeral fracture.  CT left upper extremity with comminuted intra-articular fracture/dislocation at the elbow with severely displaced distal humerus fracture, transverse supracondylar component with splitting of the epicondyles, partial reticulation of the radius with a capitulum, nondisplaced radial head fracture, large joint effusion and probable hematoma, extensive soft tissue swelling and soft tissue gas suggesting open fracture.  CT head/C-spine with no acute intracranial abnormality, no acute/traumatic cervical spine pathology.  CT chest abdomen/pelvis with pelvic fractures including left superior/inferior pubic rami and left sacrum, otherwise no evidence of significant trauma or acute findings in the chest/abdomen/pelvis.  Orthopedics was consulted and patient was admitted hospital for further evaluation and treatment.   During hospitalization, patient underwent ORIF of the left humerus on 08/25/2021.  Currently, awaiting for CIR placement  Assessment & Plan:  Principal Problem:   Open  fracture of left distal humerus Active Problems:   Accidental fall from ladder   Multiple closed stable fractures of pubic ramus (HCC)   Sacral fracture (HCC)   Lactic acidosis   Leukocytosis   Heart failure with reduced ejection fraction (HCC)   Essential hypertension   CAD S/P percutaneous coronary angioplasty   Obesity, Class III, BMI 40-49.9 (morbid obesity) (HCC)   Vitamin D deficiency   Left open comminuted distal humerus fracture Status post mechanical fall.  Patient underwent open reduction internal fixation of the left distal femur fracture, primary repair of partial triceps tendon tear, ulnar osteotomy and irrigation and debridement of the left open distal humerus fracture on 08/25/2021 by orthopedics.  Orthopedics recommended nonweight bearing of the affected extremity.  Continue supportive care and analgesia.  Sacrum, pubic rami fracture Pelvis x-ray with left pubic bone fracture, superior pubic rami fracture, inferior pubic ramus fracture orthopedics has seen the patient and recommended weightbearing as tolerated.  Awaiting CIR at this time.  Acute hypoxic respiratory failure Resolved at this time.  History of  chronic congestive heart failure.  2D echocardiogram with systolic dysfunction.  Patient received IV Lasix x2 during hospitalization.  Leukocytosis/Lactic acidosis: Resolved Thought to be likely reactive.  Latest WBC at 10.1.  Chronic systolic congestive heart failure,   2D echocardiogram done on 08/24/2021 showed LV ejection fraction of 40 to 45% with grade 2 diastolic dysfunction.  No significant changes compared to prior.  Currently on Coreg, spironolactone and lisinopril.  Received IV Lasix x2 during hospitalization.  Patient is negative balance for 2188 ml.  Constipation.  We will continue with MiraLAX and Colace   Essential hypertension Continue Coreg lisinopril, spironolactone.  Blood pressure stable at this  time.  CAD s/p PCI   Continue aspirin, statins and  beta-blockers.  Follow-up with cardiology as outpatient.    Hyperlipidemia: Continue Lipitor.  Morbid obesity Body mass index is 39.95 kg/m.  Would benefit from weight loss as outpatient.  Vitamin D deficiency.  Continue 50,000 unit vitamin D weekly.  DVT prophylaxis: Place and maintain sequential compression device Start: 08/26/21 0824 SCDs Start: 08/25/21 1552  Lovenox subcu    Code Status: Full Code  Family Communication:  Spoke with the patient at bedside   Disposition Plan: CIR likely 08/31/2021 Level of care: Telemetry Medical  Status is: Inpatient  Remains inpatient appropriate because: Plan for CIR placement status post ORIF.  Medically stable for disposition.  Consultants:  Orthopedics, Dr. Jena Gauss  Procedures:  Open reduction internal fixation of the left distal femur fracture, primary repair of partial triceps tendon tear, ulnar osteotomy and irrigation and debridement of the left open distal humerus fracture on 08/25/2021 by orthopedics.   Antimicrobials:  Cefazolin for surgical prophylaxis  Subjective: Today, patient was seen and examined at bedside.  Denies overt pain.  Feels little wert in the morning but was not sure what it was.    Objective: Vitals:   08/29/21 1900 08/30/21 0522 08/30/21 0545 08/30/21 0743  BP: 112/74 (!) 154/81  127/76  Pulse: (!) 58 (!) 58  72  Resp: 18 18    Temp: 97.8 F (36.6 C) 97.8 F (36.6 C)  98.1 F (36.7 C)  TempSrc: Oral   Oral  SpO2: 100% 91%  96%  Weight:   133.6 kg   Height:        Intake/Output Summary (Last 24 hours) at 08/30/2021 1014 Last data filed at 08/30/2021 0345 Gross per 24 hour  Intake 123 ml  Output 1600 ml  Net -1477 ml    Filed Weights   08/24/21 0845 08/26/21 0500 08/30/21 0545  Weight: (!) 139.7 kg (!) 147.2 kg 133.6 kg   Body mass index is 39.95 kg/m.   Examination:  General: Morbidly obese built, not in obvious distress HENT:   No scleral pallor or icterus noted. Oral mucosa is  moist.  Chest:   Diminished breath sounds bilaterally. No crackles or wheezes.  CVS: S1 &S2 heard. No murmur.  Regular rate and rhythm. Abdomen: Soft, nontender, nondistended.  Bowel sounds are heard.   Extremities: No cyanosis, clubbing or edema.  Peripheral pulses are palpable.  Left upper extremity in the splint Psych: Alert, awake and oriented, normal mood CNS:  No cranial nerve deficits.  Moves all extremities. Skin: Warm and dry.  No rashes noted.   Data Reviewed: I have personally reviewed following labs and imaging studies  CBC: Recent Labs  Lab 08/24/21 0239 08/24/21 2209 08/25/21 0058 08/26/21 0126 08/27/21 0151  WBC 12.5* 12.5* 10.9* 13.5* 10.1  HGB 13.2 12.7* 12.2* 12.9* 11.8*  HCT 39.7 36.6* 36.7* 38.5* 34.4*  MCV 94.3 94.1 94.3 93.9 93.5  PLT 170 169 164 198 214    Basic Metabolic Panel: Recent Labs  Lab 08/24/21 0239 08/24/21 2209 08/25/21 0058 08/26/21 0126 08/27/21 0151  NA 136 136 137 136 136  K 3.9 3.9 3.7 4.2 3.9  CL 104 100 102 102 99  CO2 26 28 28 27  32  GLUCOSE 183* 164* 177* 186* 153*  BUN 15 20 20 14 17   CREATININE 0.79 1.01 0.96 0.64 0.68  CALCIUM 8.2* 8.3* 8.4* 8.3* 8.3*  MG  --   --  2.2 2.2  --  GFR: Estimated Creatinine Clearance: 137.2 mL/min (by C-G formula based on SCr of 0.68 mg/dL). Liver Function Tests: Recent Labs  Lab 08/24/21 2209  AST 18  ALT 20  ALKPHOS 44  BILITOT 0.9  PROT 5.5*  ALBUMIN 2.8*    No results for input(s): "LIPASE", "AMYLASE" in the last 168 hours. No results for input(s): "AMMONIA" in the last 168 hours. Coagulation Profile: No results for input(s): "INR", "PROTIME" in the last 168 hours.  Cardiac Enzymes: Recent Labs  Lab 08/23/21 1714  CKTOTAL 252    BNP (last 3 results) No results for input(s): "PROBNP" in the last 8760 hours. HbA1C: No results for input(s): "HGBA1C" in the last 72 hours.  CBG: No results for input(s): "GLUCAP" in the last 168 hours. Lipid Profile: No results  for input(s): "CHOL", "HDL", "LDLCALC", "TRIG", "CHOLHDL", "LDLDIRECT" in the last 72 hours. Thyroid Function Tests: No results for input(s): "TSH", "T4TOTAL", "FREET4", "T3FREE", "THYROIDAB" in the last 72 hours. Anemia Panel: No results for input(s): "VITAMINB12", "FOLATE", "FERRITIN", "TIBC", "IRON", "RETICCTPCT" in the last 72 hours. Sepsis Labs: Recent Labs  Lab 08/23/21 1714 08/24/21 2209 08/25/21 0058  PROCALCITON  --  <0.10  --   LATICACIDVEN 1.6 1.2 0.9     Recent Results (from the past 240 hour(s))  Resp Panel by RT-PCR (Flu A&B, Covid) Anterior Nasal Swab     Status: None   Collection Time: 08/23/21  1:30 AM   Specimen: Anterior Nasal Swab  Result Value Ref Range Status   SARS Coronavirus 2 by RT PCR NEGATIVE NEGATIVE Final    Comment: (NOTE) SARS-CoV-2 target nucleic acids are NOT DETECTED.  The SARS-CoV-2 RNA is generally detectable in upper respiratory specimens during the acute phase of infection. The lowest concentration of SARS-CoV-2 viral copies this assay can detect is 138 copies/mL. A negative result does not preclude SARS-Cov-2 infection and should not be used as the sole basis for treatment or other patient management decisions. A negative result may occur with  improper specimen collection/handling, submission of specimen other than nasopharyngeal swab, presence of viral mutation(s) within the areas targeted by this assay, and inadequate number of viral copies(<138 copies/mL). A negative result must be combined with clinical observations, patient history, and epidemiological information. The expected result is Negative.  Fact Sheet for Patients:  BloggerCourse.com  Fact Sheet for Healthcare Providers:  SeriousBroker.it  This test is no t yet approved or cleared by the [place] FDA and  has been authorized for detection and/or diagnosis of SARS-CoV-2 by FDA under an Emergency Use Authorization  (EUA). This EUA will remain  in effect (meaning this test can be used) for the duration of the COVID-19 declaration under Section 564(b)(1) of the Act, 21 U.S.C.section 360bbb-3(b)(1), unless the authorization is terminated  or revoked sooner.       Influenza A by PCR NEGATIVE NEGATIVE Final   Influenza B by PCR NEGATIVE NEGATIVE Final    Comment: (NOTE) The Xpert Xpress SARS-CoV-2/FLU/RSV plus assay is intended as an aid in the diagnosis of influenza from Nasopharyngeal swab specimens and should not be used as a sole basis for treatment. Nasal washings and aspirates are unacceptable for Xpert Xpress SARS-CoV-2/FLU/RSV testing.  Fact Sheet for Patients: BloggerCourse.com  Fact Sheet for Healthcare Providers: SeriousBroker.it  This test is not yet approved or cleared by the [place] FDA and has been authorized for detection and/or diagnosis of SARS-CoV-2 by FDA under an Emergency Use Authorization (EUA). This EUA will remain in effect (meaning this test  can be used) for the duration of the COVID-19 declaration under Section 564(b)(1) of the Act, 21 U.S.C. section 360bbb-3(b)(1), unless the authorization is terminated or revoked.  Performed at [facility], [address], [place], [place] [address]   Surgical pcr screen     Status: Abnormal   Collection Time: 08/24/21 10:54 PM   Specimen: Nasal Mucosa; Nasal Swab  Result Value Ref Range Status   MRSA, PCR NEGATIVE NEGATIVE Final   Staphylococcus aureus POSITIVE (A) NEGATIVE Final    Comment: (NOTE) The Xpert SA Assay (FDA approved for NASAL specimens in patients 89 years of age and older), is one component of a comprehensive surveillance program. It is not intended to diagnose infection nor to guide or monitor treatment. Performed at [facility], [address] [place], [place] [address]      Radiology Studies: No results found.    Scheduled  Meds:  aspirin EC  325 mg Oral QHS   atorvastatin  40 mg Oral QPM   carvedilol  25 mg Oral BID WC   cholecalciferol  1,000 Units Oral Daily   docusate sodium  100 mg Oral BID   enoxaparin (LOVENOX) injection  70 mg Subcutaneous Q24H   lisinopril  40 mg Oral QPM   polyethylene glycol  17 g Oral Daily   sodium chloride flush  3 mL Intravenous Q12H   spironolactone  25 mg Oral QPM   traMADol  50 mg Oral Q6H   Vitamin D (Ergocalciferol)  50,000 Units Oral Q7 days   Continuous Infusions:  methocarbamol (ROBAXIN) IV       LOS: 7 days   Joycelyn Das, MD [facility] Available via Epic secure chat 7am-7pm After these hours, please refer to coverage provider listed on [email or web address] 08/30/2021, 10:14 AM .

## 2021-08-30 NOTE — Progress Notes (Signed)
[facility] floor coverage for both [facility] and [facility] (remote) on night of 08/29/21 into morning of 08/30/21:    I was notified by RN that the patient's most recent bowel movement occurred on 08/23/2021.  The patient continues to produce flatus, and has a nontender abdomen on exam.  I will defer oral bowel regimen to day hospitalist.     Newton Pigg, DO Hospitalist

## 2021-08-30 NOTE — Progress Notes (Signed)
[facility] floor coverage for both [facility] and [facility] (remote) on night of 08/29/21 into morning of 08/30/21:    I was notified by RN of the patient's request for an anxiolytic medication.  I subsequently placed order for Ativan 0.5 mg IV once prn for anxiety.     Newton Pigg, DO Hospitalist

## 2021-08-31 ENCOUNTER — Inpatient Hospital Stay (HOSPITAL_COMMUNITY)
Admission: RE | Admit: 2021-08-31 | Discharge: 2021-09-12 | DRG: 560 | Disposition: A | Discharge status: Home Health | Payer: Worker's Compensation | Source: Intra-hospital | Attending: Physical Medicine & Rehabilitation | Admitting: Physical Medicine & Rehabilitation

## 2021-08-31 DIAGNOSIS — I110 Hypertensive heart disease with heart failure: Secondary | ICD-10-CM | POA: Diagnosis present

## 2021-08-31 DIAGNOSIS — Z7901 Long term (current) use of anticoagulants: Secondary | ICD-10-CM

## 2021-08-31 DIAGNOSIS — Z888 Allergy status to other drugs, medicaments and biological substances status: Secondary | ICD-10-CM | POA: Diagnosis not present

## 2021-08-31 DIAGNOSIS — Z9861 Coronary angioplasty status: Secondary | ICD-10-CM

## 2021-08-31 DIAGNOSIS — S42402D Unspecified fracture of lower end of left humerus, subsequent encounter for fracture with routine healing: Secondary | ICD-10-CM | POA: Diagnosis not present

## 2021-08-31 DIAGNOSIS — S3210XD Unspecified fracture of sacrum, subsequent encounter for fracture with routine healing: Secondary | ICD-10-CM

## 2021-08-31 DIAGNOSIS — E1165 Type 2 diabetes mellitus with hyperglycemia: Secondary | ICD-10-CM | POA: Diagnosis present

## 2021-08-31 DIAGNOSIS — I2510 Atherosclerotic heart disease of native coronary artery without angina pectoris: Secondary | ICD-10-CM | POA: Diagnosis present

## 2021-08-31 DIAGNOSIS — I5022 Chronic systolic (congestive) heart failure: Secondary | ICD-10-CM | POA: Diagnosis present

## 2021-08-31 DIAGNOSIS — D62 Acute posthemorrhagic anemia: Secondary | ICD-10-CM | POA: Diagnosis present

## 2021-08-31 DIAGNOSIS — Z79899 Other long term (current) drug therapy: Secondary | ICD-10-CM

## 2021-08-31 DIAGNOSIS — S32599A Other specified fracture of unspecified pubis, initial encounter for closed fracture: Secondary | ICD-10-CM

## 2021-08-31 DIAGNOSIS — Z6841 Body Mass Index (BMI) 40.0 and over, adult: Secondary | ICD-10-CM | POA: Diagnosis not present

## 2021-08-31 DIAGNOSIS — E785 Hyperlipidemia, unspecified: Secondary | ICD-10-CM | POA: Diagnosis present

## 2021-08-31 DIAGNOSIS — E1169 Type 2 diabetes mellitus with other specified complication: Secondary | ICD-10-CM | POA: Diagnosis not present

## 2021-08-31 DIAGNOSIS — Z8249 Family history of ischemic heart disease and other diseases of the circulatory system: Secondary | ICD-10-CM

## 2021-08-31 DIAGNOSIS — F419 Anxiety disorder, unspecified: Secondary | ICD-10-CM | POA: Diagnosis not present

## 2021-08-31 DIAGNOSIS — I9589 Other hypotension: Secondary | ICD-10-CM | POA: Diagnosis not present

## 2021-08-31 DIAGNOSIS — T402X5A Adverse effect of other opioids, initial encounter: Secondary | ICD-10-CM | POA: Diagnosis not present

## 2021-08-31 DIAGNOSIS — T1490XA Injury, unspecified, initial encounter: Secondary | ICD-10-CM | POA: Diagnosis not present

## 2021-08-31 DIAGNOSIS — I25810 Atherosclerosis of coronary artery bypass graft(s) without angina pectoris: Secondary | ICD-10-CM | POA: Diagnosis not present

## 2021-08-31 DIAGNOSIS — R7989 Other specified abnormal findings of blood chemistry: Secondary | ICD-10-CM | POA: Diagnosis not present

## 2021-08-31 DIAGNOSIS — K5900 Constipation, unspecified: Secondary | ICD-10-CM | POA: Diagnosis present

## 2021-08-31 DIAGNOSIS — I951 Orthostatic hypotension: Secondary | ICD-10-CM | POA: Diagnosis not present

## 2021-08-31 DIAGNOSIS — I252 Old myocardial infarction: Secondary | ICD-10-CM

## 2021-08-31 DIAGNOSIS — F17210 Nicotine dependence, cigarettes, uncomplicated: Secondary | ICD-10-CM | POA: Diagnosis present

## 2021-08-31 DIAGNOSIS — E559 Vitamin D deficiency, unspecified: Secondary | ICD-10-CM | POA: Diagnosis present

## 2021-08-31 DIAGNOSIS — E119 Type 2 diabetes mellitus without complications: Secondary | ICD-10-CM | POA: Diagnosis present

## 2021-08-31 DIAGNOSIS — S32592D Other specified fracture of left pubis, subsequent encounter for fracture with routine healing: Principal | ICD-10-CM

## 2021-08-31 DIAGNOSIS — S42492D Other displaced fracture of lower end of left humerus, subsequent encounter for fracture with routine healing: Secondary | ICD-10-CM

## 2021-08-31 DIAGNOSIS — K5903 Drug induced constipation: Secondary | ICD-10-CM | POA: Diagnosis not present

## 2021-08-31 DIAGNOSIS — I10 Essential (primary) hypertension: Secondary | ICD-10-CM | POA: Diagnosis not present

## 2021-08-31 DIAGNOSIS — F32A Depression, unspecified: Secondary | ICD-10-CM | POA: Diagnosis present

## 2021-08-31 DIAGNOSIS — M25559 Pain in unspecified hip: Secondary | ICD-10-CM | POA: Diagnosis not present

## 2021-08-31 DIAGNOSIS — W11XXXD Fall on and from ladder, subsequent encounter: Secondary | ICD-10-CM | POA: Diagnosis present

## 2021-08-31 HISTORY — DX: Chronic systolic (congestive) heart failure: I50.22

## 2021-08-31 HISTORY — DX: Heart disease, unspecified: I51.9

## 2021-08-31 HISTORY — DX: Essential (primary) hypertension: I10

## 2021-08-31 HISTORY — DX: Anxiety disorder, unspecified: F41.9

## 2021-08-31 HISTORY — DX: Depression, unspecified: F32.A

## 2021-08-31 LAB — GLUCOSE, CAPILLARY
Glucose-Capillary: 118 mg/dL — ABNORMAL HIGH (ref 70–99)
Glucose-Capillary: 116 mg/dL — ABNORMAL HIGH (ref 70–99)

## 2021-08-31 MED ORDER — OXYCODONE-ACETAMINOPHEN 5-325 MG PO TABS: 1.0000 | ORAL_TABLET | Freq: Four times a day (QID) | ORAL | Status: DC | PRN

## 2021-08-31 MED ORDER — ATORVASTATIN CALCIUM 40 MG PO TABS
40.0000 mg | ORAL_TABLET | Freq: Every evening | ORAL | Status: DC
  Administered 2021-08-31 – 2021-09-11 (×12): 40 mg via ORAL
  Filled 2021-08-31 (×12): qty 1

## 2021-08-31 MED ORDER — TRAMADOL HCL 50 MG PO TABS: 50.0000 mg | ORAL_TABLET | Freq: Four times a day (QID) | ORAL | Status: DC

## 2021-08-31 MED ORDER — APIXABAN 2.5 MG PO TABS
2.5000 mg | ORAL_TABLET | Freq: Two times a day (BID) | ORAL | Status: DC
  Administered 2021-09-01 – 2021-09-12 (×23): 2.5 mg via ORAL
  Filled 2021-08-31 (×23): qty 1

## 2021-08-31 MED ORDER — ALBUTEROL SULFATE (2.5 MG/3ML) 0.083% IN NEBU: 2.5000 mg | INHALATION_SOLUTION | RESPIRATORY_TRACT | 12 refills | Status: DC | PRN

## 2021-08-31 MED ORDER — ACETAMINOPHEN 325 MG PO TABS
325.0000 mg | ORAL_TABLET | ORAL | Status: DC | PRN
  Administered 2021-09-01 – 2021-09-12 (×11): 650 mg via ORAL
  Filled 2021-08-31 (×11): qty 2

## 2021-08-31 MED ORDER — POLYETHYLENE GLYCOL 3350 17 G PO PACK
17.0000 g | PACK | Freq: Every day | ORAL | Status: DC | PRN
Start: 2021-08-31 — End: 2021-09-01

## 2021-08-31 MED ORDER — SPIRONOLACTONE 25 MG PO TABS
25.0000 mg | ORAL_TABLET | Freq: Every evening | ORAL | Status: DC
  Administered 2021-08-31 – 2021-09-04 (×5): 25 mg via ORAL
  Filled 2021-08-31 (×5): qty 1

## 2021-08-31 MED ORDER — DOCUSATE SODIUM 100 MG PO CAPS
100.0000 mg | ORAL_CAPSULE | Freq: Two times a day (BID) | ORAL | Status: DC
  Administered 2021-08-31 – 2021-09-01 (×2): 100 mg via ORAL
  Filled 2021-08-31 (×2): qty 1

## 2021-08-31 MED ORDER — POLYETHYLENE GLYCOL 3350 17 G PO PACK: 17.0000 g | PACK | Freq: Every day | ORAL | 0 refills | Status: DC

## 2021-08-31 MED ORDER — APIXABAN 2.5 MG PO TABS: 2.5000 mg | ORAL_TABLET | Freq: Two times a day (BID) | ORAL | 0 refills | Status: DC

## 2021-08-31 MED ORDER — VITAMIN D (ERGOCALCIFEROL) 1.25 MG (50000 UNIT) PO CAPS: 50000.0000 [IU] | ORAL_CAPSULE | ORAL | Status: DC

## 2021-08-31 MED ORDER — INSULIN ASPART 100 UNIT/ML IJ SOLN: 0.0000 [IU] | Freq: Every day | INTRAMUSCULAR | Status: DC

## 2021-08-31 MED ORDER — TRAMADOL HCL 50 MG PO TABS
50.0000 mg | ORAL_TABLET | Freq: Four times a day (QID) | ORAL | Status: DC
  Administered 2021-08-31 – 2021-09-01 (×3): 50 mg via ORAL
  Filled 2021-08-31 (×4): qty 1

## 2021-08-31 MED ORDER — FLEET ENEMA 7-19 GM/118ML RE ENEM: 1.0000 | ENEMA | Freq: Once | RECTAL | Status: DC | PRN

## 2021-08-31 MED ORDER — DIPHENHYDRAMINE HCL 12.5 MG/5ML PO ELIX: 12.5000 mg | ORAL_SOLUTION | Freq: Four times a day (QID) | ORAL | Status: DC | PRN

## 2021-08-31 MED ORDER — ONDANSETRON HCL 4 MG PO TABS: 4.0000 mg | ORAL_TABLET | Freq: Four times a day (QID) | ORAL | 0 refills | Status: DC | PRN

## 2021-08-31 MED ORDER — BISACODYL 10 MG RE SUPP: 10.0000 mg | Freq: Every day | RECTAL | Status: DC | PRN

## 2021-08-31 MED ORDER — GUAIFENESIN-DM 100-10 MG/5ML PO SYRP: 5.0000 mL | ORAL_SOLUTION | Freq: Four times a day (QID) | ORAL | Status: DC | PRN

## 2021-08-31 MED ORDER — METHOCARBAMOL 500 MG PO TABS: 500.0000 mg | ORAL_TABLET | Freq: Four times a day (QID) | ORAL | Status: DC | PRN

## 2021-08-31 MED ORDER — TRAZODONE HCL 50 MG PO TABS
25.0000 mg | ORAL_TABLET | Freq: Every evening | ORAL | Status: DC | PRN
  Administered 2021-09-07 – 2021-09-11 (×4): 50 mg via ORAL
  Filled 2021-08-31 (×4): qty 1

## 2021-08-31 MED ORDER — ACETAMINOPHEN 325 MG PO TABS: 650.0000 mg | ORAL_TABLET | Freq: Four times a day (QID) | ORAL | Status: DC | PRN

## 2021-08-31 MED ORDER — DOCUSATE SODIUM 100 MG PO CAPS: 100.0000 mg | ORAL_CAPSULE | Freq: Two times a day (BID) | ORAL | Status: DC

## 2021-08-31 MED ORDER — DEXTROSE 5 % IV SOLN
500.0000 mg | Freq: Four times a day (QID) | INTRAVENOUS | Status: DC | PRN
Start: 2021-08-31 — End: 2021-09-08

## 2021-08-31 MED ORDER — PROCHLORPERAZINE 25 MG RE SUPP: 12.5000 mg | Freq: Four times a day (QID) | RECTAL | Status: DC | PRN

## 2021-08-31 MED ORDER — CARVEDILOL 25 MG PO TABS
25.0000 mg | ORAL_TABLET | Freq: Two times a day (BID) | ORAL | Status: DC
  Administered 2021-08-31 – 2021-09-12 (×24): 25 mg via ORAL
  Filled 2021-08-31 (×24): qty 1

## 2021-08-31 MED ORDER — VITAMIN D 25 MCG (1000 UNIT) PO TABS
1000.0000 [IU] | ORAL_TABLET | Freq: Every day | ORAL | Status: DC
  Administered 2021-09-01: 1000 [IU] via ORAL
  Filled 2021-08-31: qty 1

## 2021-08-31 MED ORDER — ALUM & MAG HYDROXIDE-SIMETH 200-200-20 MG/5ML PO SUSP
30.0000 mL | ORAL | Status: DC | PRN
  Administered 2021-09-10: 30 mL via ORAL
  Filled 2021-08-31: qty 30

## 2021-08-31 MED ORDER — VITAMIN D (ERGOCALCIFEROL) 1.25 MG (50000 UNIT) PO CAPS
50000.0000 [IU] | ORAL_CAPSULE | ORAL | Status: DC
  Administered 2021-09-02 – 2021-09-09 (×2): 50000 [IU] via ORAL
  Filled 2021-08-31 (×2): qty 1

## 2021-08-31 MED ORDER — LISINOPRIL 20 MG PO TABS
40.0000 mg | ORAL_TABLET | Freq: Every evening | ORAL | Status: DC
  Administered 2021-08-31 – 2021-09-03 (×4): 40 mg via ORAL
  Filled 2021-08-31 (×4): qty 2

## 2021-08-31 MED ORDER — POLYETHYLENE GLYCOL 3350 17 G PO PACK
17.0000 g | PACK | Freq: Every day | ORAL | Status: DC
  Administered 2021-09-01: 17 g via ORAL
  Filled 2021-08-31 (×2): qty 1

## 2021-08-31 MED ORDER — VITAMIN D3 25 MCG PO TABS: 1000.0000 [IU] | ORAL_TABLET | Freq: Every day | ORAL | Status: DC

## 2021-08-31 MED ORDER — METHOCARBAMOL 500 MG PO TABS
500.0000 mg | ORAL_TABLET | Freq: Four times a day (QID) | ORAL | Status: DC | PRN
Start: 2021-08-31 — End: 2021-09-08
  Administered 2021-09-02 – 2021-09-08 (×8): 500 mg via ORAL
  Filled 2021-08-31 (×8): qty 1

## 2021-08-31 MED ORDER — PROCHLORPERAZINE EDISYLATE 10 MG/2ML IJ SOLN: 5.0000 mg | Freq: Four times a day (QID) | INTRAMUSCULAR | Status: DC | PRN

## 2021-08-31 MED ORDER — METOCLOPRAMIDE HCL 5 MG/ML IJ SOLN: 5.0000 mg | Freq: Three times a day (TID) | INTRAMUSCULAR | Status: DC | PRN

## 2021-08-31 MED ORDER — INSULIN ASPART 100 UNIT/ML IJ SOLN
0.0000 [IU] | Freq: Three times a day (TID) | INTRAMUSCULAR | Status: DC
  Administered 2021-09-01 – 2021-09-03 (×3): 1 [IU] via SUBCUTANEOUS

## 2021-08-31 MED ORDER — ALBUTEROL SULFATE (2.5 MG/3ML) 0.083% IN NEBU: 2.5000 mg | INHALATION_SOLUTION | RESPIRATORY_TRACT | Status: DC | PRN

## 2021-08-31 MED ORDER — PROCHLORPERAZINE MALEATE 5 MG PO TABS: 5.0000 mg | ORAL_TABLET | Freq: Four times a day (QID) | ORAL | Status: DC | PRN

## 2021-08-31 MED ORDER — ASPIRIN 325 MG PO TBEC
325.0000 mg | DELAYED_RELEASE_TABLET | Freq: Every day | ORAL | Status: DC
  Administered 2021-08-31 – 2021-09-11 (×12): 325 mg via ORAL
  Filled 2021-08-31 (×12): qty 1

## 2021-08-31 MED ORDER — METOCLOPRAMIDE HCL 5 MG PO TABS: 5.0000 mg | ORAL_TABLET | Freq: Three times a day (TID) | ORAL | Status: DC | PRN

## 2021-08-31 NOTE — Progress Notes (Signed)
Pt is being transferred to CIR. Report is given to [place], Charity fundraiser.

## 2021-08-31 NOTE — Discharge Summary (Addendum)
Physician Discharge Summary   Patient: Mathew Oliver MRN: [id number] DOB: Mar 14, 1961  Admit date:     08/23/2021  Discharge date: 08/31/21  Discharge Physician: Joycelyn Das   PCP: Pcp, No   Recommendations at discharge:  Follow-up with your primary care provider after discharge from rehabilitation. Follow-up with orthopedics after discharge.  Discharge Diagnoses: Active Problems:   Multiple closed stable fractures of pubic ramus (HCC)   Sacral fracture (HCC)   Heart failure with reduced ejection fraction (HCC)   Essential hypertension   CAD S/P percutaneous coronary angioplasty   Obesity, Class III, BMI 40-49.9 (morbid obesity) (HCC)   Vitamin D deficiency  Principal Problem (Resolved):   Open fracture of left distal humerus Resolved Problems:   Accidental fall from ladder   Lactic acidosis   Leukocytosis  Hospital Course: Mathew Oliver is a 61 y.o. male with past medical history significant for essential hypertension, hyperlipidemia, coronary disease, chronic systolic congestive heart failure  obesity presented to the hospital with left elbow pain following a fall after losing his balance.  He hit his left elbow and hip on fall.  Patient was brought into the hospital on a splint.  In the ED, patient was seen as a level 2 trauma.  Vitals with mild tachypnea with pulse ox of 97% 2 L nasal cannula.  WBC was mildly elevated at 12.6 with hemoglobin of 14.7.   Lactic acid 2.0.  COVID-19 PCR negative.  Influenza A/B PCR negative.  Urinalysis was negative for infection. Pelvis x-ray with left pubic bone, superior pubic ramus and inferior pubic ramus fractures.  Left humerus x-ray with comminuted, displaced distal left humeral fracture.  CT left upper extremity with comminuted intra-articular fracture/dislocation at the elbow with severely displaced distal humerus fracture, transverse supracondylar component with splitting of the epicondyles, partial reticulation of the radius with a  capitulum, nondisplaced radial head fracture, large joint effusion and probable hematoma, extensive soft tissue swelling and soft tissue gas suggesting open fracture.  CT head/C-spine with no acute intracranial abnormality, no acute/traumatic cervical spine pathology.  CT chest abdomen/pelvis with pelvic fractures including left superior/inferior pubic rami and left sacrum, otherwise no evidence of significant trauma or acute findings in the chest/abdomen/pelvis.  Orthopedics was consulted and patient was admitted hospital for further evaluation and treatment.   Assessment and Plan:  Left open comminuted distal humerus fracture Status post mechanical fall.  Patient underwent open reduction internal fixation of the left distal femur fracture, primary repair of partial triceps tendon tear, ulnar osteotomy and irrigation and debridement of the left open distal humerus fracture on 08/25/2021 by orthopedics.  Orthopedics recommended nonweight bearing of the affected extremity.  Continue supportive care and analgesia.  Patient will be transitioned to CIR at this time.   Sacrum, pubic rami fracture Pelvis x-ray with left pubic bone fracture, superior pubic rami fracture, inferior pubic ramus fracture orthopedics has seen the patient and recommended weightbearing as tolerated.  Transitioned to CIR at this time    Acute hypoxic respiratory failure Resolved at this time.  History of  chronic congestive heart failure.  2D echocardiogram with systolic dysfunction.  Patient received IV Lasix x2 during hospitalization.   Leukocytosis/Lactic acidosis: Resolved Thought to be likely reactive.  Latest WBC at 10.1.   Chronic systolic congestive heart failure,   2D echocardiogram done on 08/24/2021 showed LV ejection fraction of 40 to 45% with grade 2 diastolic dysfunction.  No significant changes compared to prior.  Currently on Coreg, spironolactone and lisinopril.  Received IV Lasix x2 during hospitalization.     Constipation.  We will continue with MiraLAX and Colace on discharge.   Essential hypertension Continue Coreg lisinopril, spironolactone.  Blood pressure stable at this time.   CAD s/p PCI   Continue aspirin, statins and beta-blockers.  Follow-up with cardiology as outpatient.     Hyperlipidemia: Continue Lipitor.   Morbid obesity Body mass index is 39.95 kg/m.  Would benefit from weight loss as outpatient.   Vitamin D deficiency.  Continue 50,000 unit vitamin D weekly.   Consultants:  Orthopedics  Procedures performed:  Open reduction internal fixation of the left distal femur fracture, primary repair of partial triceps tendon tear, ulnar osteotomy and irrigation and debridement of the left open distal humerus fracture on 08/25/2021 by orthopedics.    Disposition: Rehabilitation facility  Diet recommendation:  Discharge Diet Orders (From admission, onward)     Start     Ordered   08/31/21 0000  Diet - low sodium heart healthy        08/31/21 0900           Cardiac diet DISCHARGE MEDICATION: Allergies as of 08/31/2021       Reactions   Niacin And Related         Medication List     STOP taking these medications    aspirin EC 325 MG tablet   naproxen sodium 220 MG tablet Commonly known as: ALEVE       TAKE these medications    acetaminophen 325 MG tablet Commonly known as: TYLENOL Take 2 tablets (650 mg total) by mouth every 6 (six) hours as needed for mild pain (or Fever >/= 101).   albuterol (2.5 MG/3ML) 0.083% nebulizer solution Commonly known as: PROVENTIL Take 3 mLs (2.5 mg total) by nebulization every 2 (two) hours as needed for wheezing or shortness of breath.   apixaban 2.5 MG Tabs tablet Commonly known as: Eliquis Take 1 tablet (2.5 mg total) by mouth 2 (two) times daily for 22 days.   atorvastatin 40 MG tablet Commonly known as: LIPITOR Take 40 mg by mouth every evening.   carvedilol 25 MG tablet Commonly known as: COREG Take 25  mg by mouth 2 (two) times daily with a meal.   docusate sodium 100 MG capsule Commonly known as: COLACE Take 1 capsule (100 mg total) by mouth 2 (two) times daily.   lisinopril 40 MG tablet Commonly known as: ZESTRIL Take 40 mg by mouth every evening.   methocarbamol 500 MG tablet Commonly known as: ROBAXIN Take 1 tablet (500 mg total) by mouth every 6 (six) hours as needed for muscle spasms.   multivitamin Tabs tablet Take 1 tablet by mouth daily.   ondansetron 4 MG tablet Commonly known as: ZOFRAN Take 1 tablet (4 mg total) by mouth every 6 (six) hours as needed for nausea.   oxyCODONE-acetaminophen 5-325 MG tablet Commonly known as: PERCOCET/ROXICET Take 1-2 tablets by mouth every 6 (six) hours as needed for moderate pain or severe pain.   polyethylene glycol 17 g packet Commonly known as: MIRALAX / GLYCOLAX Take 17 g by mouth daily.   spironolactone 25 MG tablet Commonly known as: ALDACTONE Take 25 mg by mouth every evening.   traMADol 50 MG tablet Commonly known as: ULTRAM Take 1 tablet (50 mg total) by mouth every 6 (six) hours.   Vitamin D (Ergocalciferol) 1.25 MG (50000 UNIT) Caps capsule Commonly known as: DRISDOL Take 1 capsule (50,000 Units total) by mouth every 7 (  seven) days. Start taking on: September 02, 2021   Vitamin D3 25 MCG tablet Commonly known as: Vitamin D Take 1 tablet (1,000 Units total) by mouth daily.       subjective: Today, patient was seen and examined at bedside.  Patient feels okay.  Denies interval complaints.  Has not had a bowel movement.  Discharge Exam: Filed Weights   08/26/21 0500 08/30/21 0545 08/31/21 0500  Weight: (!) 147.2 kg 133.6 kg 131.7 kg      08/31/2021    9:17 AM 08/31/2021    5:00 AM 08/31/2021    3:00 AM  Vitals with BMI  Weight  290 lbs 6 oz   BMI  99991111   Systolic [id number]  0000000  Diastolic 80  73  Pulse 64  66   Body mass index is 39.38 kg/m.   General: Morbidly obese built, not in obvious distress HENT:    No scleral pallor or icterus noted. Oral mucosa is moist.  Chest:  Diminished breath sounds bilaterally. No crackles or wheezes.  CVS: S1 &S2 heard. No murmur.  Regular rate and rhythm. Abdomen: Soft, nontender, nondistended.  Bowel sounds are heard.   Extremities: No cyanosis, clubbing or edema.  Peripheral pulses are palpable.  Left upper extremity in a splint. Psych: Alert, awake and oriented, normal mood CNS:  No cranial nerve deficits.  Moves all extremities. Skin: Warm and dry.  No rashes noted.   Condition at discharge: good  The results of significant diagnostics from this hospitalization (including imaging, microbiology, ancillary and laboratory) are listed below for reference.   Imaging Studies: DG Elbow 2 Views Left  Result Date: 08/25/2021 CLINICAL DATA:  Fracture distal left humerus EXAM: LEFT ELBOW - 2 VIEW COMPARISON:  08/23/2021 FINDINGS: There is interval reduction and internal fixation of comminuted fractures of distal humerus. There is marked improvement in alignment of fracture fragments. There is surgical screw in the proximal ulna. IMPRESSION: Reduction and internal fixation of comminuted displaced fracture of distal left humerus. Electronically Signed   By: Elmer Picker M.D.   On: 08/25/2021 16:35   DG Humerus Left  Result Date: 08/25/2021 CLINICAL DATA:  Distal humeral ORIF EXAM: LEFT HUMERUS - 2+ VIEW COMPARISON:  None Available. FINDINGS: Multiple intraoperative fluoroscopic spot images are provided. Comminuted distal humeral fracture transfixed with a dorsal and medial sideplate with multiple interlocking screws. FLUOROSCOPY TIME:  Radiation Exposure Index: 2.83 mGy IMPRESSION: 1. Interval ORIF of a left distal humeral fracture. Electronically Signed   By: Kathreen Devoid M.D.   On: 08/25/2021 14:53   DG C-Arm 1-60 Min-No Report  Result Date: 08/25/2021 Fluoroscopy was utilized by the requesting physician.  No radiographic interpretation.   DG C-Arm 1-60  Min-No Report  Result Date: 08/25/2021 Fluoroscopy was utilized by the requesting physician.  No radiographic interpretation.   ECHOCARDIOGRAM COMPLETE  Result Date: 08/24/2021    ECHOCARDIOGRAM REPORT   Patient Name:   RIOTT FULLER Date of Exam: 08/24/2021 Medical Rec #:  [id number]        Height:       72.0 in Accession #:    [id number]       Weight:       308.0 lb Date of Birth:  [birth year]        BSA:          2.560 m Patient Age:    76 years         BP:  116/76 mmHg Patient Gender: M                HR:           75 bpm. Exam Location:  Inpatient Procedure: 2D Echo, Cardiac Doppler, Color Doppler and Intracardiac            Opacification Agent Indications:    Abnormal EKG  History:        Patient has prior history of Echocardiogram examinations.                 Abnormal ECG. Larey Seat off a ladder and broke his elbow.  Sonographer:    Roosvelt Maser RDCS Referring Phys: [id number] RONDELL A SMITH IMPRESSIONS  1. Left ventricular ejection fraction, by estimation, is 40 to 45%. The left ventricle has mildly decreased function. The left ventricle demonstrates regional wall motion abnormalities (see scoring diagram/findings for description). The left ventricular  internal cavity size was mildly dilated. There is mild concentric left ventricular hypertrophy. Left ventricular diastolic parameters are consistent with Grade II diastolic dysfunction (pseudonormalization). There is hypokinesis of the left ventricular,  basal-mid inferoseptal wall and inferior wall.  2. Right ventricular systolic function is normal. The right ventricular size is mildly enlarged. Tricuspid regurgitation signal is inadequate for assessing PA pressure.  3. Left atrial size was moderately dilated.  4. Right atrial size was mildly dilated.  5. The mitral valve was not well visualized. Trivial mitral valve regurgitation. No evidence of mitral stenosis.  6. The aortic valve was not well visualized. Aortic valve regurgitation is not  visualized. No aortic stenosis is present. Comparison(s): Prior images unable to be directly viewed, comparison made by report only. No significant change from prior study. [facility] echo 08/21/19: EF 40-45%, inferolateral hypokinesis. Conclusion(s)/Recommendation(s): Technically challenging images, even with use of echo contrast. EF and wall motion as noted, no significant valve disease by doppler. FINDINGS  Left Ventricle: Left ventricular ejection fraction, by estimation, is 40 to 45%. The left ventricle has mildly decreased function. The left ventricle demonstrates regional wall motion abnormalities. Definity contrast agent was given IV to delineate the left ventricular endocardial borders. The left ventricular internal cavity size was mildly dilated. There is mild concentric left ventricular hypertrophy. Left ventricular diastolic parameters are consistent with Grade II diastolic dysfunction (pseudonormalization). Right Ventricle: The right ventricular size is mildly enlarged. Right vetricular wall thickness was not well visualized. Right ventricular systolic function is normal. Tricuspid regurgitation signal is inadequate for assessing PA pressure. Left Atrium: Left atrial size was moderately dilated. Right Atrium: Right atrial size was mildly dilated. Pericardium: There is no evidence of pericardial effusion. Mitral Valve: The mitral valve was not well visualized. Trivial mitral valve regurgitation. No evidence of mitral valve stenosis. Tricuspid Valve: The tricuspid valve is not well visualized. Tricuspid valve regurgitation is trivial. No evidence of tricuspid stenosis. Aortic Valve: The aortic valve was not well visualized. Aortic valve regurgitation is not visualized. No aortic stenosis is present. Aortic valve mean gradient measures 4.0 mmHg. Aortic valve peak gradient measures 6.5 mmHg. Aortic valve area, by VTI measures 2.24 cm. Pulmonic Valve: The pulmonic valve was not well visualized. Pulmonic valve  regurgitation is not visualized. Aorta: The aortic root was not well visualized and the ascending aorta was not well visualized. IAS/Shunts: The interatrial septum was not well visualized.  LEFT VENTRICLE PLAX 2D LVIDd:         5.25 cm   Diastology LVIDs:         3.99 cm  LV e' medial:    4.95 cm/s LV PW:         1.40 cm   LV E/e' medial:  13.6 LV IVS:        1.30 cm   LV e' lateral:   12.60 cm/s LVOT diam:     2.10 cm   LV E/e' lateral: 5.3 LV SV:         50 LV SV Index:   19 LVOT Area:     3.46 cm  RIGHT VENTRICLE RV Basal diam:  4.20 cm RV Mid diam:    5.00 cm RV S prime:     12.10 cm/s TAPSE (M-mode): 2.6 cm LEFT ATRIUM              Index        RIGHT ATRIUM           Index LA diam:        6.20 cm  2.42 cm/m   RA Area:     23.10 cm LA Vol (A2C):   120.0 ml 46.88 ml/m  RA Volume:   63.40 ml  24.77 ml/m LA Vol (A4C):   115.0 ml 44.93 ml/m LA Biplane Vol: 125.0 ml 48.83 ml/m  AORTIC VALVE AV Area (Vmax):    2.15 cm AV Area (Vmean):   2.02 cm AV Area (VTI):     2.24 cm AV Vmax:           127.00 cm/s AV Vmean:          88.400 cm/s AV VTI:            0.221 m AV Peak Grad:      6.5 mmHg AV Mean Grad:      4.0 mmHg LVOT Vmax:         79.00 cm/s LVOT Vmean:        51.600 cm/s LVOT VTI:          0.143 m LVOT/AV VTI ratio: 0.65  AORTA Ao Root diam: 3.90 cm Ao Asc diam:  3.70 cm MITRAL VALVE MV Area (PHT): 4.06 cm    SHUNTS MV Decel Time: 187 msec    Systemic VTI:  0.14 m MV E velocity: 67.40 cm/s  Systemic Diam: 2.10 cm MV A velocity: 81.30 cm/s MV E/A ratio:  0.83 Buford Dresser MD Electronically signed by Buford Dresser MD Signature Date/Time: 08/24/2021/3:38:56 PM    Final    CT Elbow Left Wo Contrast  Result Date: 08/23/2021 CLINICAL DATA:  Elbow trauma EXAM: CT OF THE UPPER LEFT EXTREMITY WITHOUT CONTRAST TECHNIQUE: Multidetector CT imaging of the upper left extremity was performed according to the standard protocol. RADIATION DOSE REDUCTION: This exam was performed according to the  departmental dose-optimization program which includes automated exposure control, adjustment of the mA and/or kV according to patient size and/or use of iterative reconstruction technique. COMPARISON:  None Available. FINDINGS: Bones/Joint/Cartilage There is a comminuted, intra-articular fracture of the distal humerus, with transverse component through the supracondylar humerus and split of the epicondyles. Nondisplaced radial head fracture which partially articulates with the displaced capitellum. There is malalignment of the ulnar trochlear joint with medial displacement of the medial epicondyle/trochlea. The proximal radioulnar joint appears intact. There is a large joint effusion. Ligaments Suboptimally assessed by CT. Muscles and Tendons Edematous appearance of the elbow musculature. There is intramuscular gas present. Soft tissues Extensive soft tissue swelling and soft tissue gas. Probable hematoma along the elbow joint. IMPRESSION: Comminuted intra-articular fracture-dislocation at the elbow with  severely displaced distal humerus fracture. There is a transverse supracondylar component with splitting of the epicondyles. Partial articulation of the radius with the capitellum. Ulnotrochlear malalignment with medially displaced medial epicondyle/trochlea fragment. Preserved proximal radioulnar articulation. Nondisplaced radial head fracture. Large joint effusion and probable hematoma along the elbow. Extensive soft tissue swelling and soft tissue gas suggesting this is an open fracture. Electronically Signed   By: Maurine Simmering M.D.   On: 08/23/2021 08:11   DG Pelvis 1-2 Views  Result Date: 08/23/2021 CLINICAL DATA:  Fall EXAM: PELVIS - 1-2 VIEW COMPARISON:  CT earlier today. FINDINGS: Left pubic fracture noted extending into the left superior pubic ramus. Left inferior pubic ramus fracture also seen. The left sacral fracture seen on CT not as well visualized by plain film. Proximal femurs unremarkable. No  dislocation. IMPRESSION: Left pubic bone, superior pubic ramus and inferior pubic ramus fractures. Electronically Signed   By: Rolm Baptise M.D.   On: 08/23/2021 02:49   DG Humerus Left  Result Date: 08/23/2021 CLINICAL DATA:  Fall EXAM: LEFT HUMERUS - 2+ VIEW COMPARISON:  Left elbow series today FINDINGS: Distal left humeral fracture noted. Significantly displaced fracture fragments. No additional humeral fracture. IMPRESSION: Comminuted, displaced distal left humeral fracture. Electronically Signed   By: Rolm Baptise M.D.   On: 08/23/2021 02:49   DG Elbow Complete Left  Result Date: 08/23/2021 CLINICAL DATA:  Fall from ladder EXAM: LEFT ELBOW - COMPLETE 3+ VIEW COMPARISON:  None Available. FINDINGS: Comminuted displaced distal left humeral fracture. Marked displacement of fracture fragments with possible dislocation of the ulna relative to the distal humeral fragments. Study is limited due to patient condition. IMPRESSION: Comminuted, displaced distal left humeral fracture. Possible dislocation of the ulna relative to the distal humeral fragments. Electronically Signed   By: Rolm Baptise M.D.   On: 08/23/2021 02:48   DG Forearm Left  Result Date: 08/23/2021 CLINICAL DATA:  Fall from ladder EXAM: LEFT FOREARM - 2 VIEW COMPARISON:  Left elbow series today FINDINGS: Distal left humeral fracture noted. See elbow series for further discussion. No forearm fracture. IMPRESSION: Distal left humeral fracture. See elbow series for further discussion. Electronically Signed   By: Rolm Baptise M.D.   On: 08/23/2021 02:47   CT CHEST ABDOMEN PELVIS W CONTRAST  Result Date: 08/23/2021 CLINICAL DATA:  Polytrauma, blunt.  Fall from ladder. EXAM: CT CHEST, ABDOMEN, AND PELVIS WITH CONTRAST TECHNIQUE: Multidetector CT imaging of the chest, abdomen and pelvis was performed following the standard protocol during bolus administration of intravenous contrast. RADIATION DOSE REDUCTION: This exam was performed according to  the departmental dose-optimization program which includes automated exposure control, adjustment of the mA and/or kV according to patient size and/or use of iterative reconstruction technique. CONTRAST:  174mL OMNIPAQUE IOHEXOL 300 MG/ML  SOLN COMPARISON:  None Available. FINDINGS: CT CHEST FINDINGS Cardiovascular: Heart is normal size. Aorta is normal caliber. Coronary artery and aortic calcifications. Mediastinum/Nodes: No mediastinal, hilar, or axillary adenopathy. Trachea and esophagus are unremarkable. Thyroid unremarkable. Lungs/Pleura: Minimal dependent atelectasis in the lower lobes. No effusions or pneumothorax. Musculoskeletal: Chest wall soft tissues are unremarkable. No acute bony abnormality. CT ABDOMEN PELVIS FINDINGS Hepatobiliary: No hepatic injury or perihepatic hematoma. Diffuse low-density throughout the liver compatible with fatty infiltration. No focal hepatic abnormality. Gallbladder is unremarkable. Pancreas: No focal abnormality or ductal dilatation. Spleen: No splenic injury or perisplenic hematoma. Adrenals/Urinary Tract: No adrenal hemorrhage or renal injury identified. Bladder is unremarkable. Bilateral renal cysts. No hydronephrosis. Stomach/Bowel: Normal appendix. Left colonic  diverticulosis. No active diverticulitis. Stomach and small bowel unremarkable. Vascular/Lymphatic: Aortic atherosclerosis. No evidence of aneurysm or adenopathy. Reproductive: No visible focal abnormality. Other: No free fluid or free air. Musculoskeletal: Fractures through the left superior and inferior pubic rami. No proximal femoral fracture. Fracture through the left sacral ala. IMPRESSION: Pelvic fractures including left superior and inferior pubic rami and left sacrum. Otherwise, no evidence of significant trauma or acute findings in the chest, abdomen or pelvis. Hepatic steatosis. Coronary artery disease, aortic atherosclerosis. Electronically Signed   By: Rolm Baptise M.D.   On: 08/23/2021 02:28   CT  HEAD WO CONTRAST  Result Date: 08/23/2021 CLINICAL DATA:  Trauma. EXAM: CT HEAD WITHOUT CONTRAST CT CERVICAL SPINE WITHOUT CONTRAST TECHNIQUE: Multidetector CT imaging of the head and cervical spine was performed following the standard protocol without intravenous contrast. Multiplanar CT image reconstructions of the cervical spine were also generated. RADIATION DOSE REDUCTION: This exam was performed according to the departmental dose-optimization program which includes automated exposure control, adjustment of the mA and/or kV according to patient size and/or use of iterative reconstruction technique. COMPARISON:  None Available. FINDINGS: CT HEAD FINDINGS Brain: The ventricles and sulci are appropriate size for the patient's age. The gray-white matter discrimination is preserved. There is no acute intracranial hemorrhage. No mass effect or midline shift. No extra-axial fluid collection. Vascular: No hyperdense vessel or unexpected calcification. Skull: Normal. Negative for fracture or focal lesion. Sinuses/Orbits: No acute finding. Other: None CT CERVICAL SPINE FINDINGS Alignment: No acute subluxation. Skull base and vertebrae: No acute fracture. Soft tissues and spinal canal: No prevertebral fluid or swelling. No visible canal hematoma. Disc levels: Multilevel degenerative changes with disc space narrowing and endplate irregularity and spurring/osteophyte. Upper chest: Negative. Other: Mild bilateral carotid bulb calcified plaques. IMPRESSION: 1. No acute intracranial abnormality. 2. No acute/traumatic cervical spine pathology. Multilevel degenerative changes. Electronically Signed   By: Anner Crete M.D.   On: 08/23/2021 02:27   CT CERVICAL SPINE WO CONTRAST  Result Date: 08/23/2021 CLINICAL DATA:  Trauma. EXAM: CT HEAD WITHOUT CONTRAST CT CERVICAL SPINE WITHOUT CONTRAST TECHNIQUE: Multidetector CT imaging of the head and cervical spine was performed following the standard protocol without intravenous  contrast. Multiplanar CT image reconstructions of the cervical spine were also generated. RADIATION DOSE REDUCTION: This exam was performed according to the departmental dose-optimization program which includes automated exposure control, adjustment of the mA and/or kV according to patient size and/or use of iterative reconstruction technique. COMPARISON:  None Available. FINDINGS: CT HEAD FINDINGS Brain: The ventricles and sulci are appropriate size for the patient's age. The gray-white matter discrimination is preserved. There is no acute intracranial hemorrhage. No mass effect or midline shift. No extra-axial fluid collection. Vascular: No hyperdense vessel or unexpected calcification. Skull: Normal. Negative for fracture or focal lesion. Sinuses/Orbits: No acute finding. Other: None CT CERVICAL SPINE FINDINGS Alignment: No acute subluxation. Skull base and vertebrae: No acute fracture. Soft tissues and spinal canal: No prevertebral fluid or swelling. No visible canal hematoma. Disc levels: Multilevel degenerative changes with disc space narrowing and endplate irregularity and spurring/osteophyte. Upper chest: Negative. Other: Mild bilateral carotid bulb calcified plaques. IMPRESSION: 1. No acute intracranial abnormality. 2. No acute/traumatic cervical spine pathology. Multilevel degenerative changes. Electronically Signed   By: Anner Crete M.D.   On: 08/23/2021 02:27   DG Chest Port 1 View  Result Date: 08/23/2021 CLINICAL DATA:  Fall EXAM: PORTABLE CHEST 1 VIEW COMPARISON:  None Available. FINDINGS: Evaluation is limited due to positioning and  superimposition the trauma board. Shallow inspiration. No focal consolidation, pleural effusion, pneumothorax. Mild cardiomegaly with mild vascular congestion. There is apparent widening of the mediastinum which may be positional. Attention on CT recommended. No obvious acute osseous pathology. IMPRESSION: Mild cardiomegaly with mild central vascular congestion.  Electronically Signed   By: Elgie Collard M.D.   On: 08/23/2021 01:36    Microbiology: Results for orders placed or performed during the hospital encounter of 08/23/21  Resp Panel by RT-PCR (Flu A&B, Covid) Anterior Nasal Swab     Status: None   Collection Time: 08/23/21  1:30 AM   Specimen: Anterior Nasal Swab  Result Value Ref Range Status   SARS Coronavirus 2 by RT PCR NEGATIVE NEGATIVE Final    Comment: (NOTE) SARS-CoV-2 target nucleic acids are NOT DETECTED.  The SARS-CoV-2 RNA is generally detectable in upper respiratory specimens during the acute phase of infection. The lowest concentration of SARS-CoV-2 viral copies this assay can detect is 138 copies/mL. A negative result does not preclude SARS-Cov-2 infection and should not be used as the sole basis for treatment or other patient management decisions. A negative result may occur with  improper specimen collection/handling, submission of specimen other than nasopharyngeal swab, presence of viral mutation(s) within the areas targeted by this assay, and inadequate number of viral copies(<138 copies/mL). A negative result must be combined with clinical observations, patient history, and epidemiological information. The expected result is Negative.  Fact Sheet for Patients:  BloggerCourse.com  Fact Sheet for Healthcare Providers:  SeriousBroker.it  This test is no t yet approved or cleared by the [place] FDA and  has been authorized for detection and/or diagnosis of SARS-CoV-2 by FDA under an Emergency Use Authorization (EUA). This EUA will remain  in effect (meaning this test can be used) for the duration of the COVID-19 declaration under Section 564(b)(1) of the Act, 21 U.S.C.section 360bbb-3(b)(1), unless the authorization is terminated  or revoked sooner.       Influenza A by PCR NEGATIVE NEGATIVE Final   Influenza B by PCR NEGATIVE NEGATIVE Final     Comment: (NOTE) The Xpert Xpress SARS-CoV-2/FLU/RSV plus assay is intended as an aid in the diagnosis of influenza from Nasopharyngeal swab specimens and should not be used as a sole basis for treatment. Nasal washings and aspirates are unacceptable for Xpert Xpress SARS-CoV-2/FLU/RSV testing.  Fact Sheet for Patients: BloggerCourse.com  Fact Sheet for Healthcare Providers: SeriousBroker.it  This test is not yet approved or cleared by the [place] FDA and has been authorized for detection and/or diagnosis of SARS-CoV-2 by FDA under an Emergency Use Authorization (EUA). This EUA will remain in effect (meaning this test can be used) for the duration of the COVID-19 declaration under Section 564(b)(1) of the Act, 21 U.S.C. section 360bbb-3(b)(1), unless the authorization is terminated or revoked.  Performed at [facility], [address], [place], [place] [address]   Surgical pcr screen     Status: Abnormal   Collection Time: 08/24/21 10:54 PM   Specimen: Nasal Mucosa; Nasal Swab  Result Value Ref Range Status   MRSA, PCR NEGATIVE NEGATIVE Final   Staphylococcus aureus POSITIVE (A) NEGATIVE Final    Comment: (NOTE) The Xpert SA Assay (FDA approved for NASAL specimens in patients 23 years of age and older), is one component of a comprehensive surveillance program. It is not intended to diagnose infection nor to guide or monitor treatment. Performed at [facility], [address], [place], [place] [address]  Labs: CBC: Recent Labs  Lab 08/24/21 2209 08/25/21 0058 08/26/21 0126 08/27/21 0151  WBC 12.5* 10.9* 13.5* 10.1  HGB 12.7* 12.2* 12.9* 11.8*  HCT 36.6* 36.7* 38.5* 34.4*  MCV 94.1 94.3 93.9 93.5  PLT 169 164 198 [id number]   Basic Metabolic Panel: Recent Labs  Lab 08/24/21 2209 08/25/21 0058 08/26/21 0126 08/27/21 0151  NA 136 137 136 136  K 3.9 3.7 4.2 3.9  CL 100 102 102 99  CO2 28 28 27   32  GLUCOSE 164* 177* 186* 153*  BUN 20 20 14 17   CREATININE 1.01 0.96 0.64 0.68  CALCIUM 8.3* 8.4* 8.3* 8.3*  MG  --  2.2 2.2  --    Liver Function Tests: Recent Labs  Lab 08/24/21 2209  AST 18  ALT 20  ALKPHOS 44  BILITOT 0.9  PROT 5.5*  ALBUMIN 2.8*   CBG: No results for input(s): "GLUCAP" in the last 168 hours.  Discharge time spent: greater than 30 minutes.  Signed: Flora Lipps, MD [facility] 08/31/2021

## 2021-08-31 NOTE — H&P (Signed)
Physical Medicine and Rehabilitation Admission H&P    Chief Complaint  Patient presents with   Polytrauma with functional deficits    HPI: Mathew Oliver is a 61 year old male with history of CAD, systolic heart failure, HTN, hyperlipidemia; who was admitted on 08/23/2021 after tripping and falling from a 25 foot ladder at work.  He was noted to be mildly tachypneic at admission and had leukocytosis.  He was placed on oxygen per nasal cannula and work-up revealed left superior and inferior pubic rami and left sacrum fractures, complex left distal humerus fracture which was placed in long-arm splint. Dr. Jena Gauss was complete consulted due to complexity of injuries and recommended WBAT BLE and surgical fixation due to significance of injury.  He was taken to the OR on 08/25/2020 for ORIF left distal humerus with primary repair of partial triceps tendon tear, ulna osteotomy as well as I&D of left open distal humerus fracture.   He is to be NWB LUE with unrestricted range of motion at left elbow as well as Lovenox for DVT prophylaxis with transition to DOAC.  He was treated with IV fluids for hydration however developed acute hypoxic respiratory failure requiring IV diuresis respiratory status has improved and he was weaned off oxygen.  Reactive leukocytosis has resolved and pain control slowly improving.  PT OT has been working with patient and he continued to be limited by fatigue, weakness, weightbearing restrictions and difficulty weightbearing through BLE.  Patient reports his most significant pain is at his pelvis and of his legs.  Patient has had a decline in functional status and CIR was recommended for follow-up therapy   Review of Systems  Constitutional:  Negative for chills and fever.  Eyes:  Negative for double vision.  Respiratory:  Negative for cough and shortness of breath.   Cardiovascular:  Negative for chest pain.  Gastrointestinal:  Negative for abdominal pain.   Genitourinary: Negative.   Musculoskeletal:  Positive for joint pain.  Neurological:  Positive for weakness.       Past Medical History:  Diagnosis Date   MI, acute, non ST segment elevation (HCC)     Past Surgical History:  Procedure Laterality Date   CARDIAC CATHETERIZATION     ORIF HUMERUS FRACTURE Left 08/25/2021   Procedure: OPEN REDUCTION INTERNAL FIXATION (ORIF) DISTAL HUMERUS FRACTURE;  Surgeon: Roby Lofts, MD;  Location: [facility] OR;  Service: Orthopedics;  Laterality: Left;    No family history on file.   Social History:  reports that he has been smoking cigarettes. He does not have any smokeless tobacco history on file. He reports current alcohol use. He reports that he does not use drugs.   Allergies  Allergen Reactions   Niacin And Related     Medications Prior to Admission  Medication Sig Dispense Refill   acetaminophen (TYLENOL) 325 MG tablet Take 2 tablets (650 mg total) by mouth every 6 (six) hours as needed for mild pain (or Fever >/= 101).     albuterol (PROVENTIL) (2.5 MG/3ML) 0.083% nebulizer solution Take 3 mLs (2.5 mg total) by nebulization every 2 (two) hours as needed for wheezing or shortness of breath. 75 mL 12   apixaban (ELIQUIS) 2.5 MG TABS tablet Take 1 tablet (2.5 mg total) by mouth 2 (two) times daily for 22 days. 44 tablet 0   atorvastatin (LIPITOR) 40 MG tablet Take 40 mg by mouth every evening.     carvedilol (COREG) 25 MG tablet Take 25 mg by  mouth 2 (two) times daily with a meal.     cholecalciferol (VITAMIN D) 25 MCG tablet Take 1 tablet (1,000 Units total) by mouth daily.     docusate sodium (COLACE) 100 MG capsule Take 1 capsule (100 mg total) by mouth 2 (two) times daily.     lisinopril (ZESTRIL) 40 MG tablet Take 40 mg by mouth every evening.     methocarbamol (ROBAXIN) 500 MG tablet Take 1 tablet (500 mg total) by mouth every 6 (six) hours as needed for muscle spasms.     multivitamin (ONE-A-DAY MEN'S) TABS tablet Take 1 tablet by  mouth daily.     ondansetron (ZOFRAN) 4 MG tablet Take 1 tablet (4 mg total) by mouth every 6 (six) hours as needed for nausea.  0   oxyCODONE-acetaminophen (PERCOCET/ROXICET) 5-325 MG tablet Take 1-2 tablets by mouth every 6 (six) hours as needed for moderate pain or severe pain.     polyethylene glycol (MIRALAX / GLYCOLAX) 17 g packet Take 17 g by mouth daily.  0   spironolactone (ALDACTONE) 25 MG tablet Take 25 mg by mouth every evening.     traMADol (ULTRAM) 50 MG tablet Take 1 tablet (50 mg total) by mouth every 6 (six) hours.     [START ON 09/02/2021] Vitamin D, Ergocalciferol, (DRISDOL) 1.25 MG (50000 UNIT) CAPS capsule Take 1 capsule (50,000 Units total) by mouth every 7 (seven) days.       Home: Home Living Family/patient expects to be discharged to:: Private residence Living Arrangements: Spouse/significant other Available Help at Discharge: Family, Available 24 hours/day Type of Home: Mobile home Home Access: Stairs to enter [employer] of Steps: 1-2 Entrance Stairs-Rails: None Home Layout: One level Bathroom Shower/Tub: Engineer, manufacturing systems: Handicapped height Bathroom Accessibility: Yes Home Equipment: Grab bars - tub/shower Additional Comments: wife can (A)  Lives With: Spouse   Functional History: Prior Function Prior Level of Function : Independent/Modified Independent, Working/employed, Driving Mobility Comments: pt works Engineer, mining, no falls, no mobility issues   Functional Status:  Mobility: Bed Mobility Overal bed mobility: Needs Assistance Bed Mobility: Supine to Sit Supine to sit: Min assist Sit to supine: Mod assist General bed mobility comments: Cues provided for sequencing and HHA utilized on R as well as side rail on R. Transfers Overall transfer level: Needs assistance Equipment used: Rolling walker (2 wheels) (serving as hemi-walker on right side) Transfers: Sit to/from Stand Sit to Stand: +2 physical assistance,  +2 safety/equipment, Min assist Bed to/from chair/wheelchair/BSC transfer type:: Via Lift equipment  Lateral/Scoot Transfers: Mod assist, From elevated surface Transfer via Lift Equipment: Stedy General transfer comment: Pt utilized RW with R UE to arise to standing position. Pt performed pre-gait weight shifts side to side and was able to progress to lifting R LE off floor several times although L knee was blocked.  Pt sat down at EOB after fatiguing but then again performed sit to stand for side steps at side of bed. Ambulation/Gait Ambulation/Gait assistance: Mod assist, +2 physical assistance, +2 safety/equipment Gait Distance (Feet): 2 Feet Assistive device: Rolling walker (2 wheels) (RW utilized as hemi-walker on R side) Gait Pattern/deviations: Decreased stance time - right, Decreased stance time - left, Decreased weight shift to left, Antalgic General Gait Details: Pt performed side steps while standing next to bed. Therapist assisted pt with moving RW (functioning as hemi-walker) and tech assisted pt by blocking his left knee when right LE was in swing phase.   ADL: ADL Overall ADL's : Needs  assistance/impaired Eating/Feeding: Bed level, Minimal assistance Grooming: Minimal assistance, Sitting, Wash/dry hands, Wash/dry face, Oral care Upper Body Bathing: Maximal assistance Lower Body Bathing: Total assistance Upper Body Dressing : Maximal assistance, Bed level Lower Body Dressing: Total assistance General ADL Comments: pt return back to bed just prior to arrival. pt reports sitting up for over one hour and half. pt feeling hot on arrival but agreeable to starting ROM of L UE. Pt provided 4 new batteries for portal hospital fans in the room   Cognition: Cognition Overall Cognitive Status: Within Functional Limits for tasks assessed Orientation Level: Oriented X4 Cognition Arousal/Alertness: Awake/alert Behavior During Therapy: WFL for tasks assessed/performed Overall Cognitive  Status: Within Functional Limits for tasks assessed       Blood pressure 110/73, pulse 71, temperature 98 F (36.7 C), temperature source Oral, resp. rate 16, height 6' (1.829 m), weight 128.4 kg, SpO2 92 %. Physical Exam   General: Alert and oriented x 3, No apparent distress HEENT: Head is normocephalic, atraumatic, PERRLA, EOMI, sclera anicteric, oral mucosa pink and moist, dentition intact, ext ear canals clear,  Neck: Supple without JVD or lymphadenopathy Heart: Reg rate and rhythm. No murmurs rubs or gallops Chest: CTA bilaterally without wheezes, rales, or rhonchi; no distress Abdomen: Soft, non-tender, non-distended, bowel sounds positive.obese Extremities: No clubbing, cyanosis, or edema. Pulses are 2+ Psych: Pt's affect is appropriate. Pt is cooperative Skin: Clean and intact without signs of breakdown Neuro: Oriented x4, follows commands and answers questions, alert, sensation intact to light touch in all 4 extremities, speech normal Strength 5 out of 5 in right upper extremity Able to move left upper extremity to gravity and grip with hand Right hip flexion 4 out of 5, distally 5 out of 5 Left hip flexion 2-3 out of 5, distally 4+ out of 5 Musculoskeletal: Tenderness around left upper extremity surgical incision Tenderness around pelvis more on the left, pain with movement at his hips particularly on the left Tone is normal  Results for orders placed or performed during the hospital encounter of 08/31/21 (from the past 48 hour(s))  Glucose, capillary     Status: Abnormal   Collection Time: 08/31/21  4:25 PM  Result Value Ref Range   Glucose-Capillary 118 (H) 70 - 99 mg/dL    Comment: Glucose reference range applies only to samples taken after fasting for at least 8 hours.   No results found.    Blood pressure 110/73, pulse 71, temperature 98 F (36.7 C), temperature source Oral, resp. rate 16, height 6' (1.829 m), weight 128.4 kg, SpO2 92 %.  Medical Problem List  and Plan: 1. Functional deficits secondary to polytrauma with Left distal humerus Fx, Sacral/pubic rami FX  -patient may shower cover surgical incision  -ELOS/Goals: 10-12 supervision PT, supervision to min with OT  -Admit to CIR 2.  Antithrombotics: -DVT/anticoagulation:  Pharmaceutical: transition to Eliquis 2.5 mg BID X 30 day  -antiplatelet therapy: ASA 3. Pain Management: Will add Oxycontin for more consistent pain relief and d/c IV dilaudid  --continue oxycodone prn.   --continue robaxin 4. Mood/Sleep: LCSW to follow for evaluation and support.   -antipsychotic agents: N/A 5. Neuropsych/cognition: This patient  is capable of making decisions on his own behalf. 6. Skin/Wound Care: Routine pressure relief measures. Monitor wound for healing.  7. Fluids/Electrolytes/Nutrition: Monitor I/O. Check CMET on Monday 8. Left distal humerus Fx s/p ORIF on 08/25/21: NWB LUE. Follow up X rays in 2 weeks --Sling for comfort.  9. CAD w/systolic CHF:  EF 40-45%. Monitor for signs of overload. Check wts daily.  --Monitor for symptoms with increase in activity.   --continue ASA, Coreg, Lipitor, aldactone and Zestril  -Heart healthy diet 10. New diagnosis T2DM: Hgb A1C- 7.5.  --WIll start monitoring BS ac/hs and use SSI for elevated BS  --Will likely need oral agent for better control and to promote wound healing.  11. Sacral FX: WBAT 12. Acute blood loss anemia: Monitor for signs of bleeding. HGB 11.8  --recheck CBC on Monday.  13. Morbid obesity-BMI 44: Encourage appropriate diet and activity for wt loss to promote mobility and overall  health.  14. Vitamin D deficiency:  On Ergocalciferol for supplement.  15. Constipation on colace and miralax 16.HLD on Lipitor 17 Acute hypoxic respiratory failure. Resolved. Due to CHF. Had lasix IV x2   I have personally performed a face to face diagnostic evaluation of this patient and formulated the key components of the plan.  Additionally, I have  personally reviewed laboratory data, imaging studies, as well as relevant notes and concur with the physician assistant's documentation above.  The patient's status has not changed from the original H&P.  Any changes in documentation from the acute care chart have been noted above.  Jennye Boroughs, MD, [facility]    Bary Leriche, PA-C 08/30/2021   Jennye Boroughs, MD 08/31/2021

## 2021-09-01 ENCOUNTER — Encounter (HOSPITAL_COMMUNITY): Payer: Self-pay | Admitting: Physical Medicine & Rehabilitation

## 2021-09-01 ENCOUNTER — Other Ambulatory Visit: Payer: Self-pay

## 2021-09-01 LAB — CBC WITH DIFFERENTIAL/PLATELET
Abs Immature Granulocytes: 0.03 10*3/uL (ref 0.00–0.07)
Basophils Absolute: 0.1 10*3/uL (ref 0.0–0.1)
Basophils Relative: 1 %
Eosinophils Absolute: 0.4 10*3/uL (ref 0.0–0.5)
Eosinophils Relative: 4 %
HCT: 37.4 % — ABNORMAL LOW (ref 39.0–52.0)
Hemoglobin: 12.5 g/dL — ABNORMAL LOW (ref 13.0–17.0)
Immature Granulocytes: 0 %
Lymphocytes Relative: 11 %
Lymphs Abs: 1.1 10*3/uL (ref 0.7–4.0)
MCH: 31.6 pg (ref 26.0–34.0)
MCHC: 33.4 g/dL (ref 30.0–36.0)
MCV: 94.7 fL (ref 80.0–100.0)
Monocytes Absolute: 1.1 10*3/uL — ABNORMAL HIGH (ref 0.1–1.0)
Monocytes Relative: 11 %
Neutro Abs: 7.5 10*3/uL (ref 1.7–7.7)
Neutrophils Relative %: 73 %
Platelets: 312 10*3/uL (ref 150–400)
RBC: 3.95 MIL/uL — ABNORMAL LOW (ref 4.22–5.81)
RDW: 12.1 % (ref 11.5–15.5)
WBC: 10.2 10*3/uL (ref 4.0–10.5)
nRBC: 0 % (ref 0.0–0.2)

## 2021-09-01 LAB — COMPREHENSIVE METABOLIC PANEL
ALT: 67 U/L — ABNORMAL HIGH (ref 0–44)
AST: 53 U/L — ABNORMAL HIGH (ref 15–41)
Albumin: 2.9 g/dL — ABNORMAL LOW (ref 3.5–5.0)
Alkaline Phosphatase: 81 U/L (ref 38–126)
Anion gap: 10 (ref 5–15)
BUN: 20 mg/dL (ref 8–23)
CO2: 26 mmol/L (ref 22–32)
Calcium: 9 mg/dL (ref 8.9–10.3)
Chloride: 103 mmol/L (ref 98–111)
Creatinine, Ser: 0.67 mg/dL (ref 0.61–1.24)
GFR, Estimated: >60 mL/min (ref >60)
Glucose, Bld: 129 mg/dL — ABNORMAL HIGH (ref 70–99)
Potassium: 4.3 mmol/L (ref 3.5–5.1)
Sodium: 139 mmol/L (ref 135–145)
Total Bilirubin: 1.1 mg/dL (ref 0.3–1.2)
Total Protein: 6.3 g/dL — ABNORMAL LOW (ref 6.5–8.1)

## 2021-09-01 LAB — GLUCOSE, CAPILLARY
Glucose-Capillary: 134 mg/dL — ABNORMAL HIGH (ref 70–99)
Glucose-Capillary: 113 mg/dL — ABNORMAL HIGH (ref 70–99)
Glucose-Capillary: 106 mg/dL — ABNORMAL HIGH (ref 70–99)
Glucose-Capillary: 112 mg/dL — ABNORMAL HIGH (ref 70–99)

## 2021-09-01 MED ORDER — POLYETHYLENE GLYCOL 3350 17 G PO PACK
17.0000 g | PACK | Freq: Every day | ORAL | Status: DC
  Administered 2021-09-01 – 2021-09-02 (×2): 17 g via ORAL
  Filled 2021-09-01 (×2): qty 1

## 2021-09-01 MED ORDER — MAGNESIUM HYDROXIDE 400 MG/5ML PO SUSP
45.0000 mL | Freq: Once | ORAL | Status: AC
  Administered 2021-09-01: 45 mL via ORAL
  Filled 2021-09-01: qty 60

## 2021-09-01 MED ORDER — SENNOSIDES-DOCUSATE SODIUM 8.6-50 MG PO TABS
2.0000 | ORAL_TABLET | Freq: Two times a day (BID) | ORAL | Status: DC
  Administered 2021-09-01 – 2021-09-12 (×23): 2 via ORAL
  Filled 2021-09-01 (×23): qty 2

## 2021-09-01 MED ORDER — TRAMADOL HCL 50 MG PO TABS
100.0000 mg | ORAL_TABLET | Freq: Four times a day (QID) | ORAL | Status: DC
  Administered 2021-09-01 – 2021-09-11 (×40): 100 mg via ORAL
  Filled 2021-09-01 (×40): qty 2

## 2021-09-01 MED ORDER — ADULT MULTIVITAMIN W/MINERALS CH
1.0000 | ORAL_TABLET | Freq: Every day | ORAL | Status: DC
  Administered 2021-09-01 – 2021-09-12 (×12): 1 via ORAL
  Filled 2021-09-01 (×12): qty 1

## 2021-09-01 MED ORDER — ENSURE ENLIVE PO LIQD
237.0000 mL | Freq: Two times a day (BID) | ORAL | Status: DC
Start: 2021-09-02 — End: 2021-09-04

## 2021-09-01 MED ORDER — POLYETHYLENE GLYCOL 3350 17 G PO PACK
17.0000 g | PACK | Freq: Two times a day (BID) | ORAL | Status: DC
  Filled 2021-09-01: qty 1

## 2021-09-01 NOTE — Progress Notes (Signed)
Patient ID: CAMBRIDGE DELEO, male   DOB: June 05, 1960, 61 y.o.   MRN: [id number]  SW spoke with pt CM WC Quentin Angst [phone] to discuss patient care needs. States to give her updates regularly as available. Will visit patient daily. SW will provide updates from team conference.  Cecile Sheerer, MSW, LCSWA Office: [phone] Cell: [phone] Fax: [phone]

## 2021-09-01 NOTE — Evaluation (Signed)
Physical Therapy Assessment and Plan  Patient Details  Name: Mathew Oliver MRN: [id number] Date of Birth: 06/17/1960  PT Diagnosis: Abnormality of gait, Difficulty walking, Edema, Muscle weakness, and Pain in joint Rehab Potential: Good ELOS: 1.5-2 weeks   Today's Date: 09/01/2021 PT Individual Time: 1005-1105 PT Individual Time Calculation (min): 60 min    Hospital Problem: Principal Problem:   Trauma   Past Medical History:  Past Medical History:  Diagnosis Date   Anxiety    Chronic systolic (congestive) heart failure ([place])    Depression    Heart disease    LAD occlsuion   High blood pressure    MI, acute, non ST segment elevation ([place])    Past Surgical History:  Past Surgical History:  Procedure Laterality Date   CARDIAC CATHETERIZATION     PTCA- LAD   ORIF HUMERUS FRACTURE Left 08/25/2021   Procedure: OPEN REDUCTION INTERNAL FIXATION (ORIF) DISTAL HUMERUS FRACTURE;  Surgeon: Shona Needles, MD;  Location: [facility];  Service: Orthopedics;  Laterality: Left;    Assessment & Plan Clinical Impression: Patient is a 61 y.o. year old male with history of CAD, systolic heart failure, HTN, hyperlipidemia; who was admitted on 08/23/2021 after tripping and falling from a 25 foot ladder at work.  He was noted to be mildly tachypneic at admission and had leukocytosis.  He was placed on oxygen per nasal cannula and work-up revealed left superior and inferior pubic rami and left sacrum fractures, complex left distal humerus fracture which was placed in long-arm splint. Dr. Doreatha Martin was complete consulted due to complexity of injuries and recommended WBAT BLE and surgical fixation due to significance of injury.  He was taken to the OR on 08/25/2020 for ORIF left distal humerus with primary repair of partial triceps tendon tear, ulna osteotomy as well as I&D of left open distal humerus fracture.   He is to be NWB LUE with unrestricted range of motion at left elbow as well as Lovenox for DVT  prophylaxis with transition to [place].   He was treated with IV fluids for hydration however developed acute hypoxic respiratory failure requiring IV diuresis respiratory status has improved and he was weaned off oxygen.  Reactive leukocytosis has resolved and pain control slowly improving.  PT OT has been working with patient and he continued to be limited by fatigue, weakness, weightbearing restrictions and difficulty weightbearing through BLE.  Patient reports his most significant pain is at his pelvis and of his legs.  Patient has had a decline in functional status and CIR was recommended for follow-up therapy. Patient transferred to CIR on 08/31/2021 .   Patient currently requires mod with mobility secondary to muscle weakness and muscle joint tightness, decreased cardiorespiratoy endurance, and decreased sitting balance, decreased standing balance, decreased postural control, and decreased balance strategies.  Prior to hospitalization, patient was independent  with mobility and lived with Spouse in a Mobile home home.  Home access is 1-2Stairs to enter.  Patient will benefit from skilled PT intervention to maximize safe functional mobility, minimize fall risk, and decrease caregiver burden for planned discharge home with intermittent assist.  Anticipate patient will benefit from follow up [facility] at discharge.  PT - End of Session Activity Tolerance: Tolerates 30+ min activity with multiple rests Endurance Deficit: Yes Endurance Deficit Description: required frequent breaks with minimal activity PT Assessment Rehab Potential (ACUTE/IP ONLY): Good PT Barriers to Discharge: Inaccessible home environment;Decreased caregiver support;Weight bearing restrictions PT Patient demonstrates impairments in the following area(s): Balance;Pain;Perception;Edema;Safety;Endurance;Motor;Skin  Integrity;Nutrition PT Transfers Functional Problem(s): Bed Mobility;Bed to Chair;Car;Furniture PT Locomotion Functional  Problem(s): Ambulation;Wheelchair Mobility;Stairs PT Plan PT Intensity: Minimum of 1-2 x/day ,45 to 90 minutes PT Frequency: 5 out of 7 days PT Duration Estimated Length of Stay: 1.5-2 weeks PT Treatment/Interventions: Ambulation/gait training;Discharge planning;DME/adaptive equipment instruction;Functional mobility training;Pain management;Splinting/orthotics;Psychosocial support;Therapeutic Activities;UE/LE Strength taining/ROM;Wheelchair propulsion/positioning;UE/LE Coordination activities;Therapeutic Exercise;Stair training;Skin care/wound management;Patient/family education;Neuromuscular re-education;Functional electrical stimulation;Disease management/prevention;Community reintegration;Balance/vestibular training PT Transfers Anticipated Outcome(s): supervision using LRAD PT Locomotion Anticipated Outcome(s): mod I w/c level, CGA gait up to 25 feet using LRAD PT Recommendation Follow Up Recommendations: Home health PT Patient destination: Home Equipment Recommended: To be determined;Wheelchair (measurements);Wheelchair cushion (measurements) Equipment Details: 22"x18" manual w/c with B ELRs, 22"x18" foam cushion   PT Evaluation Precautions/Restrictions Precautions Precautions: Fall Required Braces or Orthoses: Sling (L upper extremity) Restrictions Weight Bearing Restrictions: Yes LUE Weight Bearing: Non weight bearing LLE Weight Bearing: Weight bearing as tolerated Other Position/Activity Restrictions: Sling was not on when OT entered the room. Pt supine in bed. Pain Interference Pain Interference Pain Effect on Sleep: 2. Occasionally Pain Interference with Therapy Activities: 3. Frequently Pain Interference with Day-to-Day Activities: 2. Occasionally Home Living/Prior Functioning Home Living Available Help at Discharge: Family;Available 24 hours/day Type of Home: Mobile home Home Access: Stairs to enter Entrance Stairs-Number of Steps: 1-2 Entrance Stairs-Rails: None Home  Layout: One level Bathroom Shower/Tub: Chiropodist: Handicapped height Bathroom Accessibility: Yes Additional Comments: wife can assist at home  Lives With: Spouse Prior Function Level of Independence: Independent with basic ADLs;Independent with homemaking with ambulation;Independent with gait;Independent with transfers  Able to Take Stairs?: Yes Driving: Yes Vocation: Full time employment Leisure: Hobbies-yes (Comment) (enjoys cooking but no others reported) Vision/Perception  Vision - History Ability to See in Adequate Light: 0 Adequate Perception Perception: Within Functional Limits Praxis Praxis: Intact  Cognition Overall Cognitive Status: Within Functional Limits for tasks assessed Arousal/Alertness: Awake/alert Attention: Sustained Sustained Attention: Appears intact Memory: Appears intact Awareness: Appears intact Problem Solving: Appears intact Safety/Judgment: Appears intact Sensation Sensation Light Touch: Appears Intact Hot/Cold: Appears Intact Proprioception: Appears Intact Coordination Gross Motor Movements are Fluid and Coordinated: No Fine Motor Movements are Fluid and Coordinated: No Coordination and Movement Description: Gross motor limited by NWB LUE and pain from pelvic fx Finger Nose Finger Test: intact Motor  Motor Motor: Abnormal postural alignment and control;Other (comment) Motor - Skilled Clinical Observations: overall impared d/t LUE/LLE fxs  Trunk/Postural Assessment  Cervical Assessment Cervical Assessment: Within Functional Limits Thoracic Assessment Thoracic Assessment: Within Functional Limits Lumbar Assessment Lumbar Assessment: Within Functional Limits Postural Control Postural Control: Deficits on evaluation Postural Limitations: limited by injury to L hip/UE  Balance Balance Balance Assessed: Yes Static Sitting Balance Static Sitting - Balance Support: Feet supported;No upper extremity supported Static  Sitting - Level of Assistance: 5: Stand by assistance Dynamic Sitting Balance Dynamic Sitting - Balance Support: Feet supported;No upper extremity supported Dynamic Sitting - Level of Assistance: 5: Stand by assistance Dynamic Sitting - Balance Activities: Forward lean/weight shifting;Reaching for objects Sitting balance - Comments: pt able to wash body while seated and no extremity supported Static Standing Balance Static Standing - Balance Support: During functional activity;Right upper extremity supported Static Standing - Level of Assistance: 4: Min assist Dynamic Standing Balance Dynamic Standing - Balance Support: During functional activity Dynamic Standing - Level of Assistance: 3: Mod assist Dynamic Standing - Balance Activities: Reaching for objects;Reaching across midline Extremity Assessment  RLE Assessment RLE Assessment: Within Functional Limits Active Range of Motion (  AROM) Comments: WFL for all functional activity General Strength Comments: Grossly 4+ to 5/5 throughout in sitting LLE Assessment LLE Assessment: Exceptions to [facility] Active Range of Motion (AROM) Comments: ankle and knee WFL, hip flexion limited ~100 deg, limited to no IR due to pain General Strength Comments: Grossly 2 to3/5 hip and knee and 5/5 DF/PF in sitting, limited by pain  Care Tool Care Tool Bed Mobility Roll left and right activity   Roll left and right assist level: Moderate Assistance - Patient 50 - 74%    Sit to lying activity   Sit to lying assist level: Moderate Assistance - Patient 50 - 74%    Lying to sitting on side of bed activity   Lying to sitting on side of bed assist level: the ability to move from lying on the back to sitting on the side of the bed with no back support.: Moderate Assistance - Patient 50 - 74%     Care Tool Transfers Sit to stand transfer   Sit to stand assist level: Moderate Assistance - Patient 50 - 74% Sit to stand assistive device: Other (// bar on R only)   Chair/bed transfer   Chair/bed transfer assist level: Moderate Assistance - Patient 50 - 74%     Physiological scientist transfer assist level: Minimal Assistance - Patient > 75%      Care Tool Locomotion Ambulation   Assist level: Moderate Assistance - Patient 50 - 74% Assistive device: Parallel bars Max distance: 2 ft  Walk 10 feet activity Walk 10 feet activity did not occur: Safety/medical concerns       Walk 50 feet with 2 turns activity Walk 50 feet with 2 turns activity did not occur: Safety/medical concerns      Walk 150 feet activity Walk 150 feet activity did not occur: Safety/medical concerns      Walk 10 feet on uneven surfaces activity Walk 10 feet on uneven surfaces activity did not occur: Safety/medical concerns      Stairs Stair activity did not occur: Safety/medical concerns        Walk up/down 1 step activity Walk up/down 1 step or curb (drop down) activity did not occur: Safety/medical concerns      Walk up/down 4 steps activity Walk up/down 4 steps activity did not occur: Safety/medical concerns      Walk up/down 12 steps activity Walk up/down 12 steps activity did not occur: Safety/medical concerns      Pick up small objects from floor Pick up small object from the floor (from standing position) activity did not occur: Safety/medical concerns      Wheelchair Is the patient using a wheelchair?: Yes Type of Wheelchair: Manual   Wheelchair assist level: Supervision/Verbal cueing Max wheelchair distance: 15 ft  Wheel 50 feet with 2 turns activity   Assist Level: Total Assistance - Patient < 25%  Wheel 150 feet activity   Assist Level: Total Assistance - Patient < 25%    Refer to Care Plan for Long Term Goals  SHORT TERM GOAL WEEK 1 PT Short Term Goal 1 (Week 1): Patient will perform bed mobility with min A using home set-up. PT Short Term Goal 2 (Week 1): Patient will perform basic transfers with CGA using LRAD. PT Short  Term Goal 3 (Week 1): Patient will ambulate >10 feet using LRAD. PT Short Term Goal 4 (Week 1): Patient will propel w/c >50 feet.  Recommendations for  other services: None   Skilled Therapeutic Intervention Evaluation completed (see details above and below) with education on PT POC and goals and individual treatment initiated with focus on functional mobility/transfers, LE strength, dynamic standing balance/coordination, ambulation, stair navigation, simulated car transfers, and improved endurance with activity Patient provided with 22"x18" wheelchair with Roho cushion and B ELRs and adjustments made to promote optimal seating posture and pressure distribution. Patient also provided with hemi walker by OT for use in room and therapist adjusted to proper height for patient.  Patient in w/c reporting significant discomfort upon PT arrival. Patient alert and agreeable to PT session. Patient reported 7-8/10 pelvic pain during session, RN made aware. PT provided repositioning, rest breaks, and distraction as pain interventions throughout session. Patient reports that he does not like to take the oxycodone or robaxin due to feeling "loopy and tired," MD made aware. Patient received tylenol from LPN during session.   Patient with improved comfort following a lying rest break in bed and trial of a larger w/c with Roho cushion, see above, provided during session.   Therapeutic Activity: Bed Mobility: Patient performed rolling R with min A and supine to/from sit with mod A for trunk and lower extremity management. Provided verbal cues for use of bottom elbow to push up to sitting and bringing knees to chest to lift his legs onto the bed. Transfers: Patient performed lateral scoot w/c>bed and bed<>w/c with mod progressing to min A. Provided cues for head-hips relationship, hand placement, and lifting his hips for clearance. He performed sit to/from stand x2 with min-mod A in the // bars using his R hand only.  Provided verbal cues for scooting forward, forward weight shift, and gluteal activation for hip/trunk extension. Patient performed standing balance x1 with min A focused on erect posture and diaphragmatic breathing for pain control >60 sec.   Gait Training:  Patient ambulated 2 feet using R // bar with min A. Ambulated with very limited step height and length, decreased weight shift and stance time on L with antalgic gait on L. Provided verbal cues for weight shift and sequencing. Limited distance due to elevated pain levels and patient reporting sudden onset of feeling "light headed."  Wheelchair Mobility:  Patient propelled wheelchair 15 feet with supervision using B lower extremities and his R upper extremity, limited distance due to pain and poor reach with non-skid socks, requested patient have family bring shoes for improved mobility.   Instructed pt in results of PT evaluation as detailed above, PT POC, rehab potential, rehab goals, and discharge recommendations. Additionally discussed CIR's policies regarding fall safety and use of chair alarm and/or quick release belt. Pt verbalized understanding and in agreement. Will update pt's family members as they become available.   Patient in bed at end of session with breaks locked, bed alarm set, and all needs within reach.     Discharge Criteria: Patient will be discharged from PT if patient refuses treatment 3 consecutive times without medical reason, if treatment goals not met, if there is a change in medical status, if patient makes no progress towards goals or if patient is discharged from hospital.  The above assessment, treatment plan, treatment alternatives and goals were discussed and mutually agreed upon: by patient  Doreene Burke PT, DPT  09/01/2021, 12:50 PM

## 2021-09-01 NOTE — Progress Notes (Signed)
Occupational Therapy Session Note  Patient Details  Name: Mathew Oliver MRN: [id number] Date of Birth: June 15, 1960  Today's Date: 09/01/2021 OT Individual Time: 7989-2119 OT Individual Time Calculation (min): 45 min  and Today's Date: 09/01/2021 OT Missed Time: 30 Minutes Missed Time Reason: Pain;Patient fatigue  Short Term Goals: Week 1:  OT Short Term Goal 1 (Week 1): Pt will complete LB dressing with Mod A. OT Short Term Goal 2 (Week 1): Pt will complete toilet transfer with Min A. OT Short Term Goal 3 (Week 1): Pt will complete functional mobility with LRAD with Min A to increase independence in ADLs. OT Short Term Goal 4 (Week 1): Pt will complete UB/LB bathing with Min A.  Skilled Therapeutic Interventions/Progress Updates:  Pt received supine in bed with c/o of pain and uninterested in participating in OT session. Reported feeling nauseous earlier after moving around. Pt c/o of not using the restroom and unwilling to eat lunch d/t discomfort. Pt agreeable to participate in bed level activities. Completed 3 sets of 10x elbow extension/flexion, forearm supination/pronation, wrist extension/flexion, and finger opposition in gravity eliminated position. Exercises done laying supine in bed with LUE supported by a pillow. Pt reporting pain in LUE and limited ROM with elbow flexion and supination. Pt unwilling to attempt any LE exercise in bed. Education provided on bed positioning to manage and prevent sores and adjusting positioning throughout the day. Lowered HOB to promote hip extension and pillows placed below RLE to provide pressure relief. Instructed pt to remain in this position for 30+ or as long as tolerated. Pt left supine in bed, with bed alarm on, and all needs within reach. 30 minutes of skilled therapy missed d/t patient fatigue/pain.   Therapy Documentation Precautions:  Precautions Precautions: Fall Required Braces or Orthoses: Sling (L upper extremity) Restrictions Weight  Bearing Restrictions: Yes LUE Weight Bearing: Non weight bearing LLE Weight Bearing: Weight bearing as tolerated Other Position/Activity Restrictions: Sling was not on when OT entered the room. Pt supine in bed.   Therapy/Group: Individual Therapy  Khyren Hing 09/01/2021, 1:25 PM

## 2021-09-01 NOTE — Progress Notes (Signed)
PROGRESS NOTE   Subjective/Complaints:  Pt had pain during PT but only took tylenol since oxycodone causes drowsiness  Spoke of alternative meds, has never tried tramadol Pt also states no BM x 8 d with some reflux type symptoms but no abd pain   ROS - Neg CP, SOB, N/V/D  Objective:   No results found. Recent Labs    09/01/21 0555  WBC 10.2  HGB 12.5*  HCT 37.4*  PLT 312   Recent Labs    09/01/21 0555  NA 139  K 4.3  CL 103  CO2 26  GLUCOSE 129*  BUN 20  CREATININE 0.67  CALCIUM 9.0    Intake/Output Summary (Last 24 hours) at 09/01/2021 1059 Last data filed at 09/01/2021 1032 Gross per 24 hour  Intake 360 ml  Output 1250 ml  Net -890 ml        Physical Exam: Vital Signs Blood pressure 130/86, pulse 69, temperature 97.9 F (36.6 C), temperature source Oral, resp. rate 18, height 6' (1.829 m), weight 129.1 kg, SpO2 95 %.   General: No acute distress Mood and affect are appropriate Heart: Regular rate and rhythm no rubs murmurs or extra sounds Lungs: Clear to auscultation, breathing unlabored, no rales or wheezes Abdomen: Positive bowel sounds, soft nontender to palpation, nondistended Extremities: No clubbing, cyanosis, or edema Skin: No evidence of breakdown, no evidence of rash Neurologic: Cranial nerves II through XII intact, motor strength is 5/5 in RIght  deltoid, bicep, tricep, grip, Left grip 4/5 LUE not tested due to fracture, 3- hip flexor, knee extensors, 4/5 ankle dorsiflexor and plantar flexor (limited by Hip pain)   Musculoskeletal: limited range LUE and LEs due to pain /fractures    Assessment/Plan: 1. Functional deficits which require 3+ hours per day of interdisciplinary therapy in a comprehensive inpatient rehab setting. Physiatrist is providing close team supervision and 24 hour management of active medical problems listed below. Physiatrist and rehab team continue to assess  barriers to discharge/monitor patient progress toward functional and medical goals  Care Tool:  Bathing    Body parts bathed by patient: Right arm, Left arm, Chest, Abdomen, Front perineal area, Right upper leg, Left upper leg, Face     Body parts n/a: Buttocks, Right lower leg, Left lower leg   Bathing assist Assist Level: Moderate Assistance - Patient 50 - 74%     Upper Body Dressing/Undressing Upper body dressing   What is the patient wearing?: Hospital gown only, Pull over shirt    Upper body assist Assist Level: Contact Guard/Touching assist    Lower Body Dressing/Undressing Lower body dressing      What is the patient wearing?: Pants     Lower body assist Assist for lower body dressing: Maximal Assistance - Patient 25 - 49%     Toileting Toileting    Toileting assist Assist for toileting: Maximal Assistance - Patient 25 - 49% Assistive Device Comment: urinal   Transfers Chair/bed transfer  Transfers assist     Chair/bed transfer assist level: Moderate Assistance - Patient 50 - 74%     Locomotion Ambulation   Ambulation assist  Walk 10 feet activity   Assist           Walk 50 feet activity   Assist           Walk 150 feet activity   Assist           Walk 10 feet on uneven surface  activity   Assist           Wheelchair     Assist               Wheelchair 50 feet with 2 turns activity    Assist            Wheelchair 150 feet activity     Assist          Blood pressure 130/86, pulse 69, temperature 97.9 F (36.6 C), temperature source Oral, resp. rate 18, height 6' (1.829 m), weight 129.1 kg, SpO2 95 %.  Medical Problem List and Plan: 1. Functional deficits secondary to polytrauma with Left distal humerus Fx, Sacral/pubic rami FX             -patient may shower cover surgical incision             -ELOS/Goals: 10-12 supervision PT, supervision to min with OT              -Admit to CIR 2.  Antithrombotics: -DVT/anticoagulation:  Pharmaceutical: transition to Eliquis 2.5 mg BID X 30 day             -antiplatelet therapy: ASA 3. Pain Management: Will add Oxycontin for more consistent pain relief and d/c IV dilaudid           --continue oxycodone prn.              --continue robaxin 4. Mood/Sleep: LCSW to follow for evaluation and support.              -antipsychotic agents: N/A 5. Neuropsych/cognition: This patient  is capable of making decisions on his own behalf. 6. Skin/Wound Care: Routine pressure relief measures. Monitor wound for healing.  7. Fluids/Electrolytes/Nutrition: Monitor I/O. Check CMET on Monday 8. Left distal humerus Fx s/p ORIF on 08/25/21: NWB LUE. Follow up X rays in 2 weeks --Sling for comfort.  9. CAD w/systolic CHF: EF 29-52%. Monitor for signs of overload. Check wts daily.  --Monitor for symptoms with increase in activity.              --continue ASA, Coreg, Lipitor, aldactone and Zestril             -Heart healthy diet 10. New diagnosis T2DM: Hgb A1C- 7.5.  --WIll start monitoring BS ac/hs and use SSI for elevated BS            CBG (last 3)  Recent Labs    08/31/21 1625 08/31/21 2128 09/01/21 0621  GLUCAP 118* 116* 134*    11. Sacral FX: WBAT 12. Acute blood loss anemia: Monitor for signs of bleeding. HGB 11.8             --recheck CBC on Monday.  13. Morbid obesity-BMI 44: Encourage appropriate diet and activity for wt loss to promote mobility and overall  health.  14. Vitamin D deficiency:  On Ergocalciferol for supplement.  15. Constipation on colace and miralax 16.HLD on Lipitor 17 Acute hypoxic respiratory failure. Resolved. Due to CHF. Had lasix IV x2  18.  HTN Vitals:   08/31/21 1919 09/01/21 0527  BP: 110/73 130/86  Pulse: 71  69  Resp: 16 18  Temp: 98 F (36.7 C) 97.9 F (36.6 C)  SpO2: 92% 95%   Cont Coreg, Lisinopril, aldactone-6/19  LOS: 1 days A FACE TO FACE EVALUATION WAS PERFORMED  Erick Colace 09/01/2021, 10:59 AM

## 2021-09-01 NOTE — Progress Notes (Signed)
Occupational Therapy Assessment and Plan  Patient Details  Name: Mathew Oliver MRN: [id number] Date of Birth: 1961-01-01  OT Diagnosis: acute pain, muscle weakness (generalized), and pain in joint Rehab Potential: Rehab Potential (ACUTE ONLY): Good ELOS: 10-14 days   Today's Date: 09/01/2021 OT Individual Time: 0902-1000 OT Individual Time Calculation (min): 58 min     Hospital Problem: Principal Problem:   Trauma   Past Medical History:  Past Medical History:  Diagnosis Date   Anxiety    Chronic systolic (congestive) heart failure (HCC)    Depression    Heart disease    LAD occlsuion   High blood pressure    MI, acute, non ST segment elevation ([place])    Past Surgical History:  Past Surgical History:  Procedure Laterality Date   CARDIAC CATHETERIZATION     PTCA- LAD   ORIF HUMERUS FRACTURE Left 08/25/2021   Procedure: OPEN REDUCTION INTERNAL FIXATION (ORIF) DISTAL HUMERUS FRACTURE;  Surgeon: Shona Needles, MD;  Location: [place];  Service: Orthopedics;  Laterality: Left;    Assessment & Plan Clinical Impression:  HPI: Mathew Oliver is a 61 year old male with history of CAD, systolic heart failure, HTN, hyperlipidemia; who was admitted on 08/23/2021 after tripping and falling from a 25 foot ladder at work.  He was noted to be mildly tachypneic at admission and had leukocytosis.  He was placed on oxygen per nasal cannula and work-up revealed left superior and inferior pubic rami and left sacrum fractures, complex left distal humerus fracture which was placed in long-arm splint. Dr. Doreatha Martin was complete consulted due to complexity of injuries and recommended WBAT BLE and surgical fixation due to significance of injury.  He was taken to the OR on 08/25/2020 for ORIF left distal humerus with primary repair of partial triceps tendon tear, ulna osteotomy as well as I&D of left open distal humerus fracture.   He is to be NWB LUE with unrestricted range of motion at left elbow as  well as Lovenox for DVT prophylaxis with transition to [place].   He was treated with IV fluids for hydration however developed acute hypoxic respiratory failure requiring IV diuresis respiratory status has improved and he was weaned off oxygen.  Reactive leukocytosis has resolved and pain control slowly improving.  PT OT has been working with patient and he continued to be limited by fatigue, weakness, weightbearing restrictions and difficulty weightbearing through BLE.  Patient reports his most significant pain is at his pelvis and of his legs.  Patient has had a decline in functional status and CIR was recommended for follow-up therapy. Patient transferred to CIR on 08/31/2021 .    Patient currently requires mod with basic self-care skills secondary to muscle weakness, decreased cardiorespiratoy endurance, and decreased standing balance and decreased postural control.  Prior to hospitalization, patient could complete all ADLs independent .  Patient will benefit from skilled intervention to increase independence with basic self-care skills prior to discharge home with care partner.  Anticipate patient will require intermittent supervision and follow up home health.  OT - End of Session Activity Tolerance: Tolerates 10 - 20 min activity with multiple rests Endurance Deficit: Yes Endurance Deficit Description: required frequent breaks during bathing, dressing, transfers d/t SOB OT Assessment Rehab Potential (ACUTE ONLY): Good OT Barriers to Discharge: Home environment access/layout;Weight bearing restrictions OT Patient demonstrates impairments in the following area(s): Balance;Pain;Cognition;Safety;Endurance;Motor;Skin Integrity OT Basic ADL's Functional Problem(s): Bathing;Dressing;Toileting OT Transfers Functional Problem(s): Toilet;Tub/Shower OT Additional Impairment(s): Fuctional Use of Upper  Extremity OT Plan OT Intensity: Minimum of 1-2 x/day, 45 to 90 minutes OT Frequency: 5 out of 7  days OT Duration/Estimated Length of Stay: 10-14 days OT Treatment/Interventions: Balance/vestibular training;Community reintegration;Disease mangement/prevention;Functional electrical stimulation;Neuromuscular re-education;Patient/family education;Self Care/advanced ADL retraining;Splinting/orthotics;Therapeutic Exercise;UE/LE Coordination activities;Wheelchair propulsion/positioning;UE/LE Strength taining/ROM;Therapeutic Activities;Skin care/wound managment;Psychosocial support;Pain management;Functional mobility training;DME/adaptive equipment instruction;Discharge planning;Cognitive remediation/compensation;Visual/perceptual remediation/compensation OT Self Feeding Anticipated Outcome(s): Independent OT Basic Self-Care Anticipated Outcome(s): Supervision OT Toileting Anticipated Outcome(s): Supervision OT Bathroom Transfers Anticipated Outcome(s): Supervision OT Recommendation Patient destination: Home Follow Up Recommendations: Home health OT Equipment Recommended: To be determined  OT Evaluation Precautions/Restrictions  Precautions Precautions: Fall Required Braces or Orthoses: Sling (L upper extremity) Restrictions Weight Bearing Restrictions: Yes LUE Weight Bearing: Non weight bearing LLE Weight Bearing: Weight bearing as tolerated Other Position/Activity Restrictions: Sling was not on when OT entered the room. Pt supine in bed. General Chart Reviewed: Yes Additional Pertinent History: CAD, systolic heart failure, HTN, hyperlipidemia Family/Caregiver Present: No   Pain Pain Assessment Pain Scale: 0-10 Pain Score: 8  Pain Type: Acute pain;Surgical pain Pain Location: Hip Pain Orientation: Left Pain Descriptors / Indicators: Aching;Discomfort;Sore Pain Onset: On-going Pain Intervention(s): Rest;Emotional support Multiple Pain Sites: No Home Living/Prior Functioning Home Living Family/patient expects to be discharged to:: Private residence Living Arrangements:  Spouse/significant other Available Help at Discharge: Family, Available 24 hours/day Type of Home: Mobile home Home Access: Stairs to enter [employer] of Steps: 1-2 Entrance Stairs-Rails: None Home Layout: One level Bathroom Shower/Tub: Chiropodist: Handicapped height Bathroom Accessibility: Yes Additional Comments: wife can assist at home  Lives With: Spouse IADL History Homemaking Responsibilities: Yes (reported he did these with his wife) Meal Prep Responsibility: Secondary Laundry Responsibility: Secondary Cleaning Responsibility: Secondary Bill Paying/Finance Responsibility: Secondary Shopping Responsibility: Secondary Homemaking Comments: wife did a lot of these Current License: Yes Mode of Transportation: Car Occupation: Full time employment Type of Occupation: maintenance Leisure and Hobbies: cooking on the weekends Prior Function Level of Independence: Independent with basic ADLs, Independent with homemaking with ambulation, Independent with gait, Independent with transfers  Able to Take Stairs?: Yes Driving: Yes Vocation: Full time employment Leisure: Hobbies-yes (Comment) (enjoys cooking but no others reported) Vision Baseline Vision/History: 0 No visual deficits Ability to See in Adequate Light: 0 Adequate Patient Visual Report: No change from baseline Vision Assessment?: No apparent visual deficits Perception  Perception: Within Functional Limits Praxis Praxis: Intact Cognition Cognition Overall Cognitive Status: Within Functional Limits for tasks assessed Arousal/Alertness: Awake/alert Orientation Level: Person;Place;Situation Person: Oriented Place: Oriented Situation: Oriented Memory: Appears intact Attention: Sustained Sustained Attention: Appears intact Awareness: Appears intact Problem Solving: Appears intact Safety/Judgment: Appears intact Brief Interview for Mental Status (BIMS) Repetition of Three Words  (First Attempt): 3 Temporal Orientation: Year: Correct Temporal Orientation: Month: Accurate within 5 days Temporal Orientation: Day: Correct Recall: "Sock": Yes, no cue required Recall: "Blue": Yes, no cue required Recall: "Bed": Yes, no cue required BIMS Summary Score: 15 Sensation Sensation Light Touch: Appears Intact Hot/Cold: Appears Intact Proprioception: Appears Intact Coordination Gross Motor Movements are Fluid and Coordinated: No Fine Motor Movements are Fluid and Coordinated: No Coordination and Movement Description: Gross motor limited by NWB LUE and pain from pelvic fx Finger Nose Finger Test: intact Motor  Motor Motor: Abnormal postural alignment and control;Other (comment) Motor - Skilled Clinical Observations: overall impared d/t LUE/LLE fxs  Trunk/Postural Assessment  Cervical Assessment Cervical Assessment: Within Functional Limits Thoracic Assessment Thoracic Assessment: Within Functional Limits Lumbar Assessment Lumbar Assessment: Within Functional Limits Postural Control Postural Control:  Deficits on evaluation Postural Limitations: limited by injury to L hip/UE  Balance Balance Balance Assessed: Yes Static Sitting Balance Static Sitting - Balance Support: Feet supported;No upper extremity supported Static Sitting - Level of Assistance: 5: Stand by assistance Dynamic Sitting Balance Dynamic Sitting - Balance Support: Feet supported;No upper extremity supported Dynamic Sitting - Level of Assistance: 5: Stand by assistance Dynamic Sitting - Balance Activities: Forward lean/weight shifting;Reaching for objects Sitting balance - Comments: pt able to wash body while seated and no extremity supported Static Standing Balance Static Standing - Balance Support: During functional activity;Right upper extremity supported Static Standing - Level of Assistance: 4: Min assist Dynamic Standing Balance Dynamic Standing - Balance Support: During functional  activity Dynamic Standing - Level of Assistance: 3: Mod assist Dynamic Standing - Balance Activities: Reaching for objects;Reaching across midline Extremity/Trunk Assessment RUE Assessment RUE Assessment: Within Functional Limits General Strength Comments: 5/5 MMT shoulder flexion and elbow flexion LUE Assessment LUE Assessment: Exceptions to [facility] Active Range of Motion (AROM) Comments: Not assessed General Strength Comments: MMT not tested d/t NWB  Care Tool Care Tool Self Care Eating   Eating Assist Level: Set up assist    Oral Care    Oral Care Assist Level: Set up assist    Bathing   Body parts bathed by patient: Right arm;Left arm;Chest;Abdomen;Front perineal area;Right upper leg;Left upper leg;Face   Body parts n/a: Buttocks;Right lower leg;Left lower leg Assist Level: Moderate Assistance - Patient 50 - 74%    Upper Body Dressing(including orthotics)   What is the patient wearing?: Hospital gown only;Pull over shirt   Assist Level: Contact Guard/Touching assist    Lower Body Dressing (excluding footwear)   What is the patient wearing?: Pants Assist for lower body dressing: Maximal Assistance - Patient 25 - 49%    Putting on/Taking off footwear   What is the patient wearing?: Non-skid slipper socks Assist for footwear: Total Assistance - Patient < 25%       Care Tool Toileting Toileting activity   Assist for toileting: Maximal Assistance - Patient 25 - 49%     Care Tool Bed Mobility Roll left and right activity   Roll left and right assist level: Moderate Assistance - Patient 50 - 74%    Sit to lying activity   Sit to lying assist level: Moderate Assistance - Patient 50 - 74%    Lying to sitting on side of bed activity   Lying to sitting on side of bed assist level: the ability to move from lying on the back to sitting on the side of the bed with no back support.: Moderate Assistance - Patient 50 - 74%     Care Tool Transfers Sit to stand transfer   Sit  to stand assist level: Moderate Assistance - Patient 50 - 74% Sit to stand assistive device: Bedrails;Walker (hemi walker utilized once standing)  Chair/bed transfer   Chair/bed transfer assist level: Moderate Assistance - Patient 50 - 74%     Toilet transfer         Care Tool Cognition  Expression of Ideas and Wants Expression of Ideas and Wants: 4. Without difficulty (complex and basic) - expresses complex messages without difficulty and with speech that is clear and easy to understand  Understanding Verbal and Non-Verbal Content Understanding Verbal and Non-Verbal Content: 4. Understands (complex and basic) - clear comprehension without cues or repetitions   Memory/Recall Ability Memory/Recall Ability : Current season;Staff names and faces;That he or she is in a  hospital/hospital unit   Refer to Care Plan for Long Term Goals  SHORT TERM GOAL WEEK 1 OT Short Term Goal 1 (Week 1): Pt will complete LB dressing with Mod A. OT Short Term Goal 2 (Week 1): Pt will complete toilet transfer with Min A. OT Short Term Goal 3 (Week 1): Pt will complete functional mobility with LRAD with Min A to increase independence in ADLs. OT Short Term Goal 4 (Week 1): Pt will complete UB/LB bathing with Min A.  Recommendations for other services: None    Skilled Therapeutic Intervention  Skilled OT evaluation completed with the creation of pt centered OT POC. Pt educated on condition, ELOS, rehab expectations, and fall risk reduction strategies throughout session. Pt received supine in bed and agreeable to OT tx. Session focused on evaluation of CLOF as mentioned below in ADL section. Overall Mod A d/t pain and balance deficits during transfers. Pt left sitting in w/c with chair alarm on, call bell in reach, and all needs met.   ADL ADL Eating: Supervision/safety Where Assessed-Eating: Edge of bed Grooming: Supervision/safety Where Assessed-Grooming: Sitting at sink Upper Body Bathing: Moderate  assistance Where Assessed-Upper Body Bathing: Edge of bed Lower Body Bathing: Maximal assistance Where Assessed-Lower Body Bathing: Edge of bed Upper Body Dressing: Minimal assistance Where Assessed-Upper Body Dressing: Edge of bed Lower Body Dressing: Maximal assistance Where Assessed-Lower Body Dressing: Edge of bed Toileting: Maximal assistance Toilet Transfer: Moderate assistance Mobility  Bed Mobility Bed Mobility: Rolling Right;Right Sidelying to Sit;Sitting - Scoot to Edge of Bed Rolling Right: Minimal Assistance - Patient > 75% Right Sidelying to Sit: Moderate Assistance - Patient 50-74% Sitting - Scoot to Edge of Bed: Minimal Assistance - Patient > 75% Transfers Sit to Stand: Moderate Assistance - Patient 50-74%;Minimal Assistance - Patient > 75% Stand to Sit: Moderate Assistance - Patient 50-74%;Minimal Assistance - Patient > 75%   Discharge Criteria: Patient will be discharged from OT if patient refuses treatment 3 consecutive times without medical reason, if treatment goals not met, if there is a change in medical status, if patient makes no progress towards goals or if patient is discharged from hospital.  The above assessment, treatment plan, treatment alternatives and goals were discussed and mutually agreed upon: by patient  Catalina Lunger 09/01/2021, 12:44 PM

## 2021-09-01 NOTE — Discharge Instructions (Addendum)
Inpatient Rehab Discharge Instructions  Mathew Oliver Discharge date and time:  09/12/2021  Activities/Precautions/ Functional Status: Activity: no lifting, driving, or strenuous exercise for till cleared by MD Diet: diabetic diet Wound Care: keeps incision clean and dry   Functional status:  ___ No restrictions     ___ Walk up steps independently ___ 24/7 supervision/assistance   ___ Walk up steps with assistance __x_ Intermittent supervision/assistance  ___ Bathe/dress independently ___ Walk with walker     ___ Bathe/dress with assistance ___ Walk Independently    ___ Shower independently ___ Walk with assistance    __x_ Shower with assistance _X__ No alcohol     ___ Return to work/school ________    COMMUNITY REFERRALS UPON DISCHARGE:    Home Health:   PT     OT     RN    SNA                 Agency: *being arranged by worker's comp*  Phone:    Medical Equipment/Items Ordered: Being arranged by worker's comp                                                 Agency/Supplier:  Special Instructions:  No driving, alcohol consumption or tobacco use.   My questions have been answered and I understand these instructions. I will adhere to these goals and the provided educational materials after my discharge from the hospital.  Patient/Caregiver Signature _______________________________ Date __________  Clinician Signature _______________________________________ Date __________  Please bring this form and your medication list with you to all your follow-up doctor's appointments.       Plate Method for Diabetes   Check blood sugars 2-3 times daily and record. Take these measurements to your primary care provider.  Foods with carbohydrates make your blood glucose level go up. The plate method is a simple way to meal plan and control the amount of carbohydrate you eat.         Use the following guidance to build a healthy plate to control carbohydrates. Divide a 9-inch  plate into 3 sections, and consider your beverage the 4th section of your meal: Food Group Examples of Foods/Beverages for This Section of your Meal  Section 1: Non-starchy vegetables Fill  of your plate to include non-starchy vegetables Asparagus, broccoli, brussels sprouts, cabbage, carrots, cauliflower, celery, cucumber, green beans, mushrooms, peppers, salad greens, tomatoes, or zucchini.  Section 2: Protein foods Fill  of your plate to include a lean protein Lean meat, poultry, fish, seafood, cheese, eggs, lean deli meat, tofu, beans, lentils, nuts or nut butters.  Section 3: Carbohydrate foods Fill  of your plate to include carbohydrate foods Whole grains, whole wheat bread, brown rice, whole grain pasta, polenta, corn tortillas, fruit, or starchy vegetables (potatoes, green peas, corn, beans, acorn squash, and butternut squash). One cup of milk also counts as a food that contains carbohydrate.  Section 4: Beverage Choose water or a low-calorie drink for your beverage. Unsweetened tea, coffee, or flavored/sparkling water without added sugar.  Image reprinted with permission from The American Diabetes Association.  Copyright 2022 by the American Diabetes Association.   Copyright 2022  Academy of [employer] and Dietetics. All rights reserved

## 2021-09-01 NOTE — Plan of Care (Signed)
  Problem: RH Balance Goal: LTG Patient will maintain dynamic standing with ADLs (OT) Description: LTG:  Patient will maintain dynamic standing balance with assist during activities of daily living (OT)  Flowsheets (Taken 09/01/2021 1033) LTG: Pt will maintain dynamic standing balance during ADLs with: Supervision/Verbal cueing   Problem: RH Bathing Goal: LTG Patient will bathe all body parts with assist levels (OT) Description: LTG: Patient will bathe all body parts with assist levels (OT) Flowsheets (Taken 09/01/2021 1033) LTG: Pt will perform bathing with assistance level/cueing: Contact Guard/Touching assist   Problem: RH Dressing Goal: LTG Patient will perform lower body dressing w/assist (OT) Description: LTG: Patient will perform lower body dressing with assist, with/without cues in positioning using equipment (OT) Flowsheets (Taken 09/01/2021 1033) LTG: Pt will perform lower body dressing with assistance level of: Supervision/Verbal cueing   Problem: RH Toileting Goal: LTG Patient will perform toileting task (3/3 steps) with assistance level (OT) Description: LTG: Patient will perform toileting task (3/3 steps) with assistance level (OT)  Flowsheets (Taken 09/01/2021 1033) LTG: Pt will perform toileting task (3/3 steps) with assistance level: Supervision/Verbal cueing   Problem: RH Toilet Transfers Goal: LTG Patient will perform toilet transfers w/assist (OT) Description: LTG: Patient will perform toilet transfers with assist, with/without cues using equipment (OT) Flowsheets (Taken 09/01/2021 1033) LTG: Pt will perform toilet transfers with assistance level of: Supervision/Verbal cueing   Problem: RH Tub/Shower Transfers Goal: LTG Patient will perform tub/shower transfers w/assist (OT) Description: LTG: Patient will perform tub/shower transfers with assist, with/without cues using equipment (OT) Flowsheets (Taken 09/01/2021 1033) LTG: Pt will perform tub/shower stall transfers  with assistance level of: Supervision/Verbal cueing

## 2021-09-01 NOTE — Progress Notes (Signed)
Inpatient Rehabilitation Care Coordinator Assessment and Plan Patient Details  Name: Mathew Oliver MRN: [id number] Date of Birth: [birth year]  Today's Date: 09/01/2021  Hospital Problems: Principal Problem:   Trauma  Past Medical History:  Past Medical History:  Diagnosis Date   Anxiety    Chronic systolic (congestive) heart failure ([place])    Depression    Heart disease    LAD occlsuion   High blood pressure    MI, acute, non ST segment elevation ([place])    Past Surgical History:  Past Surgical History:  Procedure Laterality Date   CARDIAC CATHETERIZATION     [facility]- LAD   ORIF HUMERUS FRACTURE Left 08/25/2021   Procedure: OPEN REDUCTION INTERNAL FIXATION (ORIF) DISTAL HUMERUS FRACTURE;  Surgeon: Shona Needles, MD;  Location: [facility];  Service: Orthopedics;  Laterality: Left;   Social History:  reports that he has been smoking cigarettes. He does not have any smokeless tobacco history on file. He reports current alcohol use. He reports that he does not use drugs.  Family / Support Systems Marital Status: Married How Long?: 24 years Patient Roles: Spouse, Parent Spouse/Significant Other: Seth Bake (wife) Children: 1 adult son from previous relationship Other Supports: None reported Anticipated Caregiver: Pt wife Ability/Limitations of Caregiver: Wife able to provide 24/7 care Caregiver Availability: 24/7 Family Dynamics: Pt lives with his wife.  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 Education: high school [place] - How often do you need to have someone help you when you read instructions, pamphlets, or other written material from your doctor or pharmacy?: Never Writes: Yes Employment Status: Employed Name of Employer: Designer, industrial/product of Employment: 8 (years) Return to Work Plans: TBD Public relations account executive Issues:  Denies Guardian/Conservator: N/A   Abuse/Neglect Abuse/Neglect Assessment Can Be Completed: Yes Physical Abuse: Denies Verbal Abuse: Denies Sexual Abuse: Denies Exploitation of patient/patient's resources: Denies Self-Neglect: Denies  Patient response to: Social Isolation - How often do you feel lonely or isolated from those around you?: Never  Emotional Status Pt's affect, behavior and adjustment status: Pt doing ok at time of visit. Pt reports being a little depressed since being here. Recent Psychosocial Issues: Depression since admission as he really would like to go home. Agreeable to meeting with Dr. Sima Matas. Psychiatric History: Denies Substance Abuse History: Pt admits that he smokes cigarettes- 1ppd. Unsure if desire to quit since he has not had since admission. Declined patch and/or gum. Admits to drink a couple of beers on the weekend. Denies rec drug use.  Patient / Family Perceptions, Expectations & Goals Pt/Family understanding of illness & functional limitations: Pt has a general understanding of care needs Premorbid pt/family roles/activities: Independent Anticipated changes in roles/activities/participation: Assistance with ADLs/IADLs Pt/family expectations/goals: pt goal is to "get up moving around and get home, no fun staying here."  [employer]: None Premorbid Home Care/DME Agencies: None Transportation available at discharge: TBD Is the patient able to respond to transportation needs?: Yes In the past 12 months, has lack of transportation kept you from medical appointments or from getting medications?: No In the past 12 months, has lack of transportation kept you from meetings, work, or from getting things needed for daily living?: No Resource referrals recommended: Neuropsychology  Discharge Planning Living Arrangements: Spouse/significant other Support Systems: Spouse/significant other Type of Residence: Private  residence Insurance Resources: Insurance Case Manager (specify name) [facility] CM- Wallene Huh [phone]) Financial  Resources: Employment Museum/gallery curator Screen Referred: No Living Expenses: 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 Pt is not a English as a second language teacher. No hCPOA. No DME. Pt is aware will coordinate care with Worker's Comp CM- Melissa. Amenable to SW follow-up with his wife as well.   Rana Snare 09/01/2021, 3:48 PM

## 2021-09-01 NOTE — Progress Notes (Signed)
Initial Nutrition Assessment  DOCUMENTATION CODES:   Not applicable  INTERVENTION:   Multivitamin w/ minerals daily Ensure Enlive po BID, each supplement provides 350 kcal and 20 grams of protein. Encourage good PO intake  NUTRITION DIAGNOSIS:   Inadequate oral intake related to poor appetite as evidenced by per patient/family report.  GOAL:   Patient will meet greater than or equal to 90% of their needs  MONITOR:   PO intake, Supplement acceptance, Labs, Weight trends  REASON FOR ASSESSMENT:   Consult Diet education  ASSESSMENT:   61 y.o. male admitted to CIR after falling from a ladder and fracturing his L humerus, sacrum, and pubic rami. PMH includes HTN, CHF, and HLD.   Pt reports that his appetite was good PTA. He and his wife would cook dinner or would eat out. Pt reports that his appetite has been poor since he has been here, reports that he has had nausea today.  Pt noted to not have a BM x 8 days.   Pt denies any recent weight loss. Unsure of UBW. No weights within EMR to assess for weight loss.   Discussed using ONS until pt appetite returns; pt agreeable. Discussed with RN outside of room, RN reports that pt did not eat lunch. Discussed that we will start ONS until pt PO intake increases. RN reports that plan is for an enema tonight.   Medications reviewed and include: SSI, Miralax, Senokot, Spironolactone, Vitamin D Labs reviewed: Hgb A1c 7.5%   NUTRITION - FOCUSED PHYSICAL EXAM:  Flowsheet Row Most Recent Value  Orbital Region No depletion  Upper Arm Region No depletion  Thoracic and Lumbar Region No depletion  Buccal Region No depletion  Temple Region No depletion  Clavicle Bone Region No depletion  Clavicle and Acromion Bone Region No depletion  Scapular Bone Region No depletion  Dorsal Hand No depletion  Patellar Region No depletion  Anterior Thigh Region No depletion  Posterior Calf Region No depletion  Edema (RD Assessment) None  Hair  Reviewed  Eyes Reviewed  Mouth Reviewed  Skin Reviewed  Nails Reviewed       Diet Order:   Diet Order             Diet heart healthy/carb modified Room service appropriate? Yes; Fluid consistency: Thin  Diet effective now                   EDUCATION NEEDS:   No education needs have been identified at this time  Skin:  Skin Assessment: Reviewed RN Assessment  Last BM:  6/11  Height:   Ht Readings from Last 1 Encounters:  08/31/21 6' (1.829 m)    Weight:   Wt Readings from Last 1 Encounters:  09/01/21 129.1 kg    Ideal Body Weight:  80.9 kg  BMI:  Body mass index is 38.6 kg/m.  Estimated Nutritional Needs:   Kcal:  2300-2500  Protein:  115-130 grams  Fluid:  >/= 2 L    Kirby Crigler RD, LDN Clinical Dietitian See [facility] for contact information.

## 2021-09-01 NOTE — Progress Notes (Signed)
Inpatient Rehabilitation Admission Medication Review by a Pharmacist  A complete drug regimen review was completed for this patient to identify any potential clinically significant medication issues.  High Risk Drug Classes Is patient taking? Indication by Medication  Antipsychotic Yes Compazine- N/V  Anticoagulant Yes Apixaban- VTE prophylaxis  Antibiotic No   Opioid Yes Percocet, tramadol- acute post-op pain  Antiplatelet Yes Aspirin- CAD s/p stent placement 2009  Hypoglycemics/insulin Yes Insulin- T2DM  Vasoactive Medication Yes Coreg, lisinopril, spironolactone- HFrEF  Chemotherapy No   Other Yes Vitamin D- D deficiency Lipitor- HLD     Type of Medication Issue Identified Description of Issue Recommendation(s)  Drug Interaction(s) (clinically significant)     Duplicate Therapy     Allergy     No Medication Administration End Date     Incorrect Dose     Additional Drug Therapy Needed     Significant med changes from prior encounter (inform family/care partners about these prior to discharge).    Other  PTA meds have been continued upon admission. No issues to address     Clinically significant medication issues were identified that warrant physician communication and completion of prescribed/recommended actions by midnight of the next day:  No   Time spent performing this drug regimen review (minutes):  30  Mattie Novosel BS, PharmD, BCPS Clinical Pharmacist 09/01/2021 7:14 AM  Contact: [phone] after 3 PM  "Be curious, not judgmental..." -Debbora Dus

## 2021-09-01 NOTE — Progress Notes (Signed)
Jennye Boroughs, MD  Physician Physical Medicine and Rehabilitation PMR Pre-admission     Signed Date of Service:  08/27/2021  3:10 PM  Related encounter: ED to Hosp-Admission (Discharged) from 08/23/2021 in [place]      PMR Admission Coordinator Pre-Admission Assessment   Patient: Mathew Oliver is an 61 y.o., male MRN: [id number] DOB: 11-11-1960 Height: 6' (182.9 cm) Weight: (!) 147.2 kg   Insurance Information HMO:     PPO:      PCP:      IPA:      80/20:      OTHER:  PRIMARY: Workers Compensation/[employer]    Policy#: claim # [id number]      Subscriber: pt   CM Name: approved by Odette Horns at "[employer] "   Phone#:[phone] ext [phone]    Fax#: Melissa onsite field case manager getting clinical updates onsite and providing to [employer]  Pre-Cert#: claim # [id number] approved for 14 days from admit 6/18  .Follow up contact at [employer] is Donia Pounds phone [phone] ext [phone]     Employer: [employer] distribution Benefits:  Phone #: Jettie Pagan phone [phone] and [phone]    Field Case manager on site visiting patient daily is Wallene Huh [phone] ( I told her she would not be allowed at weekly team conference)   Adjuster is Danae Chen at [phone] MAIN CONTACT is Donia Pounds phone [phone] ext [phone] at "[employer]"   Financial Counselor:       Phone#:    The "Data Collection Information Summary" for patients in Inpatient Rehabilitation Facilities with attached "Privacy Act [place] Records" was provided and verbally reviewed with: N/A   Emergency Contact Information Contact Information       Name Relation Home Work Mobile    [place] Spouse [phone]   [phone]         Current Medical History  Patient Admitting Diagnosis: polytrauma   History of Present Illness: 61 year old male with history of HTN, HLD, CAD, heart failure with EF 40 to 45 % who  presented on 08/23/21 after a fall from ladder about 20 feet high at work. Fell on left side. No LOC.    Orthopedics consulted for evaluation of pelvic fractures and left distal humerus fracture. Imaging notable for left pubic bone fracture, superior pubic rami fracture, inferior pubic ramus fracture. Recommendations for WBAT bilateral lower extremities for his pelvic fractures. Imaging for left comminuted distal humerus fracture with displacement/dislocation and nondisplaced radial head fracture. CT also concerning for possible open fracture given extensive soft tissue swelling and soft tissue gas. NWB LUE. Cefazolin for possible open fracture. Dr Doreatha Martin with [facility] on 6/12 humerus Fracture.Continue pain management.    Received IV Lasix twice due to initial acute hypoxic respiratory failure. 2D echo with systolic dysfunction. Currently on Coreg, spironolactone and lisinopril .    Continue Miralax and Colace for constipation. Continue ASA , statins and beta blockers for history of CAD with S/P PCI.    Patient's medical record from [facility] has been reviewed by the rehabilitation admission coordinator and physician.   Past Medical History      Past Medical History:  Diagnosis Date   MI, acute, non ST segment elevation ([place])      Has the patient had major surgery during 100 days prior to admission? Yes   Family History   family history is not on file.   Current Medications  Current Facility-Administered Medications:    acetaminophen (TYLENOL) tablet 650 mg, 650 mg, Oral, Q6H PRN, 650 mg at 08/24/21 2156 **OR** acetaminophen (TYLENOL) suppository 650 mg, 650 mg, Rectal, Q6H PRN, McClung, Sarah A, PA-C   albuterol (PROVENTIL) (2.5 MG/3ML) 0.083% nebulizer solution 2.5 mg, 2.5 mg, Nebulization, Q2H PRN, Tamala Julian, Rondell A, MD   aspirin EC tablet 325 mg, 325 mg, Oral, QHS, Corinne Ports, PA-C, 325 mg at 08/28/21 2032   atorvastatin (LIPITOR) tablet 40 mg, 40 mg, Oral, QPM, Corinne Ports, PA-C, 40 mg at 08/29/21 1619   carvedilol (COREG) tablet 25 mg, 25 mg, Oral, BID WC, Pokhrel, Laxman, MD, 25 mg at 08/29/21 1619   Chlorhexidine Gluconate Cloth 2 % PADS 6 each, 6 each, Topical, Daily, Corinne Ports, PA-C, 6 each at 08/29/21 1053   cholecalciferol (VITAMIN D3) tablet 1,000 Units, 1,000 Units, Oral, Daily, Corinne Ports, PA-C, 1,000 Units at 08/29/21 1051   docusate sodium (COLACE) capsule 100 mg, 100 mg, Oral, BID, McClung, Sarah A, PA-C, 100 mg at 08/29/21 1051   enoxaparin (LOVENOX) injection 70 mg, 70 mg, Subcutaneous, Q24H, McClung, Sarah A, PA-C, 70 mg at 08/29/21 1051   HYDROmorphone (DILAUDID) injection 0.5-1 mg, 0.5-1 mg, Intravenous, Q4H PRN, Corinne Ports, PA-C, 1 mg at 08/29/21 1317   lisinopril (ZESTRIL) tablet 40 mg, 40 mg, Oral, QPM, Pokhrel, Laxman, MD, 40 mg at 08/29/21 1619   methocarbamol (ROBAXIN) tablet 500 mg, 500 mg, Oral, Q6H PRN, 500 mg at 08/29/21 0147 **OR** methocarbamol (ROBAXIN) 500 mg in dextrose 5 % 50 mL IVPB, 500 mg, Intravenous, Q6H PRN, McClung, Sarah A, PA-C   metoCLOPramide (REGLAN) tablet 5-10 mg, 5-10 mg, Oral, Q8H PRN **OR** metoCLOPramide (REGLAN) injection 5-10 mg, 5-10 mg, Intravenous, Q8H PRN, McClung, Sarah A, PA-C   mupirocin ointment (BACTROBAN) 2 % 1 application , 1 application , Nasal, BID, Corinne Ports, PA-C, 1 application  at 08/81/10 1051   ondansetron (ZOFRAN) tablet 4 mg, 4 mg, Oral, Q6H PRN **OR** ondansetron (ZOFRAN) injection 4 mg, 4 mg, Intravenous, Q6H PRN, McClung, Sarah A, PA-C   oxyCODONE-acetaminophen (PERCOCET/ROXICET) 5-325 MG per tablet 1-2 tablet, 1-2 tablet, Oral, Q6H PRN, Corinne Ports, PA-C, 2 tablet at 08/29/21 1619   polyethylene glycol (MIRALAX / GLYCOLAX) packet 17 g, 17 g, Oral, Daily, Pokhrel, Laxman, MD, 17 g at 08/29/21 1051   sodium chloride flush (NS) 0.9 % injection 3 mL, 3 mL, Intravenous, Q12H, McClung, Sarah A, PA-C, 3 mL at 08/29/21 1052   spironolactone (ALDACTONE) tablet 25  mg, 25 mg, Oral, QPM, Pokhrel, Laxman, MD, 25 mg at 08/29/21 1619   traMADol (ULTRAM) tablet 50 mg, 50 mg, Oral, Q6H, McClung, Sarah A, PA-C, 50 mg at 08/29/21 1619   Vitamin D (Ergocalciferol) (DRISDOL) capsule 50,000 Units, 50,000 Units, Oral, Q7 days, Pokhrel, Laxman, MD, 50,000 Units at 08/26/21 1259   Patients Current Diet:  Diet Order                  Diet Heart Room service appropriate? Yes; Fluid consistency: Thin  Diet effective now                       Precautions / Restrictions Precautions Precautions: Fall Restrictions Weight Bearing Restrictions: Yes LUE Weight Bearing: Non weight bearing    Has the patient had 2 or more falls or a fall with injury in the past year? Yes   Prior Activity Level Community (5-7x/wk): Independent, driving, and working  Prior Functional Level Self Care: Did the patient need help bathing, dressing, using the toilet or eating? Independent   Indoor Mobility: Did the patient need assistance with walking from room to room (with or without device)? Independent   Stairs: Did the patient need assistance with internal or external stairs (with or without device)? Independent   Functional Cognition: Did the patient need help planning regular tasks such as shopping or remembering to take medications? Independent   Patient Information Are you of Hispanic, Latino/a,or Spanish origin?: A. No, not of Hispanic, Latino/a, or Spanish origin What is your race?: A. White Do you need or want an interpreter to communicate with a doctor or health care staff?: 0. No   Patient's Response To:  Health Literacy and Transportation Is the patient able to respond to health literacy and transportation needs?: Yes Health Literacy - How often do you need to have someone help you when you read instructions, pamphlets, or other written material from your doctor or pharmacy?: Never In the past 12 months, has lack of transportation kept you from medical appointments  or from getting medications?: No In the past 12 months, has lack of transportation kept you from meetings, work, or from getting things needed for daily living?: No   Development worker, international aid / [place] Devices/Equipment: None Home Equipment: Grab bars - tub/shower   Prior Device Use: Indicate devices/aids used by the patient prior to current illness, exacerbation or injury? None of the above   Current Functional Level Cognition   Overall Cognitive Status: Within Functional Limits for tasks assessed Orientation Level: Oriented X4    Extremity Assessment (includes Sensation/Coordination)   Upper Extremity Assessment: LUE deficits/detail LUE Deficits / Details: Completed AAROM L elbow and shoulder to pain tolerance, AROM forearm, wrist and hand, unhindered by pain. LUE Coordination: decreased gross motor  Lower Extremity Assessment: Defer to PT evaluation RLE Deficits / Details: grossly functional, pt reports general soreness with movement due to pelvic pain LLE Deficits / Details: limited by pain, limited movement against gravity at hip, functional at knee and ankle LLE: Unable to fully assess due to pain LLE Sensation: WNL LLE Coordination: WNL     ADLs   Overall ADL's : Needs assistance/impaired Eating/Feeding: Bed level, Minimal assistance Grooming: Minimal assistance, Sitting, Wash/dry hands, Wash/dry face, Oral care Upper Body Bathing: Maximal assistance Lower Body Bathing: Total assistance Upper Body Dressing : Maximal assistance, Bed level Lower Body Dressing: Total assistance General ADL Comments: pt return back to bed just prior to arrival. pt reports sitting up for over one hour and half. pt feeling hot on arrival but agreeable to starting ROM of L UE. Pt provided 4 new batteries for portal hospital fans in the room     Mobility   Overal bed mobility: Needs Assistance Bed Mobility: Supine to Sit Supine to sit: Min assist Sit to supine: Mod  assist General bed mobility comments: Cues provided for sequencing and HHA utilized on R as well as side rail on R.     Transfers   Overall transfer level: Needs assistance Equipment used: Rolling walker (2 wheels) (serving as hemi-walker on right side) Transfers: Sit to/from Stand Sit to Stand: +2 physical assistance, +2 safety/equipment, Min assist Bed to/from chair/wheelchair/BSC transfer type:: Via Lift equipment  Lateral/Scoot Transfers: Mod assist, From elevated surface Transfer via Lift Equipment: Stedy General transfer comment: Pt utilized RW with R UE to arise to standing position. Pt performed pre-gait weight shifts side to side and was able  to progress to lifting R LE off floor several times although L knee was blocked.  Pt sat down at EOB after fatiguing but then again performed sit to stand for side steps at side of bed.     Ambulation / Gait / Stairs / Wheelchair Mobility   Ambulation/Gait Ambulation/Gait assistance: Mod assist, +2 physical assistance, +2 safety/equipment Gait Distance (Feet): 2 Feet Assistive device: Rolling walker (2 wheels) (RW utilized as hemi-walker on R side) Gait Pattern/deviations: Decreased stance time - right, Decreased stance time - left, Decreased weight shift to left, Antalgic General Gait Details: Pt performed side steps while standing next to bed. Therapist assisted pt with moving RW (functioning as hemi-walker) and tech assisted pt by blocking his left knee when right LE was in swing phase.     Posture / Balance Dynamic Sitting Balance Sitting balance - Comments: pt able to maintain static sitting balance at midline with single arm support Balance Overall balance assessment: Needs assistance Sitting-balance support: Single extremity supported, Feet supported Sitting balance-Leahy Scale: Fair Sitting balance - Comments: pt able to maintain static sitting balance at midline with single arm support Postural control: Right lateral lean Standing  balance support: Single extremity supported, No upper extremity supported Standing balance-Leahy Scale: Poor Standing balance comment: pt able to maintain good static standing balance with R UE on RW     Special needs/care consideration      Previous Home Environment  Living Arrangements: Spouse/significant other  Lives With: Spouse Available Help at Discharge: Family, Available 24 hours/day Type of Home: Mobile home Home Layout: One level Home Access: Stairs to enter Entrance Stairs-Rails: None Entrance Stairs-Number of Steps: 1-2 Bathroom Shower/Tub: Chiropodist: Handicapped height Bathroom Accessibility: Yes How Accessible: Accessible via walker Home Care Services: No Additional Comments: wife can (A)   Discharge Living Setting Plans for Discharge Living Setting: Patient's home, Lives with (comment), Mobile Home (wife) Type of Home at Discharge: Mobile home Discharge Home Layout: One level Discharge Home Access: Stairs to enter Entrance Stairs-Rails: None Entrance Stairs-Number of Steps: 1-2 Discharge Bathroom Shower/Tub: Tub/shower unit Discharge Bathroom Toilet: Handicapped height Discharge Bathroom Accessibility: Yes How Accessible: Accessible via walker Does the patient have any problems obtaining your medications?: No   Social/Family/Support Systems Patient Roles: Spouse, Parent (employee) Contact Information: wife, Seth Bake Anticipated Caregiver: wife and family Anticipated Caregiver's Contact Information: see contacts Ability/Limitations of Caregiver: none Caregiver Availability: 24/7 Discharge Plan Discussed with Primary Caregiver: Yes Is Caregiver In Agreement with Plan?: Yes Does Caregiver/Family have Issues with Lodging/Transportation while Pt is in Rehab?: No   Goals Patient/Family Goal for Rehab: supervision PT, supervision to min with OT Expected length of stay: ELOS 10 to 12 days Pt/Family Agrees to Admission and willing to  participate: Yes Program Orientation Provided & Reviewed with Pt/Caregiver Including Roles  & Responsibilities: Yes   Decrease burden of Care through IP rehab admission: n/a   Possible need for SNF placement upon discharge: not anticipated   Patient Condition: I have reviewed medical records from [place], spoken with CM, and patient and spouse. I met with patient at the bedside for inpatient rehabilitation assessment.  Patient will benefit from ongoing PT and OT, can actively participate in 3 hours of therapy a day 5 days of the week, and can make measurable gains during the admission.  Patient will also benefit from the coordinated team approach during an Inpatient Acute Rehabilitation admission.  The patient will receive intensive therapy as well as Rehabilitation physician, nursing, social worker,  and care management interventions.  Due to bladder management, bowel management, safety, skin/wound care, disease management, medication administration, pain management, and patient education the patient requires 24 hour a day rehabilitation nursing.  The patient is currently mod assist overall with mobility and basic ADLs.  Discharge setting and therapy post discharge at home with home health is anticipated.  Patient has agreed to participate in the Acute Inpatient Rehabilitation Program and will admit 08/31/21 when bed is available.    Preadmission Screen Completed By:  Cleatrice Burke, 08/29/2021 4:44 PM ______________________________________________________________________   Discussed status with Dr. Curlene Dolphin on 08/29/21 at 1645 and received approval for admission 08/31/21 when bed is available.   Admission Coordinator:  Cleatrice Burke, RN, time 5883 Date 08/29/21    Assessment/Plan: Diagnosis: Polytrauma due to fall of a ladder with  left superior and inferior pubic rami and left sacrum fractures, complex left distal humerus fracture  Does the need for close, 24 hr/day Medical  supervision in concert with the patient's rehab needs make it unreasonable for this patient to be served in a less intensive setting? Yes Co-Morbidities requiring supervision/potential complications: CAD, systolic heart failure, HTN, hyperlipidemia Due to bladder management, bowel management, safety, skin/wound care, disease management, medication administration, pain management, and patient education, does the patient require 24 hr/day rehab nursing? Yes Does the patient require coordinated care of a physician, rehab nurse, PT, OT, and SLP to address physical and functional deficits in the context of the above medical diagnosis(es)? Yes Addressing deficits in the following areas: balance, endurance, locomotion, strength, transferring, bowel/bladder control, bathing, dressing, feeding, grooming, and toileting Can the patient actively participate in an intensive therapy program of at least 3 hrs of therapy 5 days a week? Yes The potential for patient to make measurable gains while on inpatient rehab is excellent Anticipated functional outcomes upon discharge from inpatient rehab: supervision PT, supervision and min assist OT, n/a SLP Estimated rehab length of stay to reach the above functional goals is: 10-12 Anticipated discharge destination: Home 10. Overall Rehab/Functional Prognosis: excellent     MD Signature: Jennye Boroughs         Revision History  Date/Time User Provider Type Action  08/31/2021  9:36 AM Jennye Boroughs, MD Physician Sign  08/29/2021  4:45 PM Cristina Gong, RN Rehab Admission Coordinator Share  08/27/2021  3:22 PM Cristina Gong, RN Rehab Admission Coordinator Share   View Details Report      Note Details  Traci Sermon, MD File Time 08/31/2021  9:36 AM  Author Type Physician Status Signed  Last Editor Jennye Boroughs, MD Service Physical Medicine and [place] # [id number] Admit Date 08/31/2021

## 2021-09-01 NOTE — Progress Notes (Signed)
SSE given. No immediate results noted. Brown liquid returned, no solid pieces. Mylo Red, LPN

## 2021-09-01 NOTE — Progress Notes (Signed)
Inpatient Rehabilitation  Patient information reviewed and entered into eRehab system by Juanjesus Pepperman M. Asha Grumbine, M.A., CCC/SLP, PPS Coordinator.  Information including medical coding, functional ability and quality indicators will be reviewed and updated through discharge.    

## 2021-09-02 ENCOUNTER — Inpatient Hospital Stay (HOSPITAL_COMMUNITY): Payer: Self-pay

## 2021-09-02 DIAGNOSIS — E119 Type 2 diabetes mellitus without complications: Secondary | ICD-10-CM

## 2021-09-02 DIAGNOSIS — K5903 Drug induced constipation: Secondary | ICD-10-CM

## 2021-09-02 DIAGNOSIS — R7989 Other specified abnormal findings of blood chemistry: Secondary | ICD-10-CM

## 2021-09-02 LAB — GLUCOSE, CAPILLARY
Glucose-Capillary: 121 mg/dL — ABNORMAL HIGH (ref 70–99)
Glucose-Capillary: 111 mg/dL — ABNORMAL HIGH (ref 70–99)
Glucose-Capillary: 109 mg/dL — ABNORMAL HIGH (ref 70–99)
Glucose-Capillary: 113 mg/dL — ABNORMAL HIGH (ref 70–99)

## 2021-09-02 MED ORDER — POLYETHYLENE GLYCOL 3350 17 G PO PACK
34.0000 g | PACK | Freq: Two times a day (BID) | ORAL | Status: AC
  Administered 2021-09-02 (×2): 34 g via ORAL
  Filled 2021-09-02 (×2): qty 2

## 2021-09-02 MED ORDER — POLYETHYLENE GLYCOL 3350 17 G PO PACK
34.0000 g | PACK | Freq: Every day | ORAL | Status: DC
  Administered 2021-09-03 – 2021-09-05 (×3): 34 g via ORAL
  Filled 2021-09-02 (×7): qty 2

## 2021-09-02 MED ORDER — OXYCODONE HCL 5 MG PO TABS
5.0000 mg | ORAL_TABLET | Freq: Two times a day (BID) | ORAL | Status: DC
  Administered 2021-09-02 – 2021-09-12 (×21): 5 mg via ORAL
  Filled 2021-09-02 (×21): qty 1

## 2021-09-02 NOTE — Progress Notes (Addendum)
Physical Therapy Session Note  Patient Details  Name: Mathew Oliver MRN: [id number] Date of Birth: 1960/07/02  Today's Date: 09/02/2021 PT Individual Time: [phone] and 1130-1200 PT Individual Time Calculation (min): 55 min and 30 min   Short Term Goals: Week 1:  PT Short Term Goal 1 (Week 1): Patient will perform bed mobility with min A using home set-up. PT Short Term Goal 2 (Week 1): Patient will perform basic transfers with CGA using LRAD. PT Short Term Goal 3 (Week 1): Patient will ambulate >10 feet using LRAD. PT Short Term Goal 4 (Week 1): Patient will propel w/c >50 feet.  Skilled Therapeutic Interventions/Progress Updates:     Session 1: Patient in bed upon PT arrival. Patient alert and agreeable to PT session. Patient reported 2-3/10 progressing to 8-9/10 L pelvic pain during session, RN made aware. PT provided repositioning, rest breaks, and distraction as pain interventions throughout session.   Patient expressed distress over loss of independence and concerns for future mobility. Reports, "I just want to cry." Provided therapeutic listening and educated on bone healing and potential for recovery, also provided coping strategies. Patient appreciative.   Patient with dark amber urine in urinal, educated on clear fluid intake. Patient reports drinking less in the hospital and stated the water does not taste good to him. Encourage fluid intake and family brining preferred clear liquids from home, patient in agreement  Therapeutic Activity: Bed Mobility: Patient performed supine to/from sit with min-mod A for trunk and lower extremity management. Provided verbal cues for performing log roll to R side and using his R arm for trunk control. Patient compliant with L upper extremity NWB throughout. Donned L shoulder sling with total A and education on placement.  Transfers: Patient performed stand pivot bed<>w/c using hemi-walker to the L and bed rail to the R with min A. He  performed sit to/from stand x1 with min A using R rail in // bars, patient with sudden onset of feeling hot and lightheaded with weight shift to L lower extremity.   Therapeutic Exercise: Patient performed the following exercises with verbal and tactile cues for proper technique. -L heel slides 2x5 -B glut sets x10 with 5 sec hold -L hip abd/add x10 with AAROM <50% assist -L hip/knee flexion stretch 2x10 sec hold  Patient in bed at end of session with breaks locked, bed alarm set, and all needs within reach.   Session 2: Patient in bed upon PT arrival. Patient alert and agreeable to PT session. Patient reported 6/10 L pelvic pain during session, RN made aware. PT provided repositioning, rest breaks, and distraction as pain interventions throughout session.   Patient reported poor tolerance with transferring OOB this morning, see above, and did not want to get OOB during session. Patient with several questions about healing, "damage" caused by fx's, and recovery. Spent increased time reviewing imaging of his L elbow and pelvis, educated on location of fractures, internal fixation to L elbow and purpose of hardware, bone healing, and progress of recovery to recovery ROM and strength. Discussed d/c date set for next week, plan for family education with his wife, and follow-up therapy to continue his recovery at d/c. Patient appreciative of education and break from mobility due to pain.   Patient in bed at end of session with breaks locked, bed alarm set, and all needs within reach.   Therapy Documentation Precautions:  Precautions Precautions: Fall Required Braces or Orthoses: Sling (L upper extremity) Restrictions Weight Bearing Restrictions: Yes LUE Weight  Bearing: Non weight bearing LLE Weight Bearing: Weight bearing as tolerated Other Position/Activity Restrictions: Sling was not on when OT entered the room. Pt supine in bed.    Therapy/Group: Individual Therapy  Marjorie Deprey L Dorthie Santini  PT, DPT  09/02/2021, 12:23 PM

## 2021-09-02 NOTE — Progress Notes (Addendum)
Bowel program discussed with Dr. Benjie Karvonen --Mag citrate off market still. Patient is feeling better today --no nausea or eructation and abdomen is SNT.   Contacted pharmacy for input on appropriate regiment-->will add miralax 34 oz in 8 ounces bid today and follow with daily as on opiates.  Will recheck CMET on 06/22 for follow up on abnormal LFTs and monitor K+ level.

## 2021-09-02 NOTE — Progress Notes (Signed)
Occupational Therapy Session Note  Patient Details  Name: Mathew Oliver MRN: [id number] Date of Birth: 1960-08-22  Today's Date: 09/02/2021 OT Individual Time: 1155-2080 OT Individual Time Calculation (min): 65 min    Short Term Goals: Week 1:  OT Short Term Goal 1 (Week 1): Pt will complete LB dressing with Mod A. OT Short Term Goal 2 (Week 1): Pt will complete toilet transfer with Min A. OT Short Term Goal 3 (Week 1): Pt will complete functional mobility with LRAD with Min A to increase independence in ADLs. OT Short Term Goal 4 (Week 1): Pt will complete UB/LB bathing with Min A.  Skilled Therapeutic Interventions/Progress Updates:    Pt received supine in bed and agreeable to OT session. Pt reports 8/10 pain in LLE at start/during session. Requesting adjustments to medications to alieviate pain during sessions. Pt willing to take a shower. Complete stand pivot onto bariatric BSC to shower with Min A. Pt began crying while sitting in the shower voicing feelings of pain and depression. Pt voicing frustration with coping to injury. Encouraged pt to discuss with others and provided emotional support. Pt showered with Min A overall. Required assistance to wash back and bottom while standing. Pt complete stand pivot to w/c with Min A and verbal cues for hand/foot placement. Pt complete UB/LB dressing seated in w/v with Min A overall. Therapist donned socks with Total A d/t pain leaning forward. Completed oral care at the sink with set up. Therapist placed sacral foam pad on Pts bottom to improve skin integrity. Pt left supine in bed with alarm on, call bell in reach, and all other needs met. 10 min missed d/t fatigue.  Therapy Documentation Precautions:  Precautions Precautions: Fall Required Braces or Orthoses: Sling (L upper extremity) Restrictions Weight Bearing Restrictions: Yes LUE Weight Bearing: Non weight bearing LLE Weight Bearing: Weight bearing as tolerated Other  Position/Activity Restrictions: Sling was not on when OT entered the room. Pt supine in bed.  Therapy/Group: Individual Therapy  Catalina Lunger 09/02/2021, 7:22 AM

## 2021-09-02 NOTE — Progress Notes (Signed)
PROGRESS NOTE   Subjective/Complaints:  Reports pain is poorly controlled.  He continues to be constipated but belching has decreased.   ROS - Neg CP, SOB, N/V/D + constipation  Objective:   No results found. Recent Labs    09/01/21 0555  WBC 10.2  HGB 12.5*  HCT 37.4*  PLT 312    Recent Labs    09/01/21 0555  NA 139  K 4.3  CL 103  CO2 26  GLUCOSE 129*  BUN 20  CREATININE 0.67  CALCIUM 9.0     Intake/Output Summary (Last 24 hours) at 09/02/2021 1009 Last data filed at 09/02/2021 0745 Gross per 24 hour  Intake 240 ml  Output 825 ml  Net -585 ml         Physical Exam: Vital Signs Blood pressure 138/84, pulse 67, temperature 98 F (36.7 C), resp. rate 18, height 6' (1.829 m), weight 130.7 kg, SpO2 92 %.   General: No acute distress Mood and affect are appropriate Heart: Regular rate and rhythm no rubs murmurs or extra sounds Lungs: Clear to auscultation, breathing unlabored, no rales or wheezes Abdomen: Positive bowel sounds, soft nontender to palpation, mildly distended Extremities: No clubbing, cyanosis, or edema Skin: No evidence of breakdown, no evidence of rash Neurologic: Cranial nerves II through XII intact, motor strength is 5/5 in RIght  deltoid, bicep, tricep, grip, Left grip 4/5 LUE not tested due to fracture, 3- hip flexor, knee extensors, 4/5 ankle dorsiflexor and plantar flexor (limited by Hip pain)   Musculoskeletal: limited range LUE and LEs due to pain /fractures    Assessment/Plan: 1. Functional deficits which require 3+ hours per day of interdisciplinary therapy in a comprehensive inpatient rehab setting. Physiatrist is providing close team supervision and 24 hour management of active medical problems listed below. Physiatrist and rehab team continue to assess barriers to discharge/monitor patient progress toward functional and medical goals  Care Tool:  Bathing    Body  parts bathed by patient: Right arm, Left arm, Chest, Abdomen, Front perineal area, Right upper leg, Left upper leg, Face     Body parts n/a: Buttocks, Right lower leg, Left lower leg   Bathing assist Assist Level: Moderate Assistance - Patient 50 - 74%     Upper Body Dressing/Undressing Upper body dressing   What is the patient wearing?: Hospital gown only, Pull over shirt    Upper body assist Assist Level: Contact Guard/Touching assist    Lower Body Dressing/Undressing Lower body dressing      What is the patient wearing?: Pants     Lower body assist Assist for lower body dressing: Maximal Assistance - Patient 25 - 49%     Toileting Toileting    Toileting assist Assist for toileting: Independent with assistive device (urinal) Assistive Device Comment: urinal   Transfers Chair/bed transfer  Transfers assist     Chair/bed transfer assist level: Moderate Assistance - Patient 50 - 74%     Locomotion Ambulation   Ambulation assist      Assist level: Moderate Assistance - Patient 50 - 74% Assistive device: Parallel bars Max distance: 2 ft   Walk 10 feet activity   Assist  Walk  10 feet activity did not occur: Safety/medical concerns        Walk 50 feet activity   Assist Walk 50 feet with 2 turns activity did not occur: Safety/medical concerns         Walk 150 feet activity   Assist Walk 150 feet activity did not occur: Safety/medical concerns         Walk 10 feet on uneven surface  activity   Assist Walk 10 feet on uneven surfaces activity did not occur: Safety/medical concerns         Wheelchair     Assist Is the patient using a wheelchair?: Yes Type of Wheelchair: Manual    Wheelchair assist level: Supervision/Verbal cueing Max wheelchair distance: 15 ft    Wheelchair 50 feet with 2 turns activity    Assist        Assist Level: Total Assistance - Patient < 25%   Wheelchair 150 feet activity     Assist       Assist Level: Total Assistance - Patient < 25%   Blood pressure 138/84, pulse 67, temperature 98 F (36.7 C), resp. rate 18, height 6' (1.829 m), weight 130.7 kg, SpO2 92 %.  Medical Problem List and Plan: 1. Functional deficits secondary to polytrauma with Left distal humerus Fx, Sacral/pubic rami FX             -patient may shower cover surgical incision             -ELOS/Goals: 10-12 supervision PT, supervision to min with OT  -Team Conference today             -Continue CIR 2.  Antithrombotics: -DVT/anticoagulation:  Pharmaceutical: transition to Eliquis 2.5 mg BID X 30 day             -antiplatelet therapy: ASA 3. Pain Management: Will add Oxycontin for more consistent pain relief and d/c IV dilaudid            -6/20 Restart oxycodone PRN             --continue robaxin 4. Mood/Sleep: LCSW to follow for evaluation and support.              -antipsychotic agents: N/A 5. Neuropsych/cognition: This patient  is capable of making decisions on his own behalf. 6. Skin/Wound Care: Routine pressure relief measures. Monitor wound for healing.  7. Fluids/Electrolytes/Nutrition: Monitor I/O. Check CMET on Monday 8. Left distal humerus Fx s/p ORIF on 08/25/21: NWB LUE. Follow up X rays in 2 weeks --Sling for comfort.  9. CAD w/systolic CHF: EF 16-10%40-45%. Monitor for signs of overload. Check wts daily.  --Monitor for symptoms with increase in activity.              --continue ASA, Coreg, Lipitor, aldactone and Zestril             -Heart healthy diet 10. New diagnosis T2DM: Hgb A1C- 7.5.  --WIll start monitoring BS ac/hs and use SSI for elevated BS -Glucose has been stable, continue to monitor, consider change to daily CBGs            CBG (last 3)  Recent Labs    09/01/21 1712 09/01/21 2116 09/02/21 0555  GLUCAP 106* 112* 121*     11. Sacral FX: WBAT 12. Acute blood loss anemia: Monitor for signs of bleeding. HGB 11.8             --6/20 HGB stable at 12.5 13. Morbid obesity-BMI 44:  Encourage  appropriate diet and activity for wt loss to promote mobility and overall  health.  14. Vitamin D deficiency:  On Ergocalciferol for supplement.  15. Constipation on colace and miralax -abd xray today- no signs of impaction - 34 oz in 8 ounces bid today  16.HLD on Lipitor 17 Acute hypoxic respiratory failure. Resolved. Due to CHF. Had lasix IV x2  18.  HTN Vitals:   09/01/21 1942 09/02/21 0457  BP: 120/71 138/84  Pulse: 64 67  Resp: 18 18  Temp: 97.9 F (36.6 C) 98 F (36.7 C)  SpO2: 98% 92%   Cont Coreg, Lisinopril, aldactone-6/19 18. Elevated LFTs  -6/20 Recheck CMP thursday LOS: 2 days A FACE TO FACE EVALUATION WAS PERFORMED  Fanny Dance 09/02/2021, 10:09 AM

## 2021-09-02 NOTE — Progress Notes (Signed)
Patient ID: Mathew Oliver, male   DOB: [birth year], 60 y.o.   MRN: [id number]  SW met with pt in room to provide updates from team conference, and d/c date 6/30. Pt aware SW will follow-up with his wife and WC CM Wallene Huh [phone].  1620-SW left message for Melissa to inform on d/c date, DME order that will be provided tomorrow (22x18 BLERs, DABSC, TTB, hemiwalker) and HH therapies (HHPT/OT/aide/SN).  SW made efforts to contact pt wife but no answer.   Loralee Pacas, MSW, [place] Office: [phone] Cell: [phone] Fax: [phone]

## 2021-09-02 NOTE — Progress Notes (Signed)
Occupational Therapy Session Note  Patient Details  Name: Mathew Oliver MRN: [id number] Date of Birth: [birth year]  Today's Date: 09/02/2021 OT Individual Time: 1405-1505 OT Individual Time Calculation (min): 60 min    Short Term Goals: Week 1:  OT Short Term Goal 1 (Week 1): Pt will complete LB dressing with Mod A. OT Short Term Goal 2 (Week 1): Pt will complete toilet transfer with Min A. OT Short Term Goal 3 (Week 1): Pt will complete functional mobility with LRAD with Min A to increase independence in ADLs. OT Short Term Goal 4 (Week 1): Pt will complete UB/LB bathing with Min A.  Skilled Therapeutic Interventions/Progress Updates:    Pt greeted semi-reclined in bed, reported pain was managed at this moment. Declined to get any more meds prior to therapy. Pt agreeable to try some standing and taking a few steps. Pt needed min A and increased time to get to EOB using bed rails. Pt with increased pain sitting EOB. Sit<>stand with min A. Then took one step with each LE using hemi-walker. Pt unable to take another step forward, but took a step back to EOB. OT noted pt incontinent of loose BM and pt agreeable to get to [facility]. Bariatric raised drop arm BSC placed EOB. Pt performed lateral scoot to drop arm BSC with min A. Pt declined lateral leans to manage clothing and wanted to try standing again. Min A to get to standing using bed rail that was all the way up. Pt unable to remove R UE from support requiring max A for clothing management. Pt with successful large, loose BM. Pt unable to tolerate sitting on hard BSC for too long. Will try to find more comfortable option. Pt needed total A for peri-care today. He was still leaking stool and unaware. Pt stood again with min A for OT to wash buttocks again. Clean pants and brief donned with max A. Pt returned to supine with max A to lift LE's. L UE removed from sling and OT brought pt through L: elbow flex/ext 3 sets of 10 with focus on improved  extension. Pt left semi-reclined in bed with L UE supported, call bell in reach, and needs met.   Therapy Documentation Precautions:  Precautions Precautions: Fall Required Braces or Orthoses: Sling (L upper extremity) Restrictions Weight Bearing Restrictions: Yes LUE Weight Bearing: Non weight bearing LLE Weight Bearing: Weight bearing as tolerated Other Position/Activity Restrictions: Sling was not on when OT entered the room. Pt supine in bed. Pain:  7/10 L hip, rest and repositioned for pain management   Therapy/Group: Individual Therapy  Valma Cava 09/02/2021, 2:50 PM

## 2021-09-02 NOTE — Patient Care Conference (Signed)
Inpatient RehabilitationTeam Conference and Plan of Care Update Date: 09/02/2021   Time: 10:08 AM    Patient Name: Mathew Oliver      Medical Record Number: [id number]  Date of Birth: [birth year] Sex: Male         Room/Bed: 5Z02H/8N27P-82 Payor Info: Payor: [employer] WORKER'[employer] COMP / Plan: [employer] / Product Type: *No Product type* /    Admit Date/Time:  08/31/2021  2:58 PM  Primary Diagnosis:  Trauma  Hospital Problems: Principal Problem:   Trauma    Expected Discharge Date: Expected Discharge Date: 09/12/21  Team Members Present: Physician leading conference: Dr. Fanny Dance Social Worker Present: Cecile Sheerer, LCSWA Nurse Present: Kennyth Arnold, RN PT Present: Serina Cowper, PT OT Present: Jake Shark, OT SLP Present: Feliberto Gottron, SLP PPS Coordinator present : Fae Pippin, SLP     Current Status/Progress Goal Weekly Team Focus  Bowel/Bladder   Continent of B/B.  Remain continent  Assist with toileting as needed   Swallow/Nutrition/ Hydration             ADL's   MinA UB ADLs, Mod A LB ADLs, Min/mod A stand-pivot, high pain, likely will be wc level and transfers only  supervision  Pain management, Sit<>stands, transfers, self-care retraining, LB and UB strengthening   Mobility   min-mod A overall, gait limited to 2 feet due to pelvic pain and LUE NWB  Supervision w/c level, CGA standing and gait  strength/ROM, activity tolerance, pain tolerance, sitting tolerance, functional mobility, gait training, patient/caregiver education   Communication             Safety/Cognition/ Behavioral Observations            Pain   Pain to left hip. Pt on scheduled q6hr tramadol  Pain well controlled  Assess Qshift and prn   Skin   MASD to buttocks, Left elbow incision.  Promote healing and prevention of new breakdown  Assess Qshift and prn     Discharge Planning:  D/c to home with patietn wife. Pt is worker's comp case. WC to help coordinate pt care needs  at discharge. SW will confirm there are no barriers to discharge.   Team Discussion: Complaints of pain, begin Oxy IR before therapy. Constipated, abdominal scan ordered. Bowel medications continued. Continent B/B, Tramadol scheduled, Tylenol PRN. Nursing to address Stage 2 reported on bottom. Left arm incision CDI. Discharging home, Worker's Comp to follow. Pain with transfers, reports being light headed due to pain. Reports depression.  Patient on target to meet rehab goals: yes, supervision goals at [facility] level, CGA gait up to  25 ft. Currently min assist upper body, mod assist lower body. Min/mod assist with gait, limited due to pain.  *See Care Plan and progress notes for long and short-term goals.   Revisions to Treatment Plan:  Adjusting medications   Teaching Needs: Family education, medication/pain management, skin/wound care, transfer/gait training, etc.   Current Barriers to Discharge: Decreased caregiver support, Home enviroment access/layout, Wound care, Lack of/limited family support, Weight, and Weight bearing restrictions  Possible Resolutions to Barriers: Family education Follow-up therapy Order recommended DME     Medical Summary Current Status: CAD, Diabetes, sacral fx, pain, HLD, HTN, constipation  Barriers to Discharge: Medical stability;Home enviroment access/layout  Barriers to Discharge Comments: CAD, Diabetes, sacral fx, pain, HLD, HTN, constipation Possible Resolutions to [employer] Focus: pain medication adjustments, Laxatives and medications for consipation, follow HTN, medications, follow labs   Continued Need for Acute Rehabilitation Level of Care:  The patient requires daily medical management by a physician with specialized training in physical medicine and rehabilitation for the following reasons: Direction of a multidisciplinary physical rehabilitation program to maximize functional independence : Yes Medical management of patient stability for  increased activity during participation in an intensive rehabilitation regime.: Yes Analysis of laboratory values and/or radiology reports with any subsequent need for medication adjustment and/or medical intervention. : Yes   I attest that I was present, lead the team conference, and concur with the assessment and plan of the team.   Tennis Must 09/02/2021, 12:32 PM

## 2021-09-03 DIAGNOSIS — M25559 Pain in unspecified hip: Secondary | ICD-10-CM

## 2021-09-03 LAB — GLUCOSE, CAPILLARY
Glucose-Capillary: 122 mg/dL — ABNORMAL HIGH (ref 70–99)
Glucose-Capillary: 119 mg/dL — ABNORMAL HIGH (ref 70–99)
Glucose-Capillary: 113 mg/dL — ABNORMAL HIGH (ref 70–99)
Glucose-Capillary: 112 mg/dL — ABNORMAL HIGH (ref 70–99)

## 2021-09-03 MED ORDER — INSULIN ASPART 100 UNIT/ML IJ SOLN
0.0000 [IU] | Freq: Every day | INTRAMUSCULAR | Status: DC
  Administered 2021-09-05 – 2021-09-07 (×3): 1 [IU] via SUBCUTANEOUS

## 2021-09-03 NOTE — Progress Notes (Signed)
Patient ID: Mathew Oliver, male   DOB: 1960-07-25, 61 y.o.   MRN: [id number]  SW emailed DME order and HHA referral to [facility] (p:[phone]/f:[phone];[email or web address]).  Cecile Sheerer, MSW, LCSWA Office: [phone] Cell: [phone] Fax: [phone]

## 2021-09-03 NOTE — Progress Notes (Signed)
PROGRESS NOTE   Subjective/Complaints:  He had BM yesterday. Reports his pain is worst during PT session.   ROS - Neg CP, SOB, N/V/D + constipation-improved  Objective:   DG Abd Portable 1V  Result Date: 09/02/2021 CLINICAL DATA:  Constipation EXAM: PORTABLE ABDOMEN - 1 VIEW COMPARISON:  None Available. FINDINGS: Bowel gas pattern is nonspecific. Small amount of stool is seen in the colon. There is no fecal impaction in the rectosigmoid. No abnormal masses or calcifications are seen. Kidneys are partly obscured by bowel contents. Deformities are seen in the left lower ribs and left pubic bone without break in the cortical margins suggesting old healed fractures. IMPRESSION: Nonspecific bowel gas pattern. Small amount of stool is seen in the colon. There is no fecal impaction in the rectosigmoid. Electronically Signed   By: Ernie Avena M.D.   On: 09/02/2021 11:12   Recent Labs    09/01/21 0555  WBC 10.2  HGB 12.5*  HCT 37.4*  PLT 312    Recent Labs    09/01/21 0555  NA 139  K 4.3  CL 103  CO2 26  GLUCOSE 129*  BUN 20  CREATININE 0.67  CALCIUM 9.0     Intake/Output Summary (Last 24 hours) at 09/03/2021 5400 Last data filed at 09/03/2021 0753 Gross per 24 hour  Intake 580 ml  Output 1350 ml  Net -770 ml         Physical Exam: Vital Signs Blood pressure 128/71, pulse 62, temperature 97.8 F (36.6 C), resp. rate 18, height 6' (1.829 m), weight 132.2 kg, SpO2 96 %.   General: No acute distress Mood and affect are appropriate Heart: Regular rate and rhythm no rubs murmurs or extra sounds Lungs: Clear to auscultation, breathing unlabored, no rales or wheezes Abdomen: Positive bowel sounds, soft nontender to palpation, mildly distended Extremities: No clubbing, cyanosis, or edema Skin: No evidence of breakdown, no evidence of rash Neurologic: Cranial nerves II through XII intact, motor strength is 5/5  in RIght  deltoid, bicep, tricep, grip, Left grip 4/5 LUE not tested due to fracture, 3- hip flexor, knee extensors, 4/5 ankle dorsiflexor and plantar flexor (limited by Hip pain)   Musculoskeletal: limited range LUE and LEs due to pain /fractures    Assessment/Plan: 1. Functional deficits which require 3+ hours per day of interdisciplinary therapy in a comprehensive inpatient rehab setting. Physiatrist is providing close team supervision and 24 hour management of active medical problems listed below. Physiatrist and rehab team continue to assess barriers to discharge/monitor patient progress toward functional and medical goals  Care Tool:  Bathing    Body parts bathed by patient: Right arm, Left arm, Chest, Abdomen, Front perineal area, Right upper leg, Left upper leg, Face     Body parts n/a: Buttocks, Right lower leg, Left lower leg   Bathing assist Assist Level: Moderate Assistance - Patient 50 - 74%     Upper Body Dressing/Undressing Upper body dressing   What is the patient wearing?: Hospital gown only    Upper body assist Assist Level: Minimal Assistance - Patient > 75%    Lower Body Dressing/Undressing Lower body dressing    Lower body dressing  activity did not occur: N/A What is the patient wearing?: Pants     Lower body assist Assist for lower body dressing: Maximal Assistance - Patient 25 - 49%     Toileting Toileting    Toileting assist Assist for toileting: Independent with assistive device (urinal) Assistive Device Comment: urinal   Transfers Chair/bed transfer  Transfers assist  Chair/bed transfer activity did not occur: N/A  Chair/bed transfer assist level: Moderate Assistance - Patient 50 - 74%     Locomotion Ambulation   Ambulation assist      Assist level: Moderate Assistance - Patient 50 - 74% Assistive device: Parallel bars Max distance: 2 ft   Walk 10 feet activity   Assist  Walk 10 feet activity did not occur: Safety/medical  concerns        Walk 50 feet activity   Assist Walk 50 feet with 2 turns activity did not occur: Safety/medical concerns         Walk 150 feet activity   Assist Walk 150 feet activity did not occur: Safety/medical concerns         Walk 10 feet on uneven surface  activity   Assist Walk 10 feet on uneven surfaces activity did not occur: Safety/medical concerns         Wheelchair     Assist Is the patient using a wheelchair?: Yes Type of Wheelchair: Manual    Wheelchair assist level: Supervision/Verbal cueing Max wheelchair distance: 15 ft    Wheelchair 50 feet with 2 turns activity    Assist        Assist Level: Total Assistance - Patient < 25%   Wheelchair 150 feet activity     Assist      Assist Level: Total Assistance - Patient < 25%   Blood pressure 128/71, pulse 62, temperature 97.8 F (36.6 C), resp. rate 18, height 6' (1.829 m), weight 132.2 kg, SpO2 96 %.  Medical Problem List and Plan: 1. Functional deficits secondary to polytrauma with Left distal humerus Fx, Sacral/pubic rami FX             -patient may shower cover surgical incision             -ELOS/Goals: 6/30 Sup             -Continue CIR  -DC IV 2.  Antithrombotics: -DVT/anticoagulation:  Pharmaceutical: transition to Eliquis 2.5 mg BID X 30 day             -antiplatelet therapy: ASA 3. Pain Management: Will add Oxycontin for more consistent pain relief and d/c IV dilaudid            -6/20 Restart oxycodone, Asked if PT can be scheduled after his oxycodone dose at 12             --continue robaxin 4. Mood/Sleep: LCSW to follow for evaluation and support.              -antipsychotic agents: N/A 5. Neuropsych/cognition: This patient  is capable of making decisions on his own behalf. 6. Skin/Wound Care: Routine pressure relief measures. Monitor wound for healing.  7. Fluids/Electrolytes/Nutrition: Monitor I/O. Check CMET on Monday 8. Left distal humerus Fx s/p ORIF on  08/25/21: NWB LUE. Follow up X rays in 2 weeks --Sling for comfort.  9. CAD w/systolic CHF: EF 88-41%. Monitor for signs of overload. Check wts daily.  --Monitor for symptoms with increase in activity.              --  continue ASA, Coreg, Lipitor, aldactone and Zestril             -Heart healthy diet  -Denies chest pain 10. New diagnosis T2DM: Hgb A1C- 7.5.  --WIll start monitoring BS ac/hs and use SSI for elevated BS -Glucose has been stable, continue to monitor, consider change to daily CBGs  6/21 Change to daily CBG            CBG (last 3)  Recent Labs    09/02/21 1654 09/02/21 2111 09/03/21 0619  GLUCAP 109* 113* 122*     11. Sacral FX: WBAT 12. Acute blood loss anemia: Monitor for signs of bleeding. HGB 11.8             --6/20 HGB stable at 12.5 13. Morbid obesity-BMI 44: Encourage appropriate diet and activity for wt loss to promote mobility and overall  health.  14. Vitamin D deficiency:  On Ergocalciferol for supplement.  15. Constipation on colace and miralax -abd xray today- no signs of impaction - 34 oz in 8 ounces bid today  -6/21 Pt reports improved after BM yesterday 16.HLD on Lipitor 17 Acute hypoxic respiratory failure. Resolved. Due to CHF. Had lasix IV x2  18.  HTN Vitals:   09/02/21 2006 09/03/21 0549  BP: 102/66 128/71  Pulse: 68 62  Resp: 17 18  Temp: 98.1 F (36.7 C) 97.8 F (36.6 C)  SpO2: 93% 96%   Cont Coreg, Lisinopril, aldactone-6/19 18. Elevated LFTs  -6/20 Recheck CMP thursday LOS: 3 days A FACE TO FACE EVALUATION WAS PERFORMED  Jennye Boroughs 09/03/2021, 8:07 AM

## 2021-09-03 NOTE — Progress Notes (Signed)
Orthopaedic Trauma Progress Note  SUBJECTIVE: Doing fairly well this morning.  Has been making good progress with therapies.  Notes that getting up out of bed is becoming easier.  Still having pain and soreness in the pelvis as expected, left worse than right.  Elbow is sore from working with therapy yesterday.  Making improvements with his range of motion, but unable to reach all the way to his mouth.  No other issues of note.  Anticipated discharge date 09/12/2021  OBJECTIVE:  Vitals:   09/02/21 2006 09/03/21 0549  BP: 102/66 128/71  Pulse: 68 62  Resp: 17 18  Temp: 98.1 F (36.7 C) 97.8 F (36.6 C)  SpO2: 93% 96%    General: Laying in bed comfortably, no acute distress  Respiratory: No increased work of breathing.  LUE: Mepilex dressing clean, dry, intact.  Bruising over the elbow and throughout the upper arm has expected.  Swelling stable.  Flexion of the elbow but 105 degrees.  About 20 to 30 degrees shy of full elbow extension.  Hand warm and well-perfused.  Brisk cap refill LLE: Mildly tender with palpation over the hip.  Tolerates hip, knee, ankle motion.  Note some discomfort with motion of the hip but this is improving.  Ankle DF/PF intact.  Endorses sensation throughout extremity.  Neurovascularly intact  IMAGING: Stable post op imaging.    ASSESSMENT: Mathew Oliver is a 60 y.o. male s/p ORIF LEFT DISTAL HUMERUS FRACTURE 08/25/2021 NON-OP MANAGEMENT LEFT SIDED PELVIC FRACTURES  CV/Blood loss: Hemoglobin 12.5 on 09/01/21, stable.   PLAN: Weightbearing: NWB LUE, WBAT BLE ROM: Okay for unrestricted active/passive motion as tolerated LUE AND BLE Incisional and dressing care: Ok to leave LUE incision open to air Showering: Okay to shower/get incisions wet Orthopedic device(s):  Sling LUE for comfort   Pain management: continue current regimen VTE prophylaxis: Eliquis BID, SCDs Impediments to Fracture Healing: Vitamin D level 20, started on supplementation Dispo: Continue  care per CIR. Sutures LUE will need to be removed 09/08/21 if incision stable. Plan for repeat x-rays pelvis and L elbow early next week   D/C recommendations: - Eliquis 2.5 mg BID for an additional 14 days for DVT prophylaxis - Continue 1000 units daily Vit D supplementation x 90 days  Follow - up plan: 2 weeks after discharge from CIR for wound check and repeat x-rays   Contact information:  Truitt Merle MD, Thyra Breed PA-C. After hours and holidays please check [email or web address] for group call information for [facility]   Thompson Caul, PA-C [phone] (office) [email or web address]

## 2021-09-03 NOTE — IPOC Note (Signed)
Overall Plan of Care [facility]) Patient Details Name: Mathew Oliver MRN: [id number] DOB: Jul 07, 1960  Admitting Diagnosis: Trauma  Hospital Problems: Principal Problem:   Trauma     Functional Problem List: Nursing Bowel, Edema, Endurance, Medication Management, Motor, Pain, Safety, Skin Integrity  PT Balance, Pain, Perception, Edema, Safety, Endurance, Motor, Skin Integrity, Nutrition  OT Balance, Pain, Cognition, Safety, Endurance, Motor, Skin Integrity  SLP    TR         Basic ADL's: OT Bathing, Dressing, Toileting     Advanced  ADL's: OT       Transfers: PT Bed Mobility, Bed to Chair, Car, Occupational psychologist, Research scientist (life sciences): PT Ambulation, Psychologist, prison and probation services, Stairs     Additional Impairments: OT Fuctional Use of Upper Extremity  SLP        TR      Anticipated Outcomes Item Anticipated Outcome  Self Feeding Independent  Swallowing      Basic self-care  Marketing executive Transfers Supervision  Bowel/Bladder  supervision  Transfers  supervision using LRAD  Locomotion  mod I w/c level, CGA gait up to 25 feet using LRAD  Communication     Cognition     Pain  < 3  Safety/Judgment  supervision   Therapy Plan: PT Intensity: Minimum of 1-2 x/day ,45 to 90 minutes PT Frequency: 5 out of 7 days PT Duration Estimated Length of Stay: 1.5-2 weeks OT Intensity: Minimum of 1-2 x/day, 45 to 90 minutes OT Frequency: 5 out of 7 days OT Duration/Estimated Length of Stay: 10-14 days     Team Interventions: Nursing Interventions Patient/Family Education, Bowel Management, Disease Management/Prevention, Pain Management, Medication Management, Skin Care/Wound Management, Discharge Planning  PT interventions Ambulation/gait training, Discharge planning, DME/adaptive equipment instruction, Functional mobility training, Pain management, Splinting/orthotics, Psychosocial support, Therapeutic Activities, UE/LE Strength  taining/ROM, Wheelchair propulsion/positioning, UE/LE Coordination activities, Therapeutic Exercise, Stair training, Skin care/wound management, Patient/family education, Neuromuscular re-education, Functional electrical stimulation, Disease management/prevention, Firefighter, Warden/ranger  OT Interventions Warden/ranger, Community reintegration, Disease mangement/prevention, Development worker, international aid stimulation, Neuromuscular re-education, Equities trader education, Self Care/advanced ADL retraining, Splinting/orthotics, Therapeutic Exercise, UE/LE Coordination activities, Wheelchair propulsion/positioning, UE/LE Strength taining/ROM, Therapeutic Activities, Skin care/wound managment, Psychosocial support, Pain management, Functional mobility training, DME/adaptive equipment instruction, Discharge planning, Cognitive remediation/compensation, Visual/perceptual remediation/compensation  SLP Interventions    TR Interventions    SW/CM Interventions Discharge Planning, Psychosocial Support, Patient/Family Education   Barriers to Discharge MD  Medical stability, Home enviroment access/loayout, and Weight bearing restrictions  Nursing Decreased caregiver support, Home environment access/layout, Wound Care, Lack of/limited family support, Weight bearing restrictions 1 level mobile home, 1-2 steps, no rails. Spouse to provide 24/7 care.  PT Inaccessible home environment, Decreased caregiver support, Weight bearing restrictions    OT Home environment access/layout, Weight bearing restrictions    SLP      SW Decreased caregiver support, Lack of/limited family support     Team Discharge Planning: Destination: PT-Home ,OT- Home , SLP-  Projected Follow-up: PT-Home health PT, OT-  Home health OT, SLP-  Projected Equipment Needs: PT-To be determined, Wheelchair (measurements), Wheelchair cushion (measurements), OT- To be determined, SLP-  Equipment Details: PT-22"x18"  manual w/c with B ELRs, 22"x18" foam cushion, OT-  Patient/family involved in discharge planning: PT- Patient,  OT-Patient, SLP-   MD ELOS: 10-12 Medical Rehab Prognosis:  Excellent Assessment: The patient has been admitted for CIR therapies with the diagnosis of polytrauma with Left distal humerus Fx,  Sacral/pubic rami FX. The team will be addressing functional mobility, strength, stamina, balance, safety, adaptive techniques and equipment, self-care, bowel and bladder mgt, patient and caregiver education. Goals have been set at Supervision. Anticipated discharge destination is home.        See Team Conference Notes for weekly updates to the plan of care

## 2021-09-03 NOTE — Progress Notes (Signed)
Occupational Therapy Session Note  Patient Details  Name: Mathew Oliver MRN: [id number] Date of Birth: 29-Dec-1960  Session 1 Today's Date: 09/03/2021 OT Individual Time: 1224-4975 OT Individual Time Calculation (min): 78 min   Session 2  Today's Date: 09/03/2021 OT Individual Time: 1415-1510 OT Individual Time Calculation (min): 55 min   Short Term Goals: Week 1:  OT Short Term Goal 1 (Week 1): Pt will complete LB dressing with Mod A. OT Short Term Goal 2 (Week 1): Pt will complete toilet transfer with Min A. OT Short Term Goal 3 (Week 1): Pt will complete functional mobility with LRAD with Min A to increase independence in ADLs. OT Short Term Goal 4 (Week 1): Pt will complete UB/LB bathing with Min A.  Skilled Therapeutic Interventions/Progress Updates:    Session 1: Pt received supine in bed and agreeable to OT session. Reports 3/10 pain in LE but is feeling much better than yesterday. Requesting to terminate the IV. Pt completed bed mobility with CGA and sat EOB with no reports of dizziness/nausea. Pt stood with hemi walker and CGA and stand pivoted to w/c with verbal cues for positioning of walker and step pattern to step back to w/c. Pt showed increase endurance and strength. Transferred to toilet/BSC with stand pivot and Min A and cueing for placement. Pt voided BM on the toilet. Required max A to wipe bottom in standing. Pt took 5 steps forward from toilet to tub bench with Min A using the hemi walker. Showered UB with (S), with min A to clean back/bottom in standing. Following oral care at the sink pt used the hemi walker to transfer back to bed, 5 ft of functional mobility with min A, mod cueing for technique. Pt transferred back to supine and was left with alarm on, call bell in reach, and all needs met.    Session 2: Pt received supine in bed and agreeable to OT session. Pain reported but no number given. Bed mobility completed with CGA. Attempted to walk with hemi-walker but pt  refusing d/t pain. Stand pivot to w/c with 3-4 steps back. Pt increasingly stiff and verbalizing pain when stepping back with Min/Mod A for balance. Transported pt outside to address psychosocial adjustment to injury. Engaged in conversations related to follow up therapy after rehab stay, fears about returning home and being a burden, and general conversation about pt interests. Completed AROM of LUE with elbow ext/flex, horizontal abd/add, external rotation and shoulder flex/ext 10x each for carryover to ADL performance and maintaining joint flexibility. Finished tx with red thera-band on RUE for increase strength during ADL transfers. Completed elbow flex/ex and shoulder flex/ext 10x each. Transported back to room for time manage and left in w/c with chair alarm on, call bell in reach, and all needs met.   Therapy Documentation Precautions:  Precautions Precautions: Fall Required Braces or Orthoses: Sling (L upper extremity) Restrictions Weight Bearing Restrictions: Yes LUE Weight Bearing: Non weight bearing LLE Weight Bearing: Weight bearing as tolerated Other Position/Activity Restrictions: Sling was not on when OT entered the room. Pt supine in bed.  Therapy/Group: Individual Therapy  Catalina Lunger 09/03/2021, 7:33 AM

## 2021-09-03 NOTE — Progress Notes (Signed)
Physical Therapy Session Note  Patient Details  Name: Mathew Oliver MRN: [id number] Date of Birth: Dec 26, 1960  Today's Date: 09/03/2021 PT Individual Time: 1105-1200 PT Individual Time Calculation (min): 55 min   Short Term Goals: Week 1:  PT Short Term Goal 1 (Week 1): Patient will perform bed mobility with min A using home set-up. PT Short Term Goal 2 (Week 1): Patient will perform basic transfers with CGA using LRAD. PT Short Term Goal 3 (Week 1): Patient will ambulate >10 feet using LRAD. PT Short Term Goal 4 (Week 1): Patient will propel w/c >50 feet.  Skilled Therapeutic Interventions/Progress Updates:     Patient in bed upon PT arrival. Patient alert and agreeable to PT session. Patient reported 6-10/10 L pelvic pain during session, RN made aware and provided pain medications during session. PT provided repositioning, rest breaks, and distraction as pain interventions throughout session. Patient reported he called for meds 1 hour before session, but message was not relayed to the RN. Provided positive reinforcement for calling prior to therapy session and encouraged patient to continue to call if meds not provided in an appropriate amount of time. Patient and nursing in agreement.   Attempted OOB mobility, however, patient did not tolerate sitting or standing this session due to 10/10 pelvic pain. Focused remainder of session on pain management strategies, review of pain medications and frequency to plan med management for therapy sessions, and educated on non medication pain modalities.   Applied Kinesiotape over L posterio-lateral hip using lymphatic drainage technique with the aim of edema management. Prior to application, patient denied any history of skin irritation or allergy to adhesive. Educated patient on purpose of kinesiotape placement and signs symptoms of allergic reaction or irritation. Cleaned patient's skin and applied test strip at beginning of session and removed at  end of session without sings of skin irritation. Instructed patient that the tape can be left on up to 3 days and can be worn in the shower. Instructed to removed the tape if peeling off, signs of skin irritation arise, or it has been on for >3 days. Informed patient that the tape is best removed in the shower or with a wet wash cloth. Patient stated understanding. Rehab team informed about tape placement to assist with monitoring patient's response.  Therapeutic Activity: Bed Mobility: Patient performed supine to/from sit with min-mod A for trunk and lower extremity management. Provided verbal cues for progressing through side-lying and use of upper extremities to come to sitting. Patient with poor sitting tolerance EOB. Transfers: Patient performed a lateral scoot transfer bed<>w/c min A, patient again with poor sitting tolerance due to 10/10 pelvic pain and patient quickly returned to bed and lying.   Patient in bed with x2 ice packs applied to his L pelvic region for pain control at end of session with breaks locked, bed alarm set, and all needs within reach.   Therapy Documentation Precautions:  Precautions Precautions: Fall Required Braces or Orthoses: Sling (L upper extremity) Restrictions Weight Bearing Restrictions: Yes LUE Weight Bearing: Non weight bearing LLE Weight Bearing: Weight bearing as tolerated Other Position/Activity Restrictions: Sling was not on when OT entered the room. Pt supine in bed.    Therapy/Group: Individual Therapy  Shyvonne Chastang L Sima Lindenberger PT, DPT  09/03/2021, 12:56 PM

## 2021-09-04 LAB — COMPREHENSIVE METABOLIC PANEL
ALT: 48 U/L — ABNORMAL HIGH (ref 0–44)
AST: 39 U/L (ref 15–41)
Albumin: 3.1 g/dL — ABNORMAL LOW (ref 3.5–5.0)
Alkaline Phosphatase: 89 U/L (ref 38–126)
Anion gap: 10 (ref 5–15)
BUN: 34 mg/dL — ABNORMAL HIGH (ref 8–23)
CO2: 24 mmol/L (ref 22–32)
Calcium: 8.6 mg/dL — ABNORMAL LOW (ref 8.9–10.3)
Chloride: 103 mmol/L (ref 98–111)
Creatinine, Ser: 0.95 mg/dL (ref 0.61–1.24)
GFR, Estimated: >60 mL/min (ref >60)
Glucose, Bld: 112 mg/dL — ABNORMAL HIGH (ref 70–99)
Potassium: 3.9 mmol/L (ref 3.5–5.1)
Sodium: 137 mmol/L (ref 135–145)
Total Bilirubin: 0.8 mg/dL (ref 0.3–1.2)
Total Protein: 6.4 g/dL — ABNORMAL LOW (ref 6.5–8.1)

## 2021-09-04 LAB — GLUCOSE, CAPILLARY
Glucose-Capillary: 115 mg/dL — ABNORMAL HIGH (ref 70–99)
Glucose-Capillary: 102 mg/dL — ABNORMAL HIGH (ref 70–99)
Glucose-Capillary: 89 mg/dL (ref 70–99)
Glucose-Capillary: 138 mg/dL — ABNORMAL HIGH (ref 70–99)

## 2021-09-04 MED ORDER — SODIUM CHLORIDE 0.9 % IV SOLN
INTRAVENOUS | Status: DC
Start: 2021-09-04 — End: 2021-09-04

## 2021-09-04 MED ORDER — LISINOPRIL 20 MG PO TABS
20.0000 mg | ORAL_TABLET | Freq: Every evening | ORAL | Status: DC
  Administered 2021-09-05 – 2021-09-11 (×5): 20 mg via ORAL
  Filled 2021-09-04 (×7): qty 1

## 2021-09-04 MED ORDER — LISINOPRIL 20 MG PO TABS: 20.0000 mg | ORAL_TABLET | Freq: Every evening | ORAL | Status: DC

## 2021-09-04 MED ORDER — LISINOPRIL 20 MG PO TABS
40.0000 mg | ORAL_TABLET | Freq: Every evening | ORAL | Status: DC
  Administered 2021-09-04: 40 mg via ORAL
  Filled 2021-09-04: qty 2

## 2021-09-04 NOTE — Progress Notes (Signed)
Blood pressures remain low and fluid intake poor. Will hydrated with one liter IVF and decrease pm dose lisinopril to 20 mg daily--hold for sitting BP less than 120. Labs tomorrow.

## 2021-09-04 NOTE — Progress Notes (Signed)
Physical Therapy Session Note  Patient Details  Name: Mathew Oliver MRN: [id number] Date of Birth: 12/08/1960  Today's Date: 09/04/2021 PT Individual Time: 1302-1359 PT Individual Time Calculation (min): 57 min   Short Term Goals: Week 1:  PT Short Term Goal 1 (Week 1): Patient will perform bed mobility with min A using home set-up. PT Short Term Goal 2 (Week 1): Patient will perform basic transfers with CGA using LRAD. PT Short Term Goal 3 (Week 1): Patient will ambulate >10 feet using LRAD. PT Short Term Goal 4 (Week 1): Patient will propel w/c >50 feet.  Skilled Therapeutic Interventions/Progress Updates:     Pt received supine in bed and agrees to therapy. Reports pain in L hip. Number not provided. PT provides rest breaks as needed to manage pain. Pt performs supine to sit with cues for sequencing and use of bed features. PT re-educates pt on WB precautions. Pt performs sit to stand with hemiwalker with minA and stand step transfer to [facility] on the L with minA and cues for sequencing and positioning. WC transport to gym for time management. Pt performs sit to stand to high low table with hemiwalker and CGA. Pt cued to complete fine motor task with R upper extremity, stacking wooden blocks to make a tower. Activity performed to work on standing tolerance and balance, without upper extremity support. Pt completes tower stacking and takes rest break, then verbalizes some blurriness of vision. BP taken at 91/66. Pt stands and continues activity and after standing for about 1 minute, verbalizes increasing light headedness and "hot flash". PT cues to sit back down for rest break. BP 92/68.  Pt attempts ambulation, completing x5' with hemiwalker, with minA and cues for correct use of hemiwalker, as well as stepping pattern and safety awareness. Following seated rest break, pt ambulates additional 5'. WC transport back to room. Stand step transfer back to bed with hemiwalker and minA, with cues for  positioning. Sit to supine with minA management of L lower extremity. Left supine with alarm intact and all needs within reach.  Therapy Documentation Precautions:  Precautions Precautions: Fall Required Braces or Orthoses: Sling (L upper extremity) Restrictions Weight Bearing Restrictions: Yes LUE Weight Bearing: Non weight bearing LLE Weight Bearing: Weight bearing as tolerated Other Position/Activity Restrictions: Sling was not on when OT entered the room. Pt supine in bed.    Therapy/Group: Individual Therapy  Beau Fanny, PT, DPT 09/04/2021, 5:47 PM

## 2021-09-04 NOTE — Progress Notes (Signed)
Nutrition Follow-up  DOCUMENTATION CODES:   Not applicable  INTERVENTION:   Continue Multivitamin w/ minerals daily Discontinue Ensure Enlive  Diet education Liberalize pt diet to regular due to poor PO intake and increased needs.   NUTRITION DIAGNOSIS:   Inadequate oral intake related to poor appetite as evidenced by per patient/family report. - Progressing   GOAL:   Patient will meet greater than or equal to 90% of their needs - Ongoing   MONITOR:   PO intake, Labs, Skin  REASON FOR ASSESSMENT:   Consult Diet education  ASSESSMENT:   61 y.o. male admitted to CIR after falling from a ladder and fracturing his L humerus, sacrum, and pubic rami. PMH includes HTN, CHF, and HLD.  RD received a consult for diabetes education.   Pt reports that his PO intake has improved, states that his appetite is good. Denies any nausea or vomiting. States that he has not received any Ensure's.  Per EMR, pt PO intake includes: 6/20: Breakfast 25%, Lunch 100%, Dinner 95% 6/21: Breakfast 100%, Lunch 100%, Dinner 100% 6/22: Breakfast 25%   Pt denies any prior diagnosis of diabetes, states that no one has mentioned anything to him about it. RD reviewed pt CBG, CBG have been well controlled this admission and pt Hgb A1c 7.4%. RD to add "Plate Method for Diabetes" to pt AVS.   Medications reviewed and include: SSI 0-9 units daily, MVI, Miralax, Senokot, Spironolactone, Vitamin D (weekly) Labs reviewed: BUN 34, Vitamin D 20.84, 24 hr CBG 112-122  Diet Order:   Diet Order             Diet regular Room service appropriate? Yes; Fluid consistency: Thin  Diet effective now                  EDUCATION NEEDS:   Education needs have been addressed  Skin:  Skin Assessment: Reviewed RN Assessment  Last BM:  6/21  Height:  Ht Readings from Last 1 Encounters:  08/31/21 6' (1.829 m)   Weight:  Wt Readings from Last 1 Encounters:  09/04/21 133.3 kg   Ideal Body Weight:  80.9  kg  BMI:  Body mass index is 39.86 kg/m.  Estimated Nutritional Needs:  Kcal:  2300-2500 Protein:  115-130 grams Fluid:  >/= 2 L    Kirby Crigler RD, LDN Clinical Dietitian See [facility] for contact information.

## 2021-09-04 NOTE — Progress Notes (Signed)
Occupational Therapy Session Note  Patient Details  Name: Mathew Oliver MRN: [id number] Date of Birth: 10/17/1960  Today's Date: 09/04/2021 OT Individual Time: 1020-1050 OT Individual Time Calculation (min): 30 min    Short Term Goals: Week 1:  OT Short Term Goal 1 (Week 1): Pt will complete LB dressing with Mod A. OT Short Term Goal 2 (Week 1): Pt will complete toilet transfer with Min A. OT Short Term Goal 3 (Week 1): Pt will complete functional mobility with LRAD with Min A to increase independence in ADLs. OT Short Term Goal 4 (Week 1): Pt will complete UB/LB bathing with Min A.  Skilled Therapeutic Interventions/Progress Updates:     Pt received semi-reclined in bed, reports generalized pain but is premedicated, agreeable to therapy. Session focus on activity tolerance, BLE strengthening in prep for improved ADL/IADL/func mobility performance + decreased caregiver burden.   Per OT/PT in earlier sessions, pt orthostatic. Thigh high ted hose already donned, but abdominal binder not yet delivered to room. Total A to don LUE sling. Comes to sitting EOB with use of RUE on bed rail and CGA/increased time.     Orthostatic Vital Signs  Orthostatic Lying   BP- Lying 117/72 (86)  Orthostatic Sitting  BP- Sitting 99/70 (79) - but recovers to 106/73 (84) after several minutes  Orthostatic Standing at 0 minutes  BP- Standing at 0 minutes 93/64 (74)    Seated EOB completed 5 B seated marches and B knee extensions. Completed 4x30 seconds bouts of B kinetron with rest break in between bouts.  Stand-pivot to and from bed with use of HW and bed rail with CGA, mod A to progress BLE back onto bed. Difficulty WB BLE due to increase in hip pain.  Pt left semi-reclined in bed with bed alarm engaged, call bell in reach, and all immediate needs met.   Therapy Documentation Precautions:  Precautions Precautions: Fall Required Braces or Orthoses: Sling (L upper extremity) Restrictions Weight  Bearing Restrictions: Yes LUE Weight Bearing: Non weight bearing LLE Weight Bearing: Weight bearing as tolerated Other Position/Activity Restrictions: Sling was not on when OT entered the room. Pt supine in bed.  Pain: Pain Assessment Pain Scale: 0-10 Pain Score: 8  Pain Type: Acute pain Pain Location: Generalized Pain Descriptors / Indicators: Aching Pain Frequency: Constant Pain Onset: On-going Pain Intervention(s): Medication (See eMAR) ADL: See Care Tool for more details.  Therapy/Group: Individual Therapy  Volanda Napoleon MS, OTR/L  09/04/2021, 10:54 AM

## 2021-09-05 DIAGNOSIS — I9589 Other hypotension: Secondary | ICD-10-CM

## 2021-09-05 DIAGNOSIS — I10 Essential (primary) hypertension: Secondary | ICD-10-CM

## 2021-09-05 DIAGNOSIS — E861 Hypovolemia: Secondary | ICD-10-CM

## 2021-09-05 LAB — GLUCOSE, CAPILLARY
Glucose-Capillary: 137 mg/dL — ABNORMAL HIGH (ref 70–99)
Glucose-Capillary: 90 mg/dL (ref 70–99)
Glucose-Capillary: 80 mg/dL (ref 70–99)
Glucose-Capillary: 142 mg/dL — ABNORMAL HIGH (ref 70–99)

## 2021-09-05 LAB — BASIC METABOLIC PANEL
Anion gap: 7 (ref 5–15)
BUN: 26 mg/dL — ABNORMAL HIGH (ref 8–23)
CO2: 27 mmol/L (ref 22–32)
Calcium: 8.8 mg/dL — ABNORMAL LOW (ref 8.9–10.3)
Chloride: 102 mmol/L (ref 98–111)
Creatinine, Ser: 0.86 mg/dL (ref 0.61–1.24)
GFR, Estimated: >60 mL/min (ref >60)
Glucose, Bld: 146 mg/dL — ABNORMAL HIGH (ref 70–99)
Potassium: 4.5 mmol/L (ref 3.5–5.1)
Sodium: 136 mmol/L (ref 135–145)

## 2021-09-05 MED ORDER — POLYETHYLENE GLYCOL 3350 17 G PO PACK: 17.0000 g | PACK | Freq: Every day | ORAL | Status: DC | PRN

## 2021-09-05 MED ORDER — SORBITOL 70 % SOLN
45.0000 mL | Freq: Every day | Status: DC | PRN
Start: 2021-09-05 — End: 2021-09-12
  Filled 2021-09-05: qty 60

## 2021-09-05 MED ORDER — SPIRONOLACTONE 12.5 MG HALF TABLET
12.5000 mg | ORAL_TABLET | Freq: Every evening | ORAL | Status: DC
  Administered 2021-09-05 – 2021-09-11 (×6): 12.5 mg via ORAL
  Filled 2021-09-05 (×8): qty 1

## 2021-09-05 MED ORDER — SODIUM CHLORIDE 0.9 % IV SOLN
INTRAVENOUS | Status: DC
Start: 2021-09-05 — End: 2021-09-06

## 2021-09-05 NOTE — Progress Notes (Signed)
Occupational Therapy Session Note  Patient Details  Name: Mathew Oliver MRN: [id number] Date of Birth: [birth year]  Session 1 Today's Date: 09/05/2021 OT Individual Time: 1914-7829 OT Individual Time Calculation (min): 55 min   Session 2 Today's Date: 09/05/2021 OT Individual Time: 5621-3086 OT Individual Time Calculation (min): 58 min    Short Term Goals: Week 1:  OT Short Term Goal 1 (Week 1): Pt will complete LB dressing with Mod A. OT Short Term Goal 2 (Week 1): Pt will complete toilet transfer with Min A. OT Short Term Goal 3 (Week 1): Pt will complete functional mobility with LRAD with Min A to increase independence in ADLs. OT Short Term Goal 4 (Week 1): Pt will complete UB/LB bathing with Min A.  Skilled Therapeutic Interventions/Progress Updates:    Session 1: Pt received supine in bed and agreeable to OT session. Completed EOB BADLs including oral care, face washing, and deodorant application with set up assist while assessing hemodynamic stability.   Supine- 102/70 (80) Sitting EOB- 106/66 (80) Standing- 77/64 (70) Sitting EOB- 94/66 (76)  Sitting after 5 minutes- 84/69 (76)  Pt returned to supine d/t BP not leveling out EOB- Charity fundraiser and PA notified. Certified RN beginning IV fluids during session. Completed therapeutic exercise of the LUE. Elbow flexion/extension and hand squeezes to maintain/increase muscle strength and flexibility. Pt left supine in bed with alarm on, call bell in reach, and all needs met.  Session 2: Pt received supine in bed and agreeable to OT session. Reports pain in L hip. Nurse Josh in the room assessing urine output and IV started. Abdominal binder, sling, and ace wrap donned prior to mobility to promote hemodynamic stability.  BP assessed in multiple positions throughout session: Supine EOB 111/68 (80) Seated in w/c 91/67 (76) Seated in w/c 104/76 (86) Sitting at sink after restroom 102/76 (85)  Pt completed stand pivot transfer to w/c with  CGA. Transported to the restroom and pt able to void on the toilet. Mod A required for hygiene. Completed oral/hand hygiene at the sink with set up assist. Functional mobility in the room for 8 ft at [facility]. No visual/verbal signs of OH. Pt demonstrated good use of the hemi-walker. Doffed ace wrap and ted hose. Pt left supine in bed with alarm on, call bell in reach, and all needs met.  Therapy Documentation Precautions:  Precautions Precautions: Fall Required Braces or Orthoses: Sling (L upper extremity) Restrictions Weight Bearing Restrictions: Yes LUE Weight Bearing: Non weight bearing LLE Weight Bearing: Weight bearing as tolerated Other Position/Activity Restrictions: Sling was not on when OT entered the room. Pt supine in bed.  Therapy/Group: Individual Therapy  Marny Lowenstein 09/05/2021, 7:17 AM

## 2021-09-06 LAB — GLUCOSE, CAPILLARY
Glucose-Capillary: 122 mg/dL — ABNORMAL HIGH (ref 70–99)
Glucose-Capillary: 86 mg/dL (ref 70–99)
Glucose-Capillary: 169 mg/dL — ABNORMAL HIGH (ref 70–99)
Glucose-Capillary: 91 mg/dL (ref 70–99)

## 2021-09-06 MED ORDER — SODIUM CHLORIDE 0.9 % IV SOLN
INTRAVENOUS | Status: DC
Start: 2021-09-06 — End: 2021-09-08

## 2021-09-06 NOTE — Progress Notes (Signed)
PROGRESS NOTE   Subjective/Complaints:  No dizziness when OOB this am   ROS - Neg CP, SOB, N/V/D,cough  + constipation-improved  Objective:   No results found. No results for input(s): "WBC", "HGB", "HCT", "PLT" in the last 72 hours.  Recent Labs    09/04/21 0541 09/05/21 0506  NA 137 136  K 3.9 4.5  CL 103 102  CO2 24 27  GLUCOSE 112* 146*  BUN 34* 26*  CREATININE 0.95 0.86  CALCIUM 8.6* 8.8*     Intake/Output Summary (Last 24 hours) at 09/06/2021 1114 Last data filed at 09/06/2021 0802 Gross per 24 hour  Intake 2267.91 ml  Output 1675 ml  Net 592.91 ml         Physical Exam: Vital Signs Blood pressure 133/86, pulse 64, temperature 98 F (36.7 C), temperature source Oral, resp. rate 18, height 6' (1.829 m), weight 134.5 kg, SpO2 96 %.   General: No acute distress Mood and affect are appropriate Heart: Regular rate and rhythm no rubs murmurs or extra sounds Lungs: Clear to auscultation, breathing unlabored, no rales or wheezes Abdomen: Positive bowel sounds, soft nontender to palpation, mildly distended Extremities: No clubbing, cyanosis, or edema Skin: No evidence of breakdown, no evidence of rash Neurologic: Cranial nerves II through XII intact, motor strength is 5/5 in RIght  deltoid, bicep, tricep, grip, Left grip 4/5 LUE not tested due to fracture, 3- hip flexor, knee extensors, 4/5 ankle dorsiflexor and plantar flexor (limited by Hip pain)   Musculoskeletal: limited range LUE and LEs due to pain /fractures    Assessment/Plan: 1. Functional deficits which require 3+ hours per day of interdisciplinary therapy in a comprehensive inpatient rehab setting. Physiatrist is providing close team supervision and 24 hour management of active medical problems listed below. Physiatrist and rehab team continue to assess barriers to discharge/monitor patient progress toward functional and medical goals  Care  Tool:  Bathing    Body parts bathed by patient: Right arm, Left arm, Chest, Abdomen, Front perineal area, Right upper leg, Left upper leg, Face   Body parts bathed by helper: Buttocks, Right lower leg, Left lower leg Body parts n/a: Buttocks, Right lower leg, Left lower leg   Bathing assist Assist Level: Minimal Assistance - Patient > 75%     Upper Body Dressing/Undressing Upper body dressing   What is the patient wearing?: Hospital gown only    Upper body assist Assist Level: Contact Guard/Touching assist    Lower Body Dressing/Undressing Lower body dressing      What is the patient wearing?: Pants     Lower body assist Assist for lower body dressing: Moderate Assistance - Patient 50 - 74%     Toileting Toileting    Toileting assist Assist for toileting: Independent with assistive device (urinal) Assistive Device Comment: urinal   Transfers Chair/bed transfer  Transfers assist     Chair/bed transfer assist level: Moderate Assistance - Patient 50 - 74%     Locomotion Ambulation   Ambulation assist      Assist level: Moderate Assistance - Patient 50 - 74% Assistive device: Parallel bars Max distance: 2 ft   Walk 10 feet activity  Assist  Walk 10 feet activity did not occur: Safety/medical concerns        Walk 50 feet activity   Assist Walk 50 feet with 2 turns activity did not occur: Safety/medical concerns         Walk 150 feet activity   Assist Walk 150 feet activity did not occur: Safety/medical concerns         Walk 10 feet on uneven surface  activity   Assist Walk 10 feet on uneven surfaces activity did not occur: Safety/medical concerns         Wheelchair     Assist Is the patient using a wheelchair?: Yes Type of Wheelchair: Manual    Wheelchair assist level: Supervision/Verbal cueing Max wheelchair distance: 15 ft    Wheelchair 50 feet with 2 turns activity    Assist        Assist Level: Total  Assistance - Patient < 25%   Wheelchair 150 feet activity     Assist      Assist Level: Total Assistance - Patient < 25%   Blood pressure 133/86, pulse 64, temperature 98 F (36.7 C), temperature source Oral, resp. rate 18, height 6' (1.829 m), weight 134.5 kg, SpO2 96 %.  Medical Problem List and Plan: 1. Functional deficits secondary to polytrauma with Left distal humerus Fx, Sacral/pubic rami FX             -patient may shower cover surgical incision             -ELOS/Goals: 6/30 Sup             -Continue CIR  -IV fluids for orthostatic hypotension 2.  Antithrombotics: -DVT/anticoagulation:  Pharmaceutical: transition to Eliquis 2.5 mg BID X 30 day             -antiplatelet therapy: ASA 3. Pain Management: Will add Oxycontin for more consistent pain relief and d/c IV dilaudid            -6/20 Restart oxycodone, Asked if PT can be scheduled after his oxycodone dose at 12             --continue robaxin 4. Mood/Sleep: LCSW to follow for evaluation and support.              -antipsychotic agents: N/A 5. Neuropsych/cognition: This patient  is capable of making decisions on his own behalf. 6. Skin/Wound Care: Routine pressure relief measures. Monitor wound for healing.  7. Fluids/Electrolytes/Nutrition: Monitor I/O. Check CMET on Monday 8. Left distal humerus Fx s/p ORIF on 08/25/21: NWB LUE. Follow up X rays in 2 weeks --Sling for comfort.  9. CAD w/systolic CHF: EF 16-10%. Monitor for signs of overload. Check wts daily.  --Monitor for symptoms with increase in activity.              --continue ASA, Coreg, Lipitor, aldactone and Zestril             -Heart healthy diet 10. New diagnosis T2DM: Hgb A1C- 7.5.  --WIll start monitoring BS ac/hs and use SSI for elevated BS -Glucose has been stable, continue to monitor, consider change to daily CBGs  6/21 Change to daily CBG  6/23 continues to be well controlled, consider stopping CBGs            CBG (last 3)  Recent Labs     09/05/21 1647 09/05/21 2111 09/06/21 0558  GLUCAP 80 142* 122*     11. Sacral FX: WBAT 12. Acute blood loss anemia:  Monitor for signs of bleeding. HGB 11.8             --6/20 HGB stable at 12.5 13. Morbid obesity-BMI 44: Encourage appropriate diet and activity for wt loss to promote mobility and overall  health.  14. Vitamin D deficiency:  On Ergocalciferol for supplement.  15. Constipation on Oliver and miralax -abd xray today- no signs of impaction - 34 oz in 8 ounces bid today  -6/21 Pt reports improved after BM yesterday 16.HLD on Lipitor 17 Acute hypoxic respiratory failure. Resolved. Due to CHF. Had lasix IV x2  18.  HTN Vitals:   09/05/21 2001 09/06/21 0506  BP: 106/66 133/86  Pulse: 70 64  Resp: 18 18  Temp: 98.3 F (36.8 C) 98 F (36.7 C)  SpO2: 91% 96%  Cont Coreg, Lisinopril, aldactone-6/19 6/23 Lisinopril decreased to 20mg  daily, spironolactone 12.5mg  18. Elevated LFTs  -6/20 Recheck CMP Thursday  -6/22 improving ALT 48 19. Orthostatic hypotension  -Abdominal binder ordered  -IV fluids seem to be helping ,  20. Azotemia BUN elevated  -6/23 improves with BUN 26 and Cr 0.86, Encourage oral fluids, NS at 163ml/hr      Latest Ref Rng & Units 09/05/2021    5:06 AM 09/04/2021    5:41 AM 09/01/2021    5:55 AM  BMP  Glucose 70 - 99 mg/dL 161  096  045   BUN 8 - 23 mg/dL 26  34  20   Creatinine 0.61 - 1.24 mg/dL 4.09  8.11  9.14   Sodium 135 - 145 mmol/L 136  137  139   Potassium 3.5 - 5.1 mmol/L 4.5  3.9  4.3   Chloride 98 - 111 mmol/L 102  103  103   CO2 22 - 32 mmol/L 27  24  26    Calcium 8.9 - 10.3 mg/dL 8.8  8.6  9.0    Will run at night only x 2 more nights , today did not feel any dizziness when he got OOB  LOS: 6 days A FACE TO FACE EVALUATION WAS PERFORMED  Mathew Oliver 09/06/2021, 11:14 AM

## 2021-09-06 NOTE — Progress Notes (Signed)
Physical Therapy Session Note  Patient Details  Name: Mathew Oliver MRN: [id number] Date of Birth: 07/23/1960  Today's Date: 09/06/2021 PT Individual Time: 0806-0850 PT Individual Time Calculation (min): 44 min   Short Term Goals: Week 1:  PT Short Term Goal 1 (Week 1): Patient will perform bed mobility with min A using home set-up. PT Short Term Goal 2 (Week 1): Patient will perform basic transfers with CGA using LRAD. PT Short Term Goal 3 (Week 1): Patient will ambulate >10 feet using LRAD. PT Short Term Goal 4 (Week 1): Patient will propel w/c >50 feet.   Skilled Therapeutic Interventions/Progress Updates:  Patient supine in bed on entrance to room. Patient alert and agreeable to PT session. TED hose donned at start of session with MaxA for time.   Patient with no pain complaint throughout session.  Therapeutic Activity: Bed Mobility: Patient performed supine --> sit with extra time and MinA for UB to seated position. Hesitant to move BLE for fear of sharp increase in pain in pelvis. VC/ tc required for technique in log rolling.  Transfers: Patient performed sit<>stand and stand pivot transfers throughout session with CGA and elevated bed surface. Provided verbal cues for technique and forward lean to pain toelrance. Toilet transfer with CGA using w/c to good position. Requires MaxA for pericare, and doffing/ donning LE clothing.   Orthostatic vitals checked from supine to sitting to standing. Initially, pt's BP taken in supine with reading 127/72 (90), pulse 67. In seated position, BP is 130/ 80 (90) with pulse 72. Then shortly after standing, taken again with reading at 121/74 (89) and pulse 69.   Gait Training:  Patient ambulated 5' x1/ 8' x1 using HW with CGA. Demonstrated difficulty initially with move of LLE. Reminded pt to attempt TTWB technique with LLE ahead of RLE for decrease in pain. . Provided vc/ tc for technique throughout. .  Patient supine  in bed at end of session  with brakes locked, bed alarm set, and all needs within reach.   Therapy Documentation Precautions:  Precautions Precautions: Fall Required Braces or Orthoses: Sling (L upper extremity) Restrictions Weight Bearing Restrictions: Yes LUE Weight Bearing: Non weight bearing LLE Weight Bearing: Weight bearing as tolerated Other Position/Activity Restrictions: Sling was not on when OT entered the room. Pt supine in bed. General:   Vital Signs: Therapy Vitals Temp: 98.3 F (36.8 C) Temp Source: Oral Pulse Rate: 64 Resp: 19 BP: 106/63 Patient Position (if appropriate): Lying Oxygen Therapy SpO2: 95 % O2 Device: Room Air Pain:  Moderate to instances of severe pain at times. Addressed with reconditioning.   Therapy/Group: Individual Therapy  Loel Dubonnet PT, DPT, CSRS 09/06/2021, 3:12 PM

## 2021-09-07 LAB — GLUCOSE, CAPILLARY
Glucose-Capillary: 122 mg/dL — ABNORMAL HIGH (ref 70–99)
Glucose-Capillary: 98 mg/dL (ref 70–99)
Glucose-Capillary: 142 mg/dL — ABNORMAL HIGH (ref 70–99)

## 2021-09-08 ENCOUNTER — Inpatient Hospital Stay (HOSPITAL_COMMUNITY): Payer: Self-pay

## 2021-09-08 DIAGNOSIS — E669 Obesity, unspecified: Secondary | ICD-10-CM

## 2021-09-08 DIAGNOSIS — E1169 Type 2 diabetes mellitus with other specified complication: Secondary | ICD-10-CM

## 2021-09-08 LAB — BASIC METABOLIC PANEL
Anion gap: 6 (ref 5–15)
BUN: 11 mg/dL (ref 8–23)
CO2: 28 mmol/L (ref 22–32)
Calcium: 8.8 mg/dL — ABNORMAL LOW (ref 8.9–10.3)
Chloride: 102 mmol/L (ref 98–111)
Creatinine, Ser: 0.71 mg/dL (ref 0.61–1.24)
GFR, Estimated: >60 mL/min (ref >60)
Glucose, Bld: 113 mg/dL — ABNORMAL HIGH (ref 70–99)
Potassium: 4.4 mmol/L (ref 3.5–5.1)
Sodium: 136 mmol/L (ref 135–145)

## 2021-09-08 LAB — CBC
HCT: 37 % — ABNORMAL LOW (ref 39.0–52.0)
Hemoglobin: 12.2 g/dL — ABNORMAL LOW (ref 13.0–17.0)
MCH: 31.4 pg (ref 26.0–34.0)
MCHC: 33 g/dL (ref 30.0–36.0)
MCV: 95.4 fL (ref 80.0–100.0)
Platelets: 315 10*3/uL (ref 150–400)
RBC: 3.88 MIL/uL — ABNORMAL LOW (ref 4.22–5.81)
RDW: 12.4 % (ref 11.5–15.5)
WBC: 9 10*3/uL (ref 4.0–10.5)
nRBC: 0 % (ref 0.0–0.2)

## 2021-09-08 LAB — GLUCOSE, CAPILLARY
Glucose-Capillary: 114 mg/dL — ABNORMAL HIGH (ref 70–99)
Glucose-Capillary: 78 mg/dL (ref 70–99)
Glucose-Capillary: 78 mg/dL (ref 70–99)

## 2021-09-08 MED ORDER — METHOCARBAMOL 500 MG PO TABS
500.0000 mg | ORAL_TABLET | Freq: Four times a day (QID) | ORAL | Status: DC | PRN
  Administered 2021-09-08 – 2021-09-11 (×6): 500 mg via ORAL
  Filled 2021-09-08 (×6): qty 1

## 2021-09-08 MED ORDER — METHOCARBAMOL 1000 MG/10ML IJ SOLN: 500.0000 mg | Freq: Four times a day (QID) | INTRAVENOUS | Status: DC | PRN

## 2021-09-08 NOTE — Progress Notes (Addendum)
PROGRESS NOTE   Subjective/Complaints: Still having hip pain and left arm discomfort. Had a bit of a restless night d/t pain.   ROS: Patient denies fever, rash, sore throat, blurred vision, dizziness, nausea, vomiting, diarrhea, cough, shortness of breath or chest pain,  neck pain, headache, or mood change.   Objective:   DG Elbow 2 Views Left  Result Date: 09/08/2021 CLINICAL DATA:  13244 fracture follow-up study EXAM: LEFT ELBOW - 2 VIEW COMPARISON:  August 25, 2021 FINDINGS: Again seen are the ORIF changes with plate and screw devices transfixing the comminuted fracture of the distal humerus. There are lucencies seen at the fractured medial aspect of the distal humerus. There is mild callus formation seen at the fracture sites. There is a screw device transfixing the fractured olecranon of ulna. There is widening seen at the fractured site of the olecranon that was not demonstrated on the previous study. Small bony fragments at the distal volar aspect of the humerus and at the proximal dorsal aspect of the ulna. There is no evidence of arthropathy or other focal bone abnormality. Mild soft tissue swelling. IMPRESSION: Post ORIF changes with comminuted fracture of the distal humerus. There are linear lucencies seen at the fractured medial aspect of the distal humerus. There is approximately 3 mm widening seen at the fractured olecranon that was not demonstrated on the previous study. Electronically Signed   By: Marjo Bicker M.D.   On: 09/08/2021 08:32   DG Pelvis Comp Min 3V  Result Date: 09/08/2021 CLINICAL DATA:  Pelvic fracture follow-up. EXAM: JUDET PELVIS - 3+ VIEW COMPARISON:  Pelvis x-rays and CT abdomen pelvis dated August 23, 2021. FINDINGS: Similar-appearing nondisplaced fractures of the left superior and inferior pubic rami and left sacral ala. The pubic symphysis and sacroiliac joints remain intact. No new fracture. Hip joint spaces  are relatively preserved. Osteopenia. Soft tissues are unremarkable. IMPRESSION: 1. Similar-appearing nondisplaced fractures of the left pubic rami and left sacral ala. Electronically Signed   By: Obie Dredge M.D.   On: 09/08/2021 08:11   Recent Labs    09/08/21 0614  WBC 9.0  HGB 12.2*  HCT 37.0*  PLT 315   Recent Labs    09/08/21 0614  NA 136  K 4.4  CL 102  CO2 28  GLUCOSE 113*  BUN 11  CREATININE 0.71  CALCIUM 8.8*    Intake/Output Summary (Last 24 hours) at 09/08/2021 0936 Last data filed at 09/08/2021 0459 Gross per 24 hour  Intake 1654.84 ml  Output 1900 ml  Net -245.16 ml        Physical Exam: Vital Signs Blood pressure 139/85, pulse 69, temperature 97.6 F (36.4 C), temperature source Oral, resp. rate 16, height 6' (1.829 m), weight 134.1 kg, SpO2 97 %.   Constitutional: No distress . Vital signs reviewed. HEENT: NCAT, EOMI, oral membranes moist Neck: supple Cardiovascular: RRR without murmur. No JVD    Respiratory/Chest: CTA Bilaterally without wheezes or rales. Normal effort    GI/Abdomen: BS +, non-tender, non-distended Ext: no clubbing, cyanosis, or edema Psych: pleasant and cooperative  Skin: No evidence of breakdown, no evidence of rash. Sutures/incision CDI LUE Neurologic: Cranial nerves II  through XII intact, motor strength is 5/5 in RIght  deltoid, bicep, tricep, grip, Left grip 4/5 LUE not tested due to fracture, 3- hip flexor, knee extensors, 4/5 ankle dorsiflexor and plantar flexors  Musculoskeletal: limited range LUE and LEs due to pain /fractures    Assessment/Plan: 1. Functional deficits which require 3+ hours per day of interdisciplinary therapy in a comprehensive inpatient rehab setting. Physiatrist is providing close team supervision and 24 hour management of active medical problems listed below. Physiatrist and rehab team continue to assess barriers to discharge/monitor patient progress toward functional and medical goals  Care  Tool:  Bathing    Body parts bathed by patient: Right arm, Left arm, Chest, Abdomen, Front perineal area, Right upper leg, Left upper leg, Face, Buttocks, Right lower leg, Left lower leg   Body parts bathed by helper: Buttocks, Right lower leg, Left lower leg Body parts n/a: Buttocks, Right lower leg, Left lower leg   Bathing assist Assist Level: Supervision/Verbal cueing     Upper Body Dressing/Undressing Upper body dressing   What is the patient wearing?: Pull over shirt    Upper body assist Assist Level: Supervision/Verbal cueing    Lower Body Dressing/Undressing Lower body dressing      What is the patient wearing?: Pants, Incontinence brief     Lower body assist Assist for lower body dressing: Contact Guard/Touching assist     Toileting Toileting    Toileting assist Assist for toileting: Independent with assistive device (urinal) Assistive Device Comment: urinal   Transfers Chair/bed transfer  Transfers assist     Chair/bed transfer assist level: Moderate Assistance - Patient 50 - 74%     Locomotion Ambulation   Ambulation assist      Assist level: Moderate Assistance - Patient 50 - 74% Assistive device: Parallel bars Max distance: 2 ft   Walk 10 feet activity   Assist  Walk 10 feet activity did not occur: Safety/medical concerns        Walk 50 feet activity   Assist Walk 50 feet with 2 turns activity did not occur: Safety/medical concerns         Walk 150 feet activity   Assist Walk 150 feet activity did not occur: Safety/medical concerns         Walk 10 feet on uneven surface  activity   Assist Walk 10 feet on uneven surfaces activity did not occur: Safety/medical concerns         Wheelchair     Assist Is the patient using a wheelchair?: Yes Type of Wheelchair: Manual    Wheelchair assist level: Supervision/Verbal cueing Max wheelchair distance: 15 ft    Wheelchair 50 feet with 2 turns  activity    Assist        Assist Level: Total Assistance - Patient < 25%   Wheelchair 150 feet activity     Assist      Assist Level: Total Assistance - Patient < 25%   Blood pressure 139/85, pulse 69, temperature 97.6 F (36.4 C), temperature source Oral, resp. rate 16, height 6' (1.829 m), weight 134.1 kg, SpO2 97 %.  Medical Problem List and Plan: 1. Functional deficits secondary to polytrauma with Left distal humerus Fx, Sacral/pubic rami FX             -patient may shower               -ELOS/Goals: 6/30 Sup             -Continue CIR  therapies including PT, OT     2.  Antithrombotics: -DVT/anticoagulation:  Pharmaceutical: transition to Eliquis 2.5 mg BID X 30 day             -antiplatelet therapy: ASA 3. Pain Management: added Oxycontin for more consistent pain relief and d/c IV dilaudid            -6/20 Restarted oxycodone, Asked if PT can be scheduled after his oxycodone dose at 12             --continue robaxin 4. Mood/Sleep: LCSW to follow for evaluation and support.              -antipsychotic agents: N/A 5. Neuropsych/cognition: This patient  is capable of making decisions on his own behalf. 6. Skin/Wound Care: Routine pressure relief measures. Monitor wound for healing.  7. Fluids/Electrolytes/Nutrition: Monitor I/O. Check CMET on Monday 8. Left distal humerus Fx s/p ORIF on 08/25/21: NWB LUE. Follow up X rays in 2 weeks --Sling for comfort.  6/26 there is olecranon displacement which wasn't present previously on today's xrays---will reach out to ortho 9. CAD w/systolic CHF: EF 16-10%. Monitor for signs of overload. Check wts daily.  --Monitor for symptoms with increase in activity.              --continue ASA, Coreg, Lipitor, aldactone and Zestril             -Heart healthy diet 10. New diagnosis T2DM: Hgb A1C- 7.5.  --WIll start monitoring BS ac/hs and use SSI for elevated BS -well controlled 6/26            CBG (last 3)  Recent Labs     09/07/21 1130 09/07/21 1656 09/08/21 0612  GLUCAP 98 142* 114*    11. Sacral FX: WBAT 12. Acute blood loss anemia: Monitor for signs of bleeding. HGB 11.8             --6/26 HGB stable at 12.2 13. Morbid obesity-BMI 44: Encourage appropriate diet and activity for wt loss to promote mobility and overall  health.  14. Vitamin D deficiency:  On Ergocalciferol for supplement.  15. Constipation on colace and miralax -moving bowels 16.HLD on Lipitor 17 Acute hypoxic respiratory failure. Resolved. Due to CHF. Had lasix IV x2  18.  HTN Vitals:   09/07/21 1958 09/08/21 0446  BP: (!) 142/87 139/85  Pulse: 65 69  Resp: (!) 21 16  Temp: 98.3 F (36.8 C) 97.6 F (36.4 C)  SpO2: 98% 97%  Cont Coreg, Lisinopril, aldactone-6/19 6/26 Lisinopril  20mg  daily, spironolactone 12.5mg  18. Elevated LFTs  -6/20 Recheck CMP Thursday  -6/22 improving ALT 48 19. Orthostatic hypotension  -Abdominal binder ordered  -dc HS IVF 20. Azotemia BUN elevated  -626 resolved. DC IVF  -encourage pO    LOS: 8 days A FACE TO FACE EVALUATION WAS PERFORMED  Ranelle Oyster 09/08/2021, 9:36 AM

## 2021-09-09 LAB — GLUCOSE, CAPILLARY: Glucose-Capillary: 116 mg/dL — ABNORMAL HIGH (ref 70–99)
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
## 2021-09-09 NOTE — Progress Notes (Addendum)
Patient ID: Mathew Oliver, male   DOB: 10/06/1960, 61 y.o.   MRN: [id number]  SW met with pt and pt wife in room to provide updates from team conference, and d/c date remains 6/30. SW shared waiting on updates on DME. Wife confirms she did not receive Rx card. SW informed will order hospital bed and new w/c 20x18. SW informed both once more information will provide.   SW sent orders to [employer] [facility].  1358- SW spoke with Melissa to inform on above. States she will send in new orders, and will follow-up.   *SW received updates from [place] [facility] and [facility] orders have been sent to [facility]  Cecile Sheerer, MSW, [place] Office: [phone] Cell: [phone] Fax: [phone]

## 2021-09-10 LAB — GLUCOSE, CAPILLARY
Glucose-Capillary: 133 mg/dL — ABNORMAL HIGH (ref 70–99)
Glucose-Capillary: 124 mg/dL — ABNORMAL HIGH (ref 70–99)
Glucose-Capillary: 108 mg/dL — ABNORMAL HIGH (ref 70–99)

## 2021-09-10 NOTE — Progress Notes (Signed)
Physical Therapy Session Note  Patient Details  Name: Mathew Oliver MRN: [id number] Date of Birth: [birth year]  Today's Date: 09/10/2021 PT Individual Time: 1000-1045 1300-1345 PT Individual Time Calculation (min): 45 min and 45 min  Short Term Goals: Week 2:  PT Short Term Goal 1 (Week 2): STG=LTG due to ELOS.  Skilled Therapeutic Interventions/Progress Updates:     Session 1: Patient in w/c with his wife and SIL in the room upon PT arrival. Patient alert and agreeable to PT session. Patient denied pain during session.  Patient's family present for family education and hands on training throughout session. Performed safe guarding with all mobility following PT cues and/or demonstration. Educated on fall risk/prevention, home modifications to prevent falls, and activation of emergency services in the event of a fall during session.   Therapeutic Activity: Bed Mobility: Patient performed sit to supine with mod I with use of hospital bed functions. Educated on differences in the bed that would be delivered from the hospital bed here to manage expectations.  Transfers: Patient performed sit to/from stand x2 using R hemi-walker with supervision and stand pivot w/c>bed using R hand on bed rail with distant supervision and good technique. Provided verbal cues for safe guarding technique. Patient performed a simulated 30" truck height car transfer with supervision using R hemi-walker and car frame for support and with car seat in a reclined position for reduced hip flexion when bringing his legs into the car. Provided cues for safe technique.  Gait Training:  Patient ascended/descended 5" step and walking ~5 feet across a platform x2 using R hemi-walker with CGA-min A for stabilizing when standing on L foot on steps. Performed step-to gait pattern leading with R while ascending and L while descending. Provided demonstration and cues for technique and sequencing. Patient's wife and patient provided  cues to PT, standing in for their son to assist him on the stairs at d/c, on technique for safe guarding using teach-back method with min cues, providing step-by-step instructions for d/c as a reference.  Wheelchair Mobility:  Patient propelled wheelchair >10 feet in the room to simulate home mobility with mod I using B lower extremities and R upper extremity. Patient and his wife donned/doffed leg rests using teach-back method and patient able to lock and unlock both breaks independently using R hand.   Patient reported increased pain/fatigue from back-to-back family education, OT and PT. Patient and family without further questions/concerns for d/c at this time. Patient missed 15 min of skilled PT due to fatigue/pain, RN made aware. Will attempt to make-up missed time as able.    Patient in bed with his family at bedside at end of session with breaks locked and all needs within reach. Cleared patient's wife for transfers bed<>w/c at this time, bed alarm remained off at end of session. Educated on proper footwear for transfers for safety, patient and his wife in agreement and safety plan updated.  Session 2: Patient in bed upon PT arrival. Patient alert and agreeable to PT session. Patient reported 7/10 L pelvic and elbow pain during session, RN made aware and provided medication during session. PT provided repositioning, rest breaks, and distraction as pain interventions throughout session.   Discussed medication and pain management at home around therapies and activities to improved activity tolerance. Focused remainder of session reviewing HEP for lower extremity ROM/strengthening for increased independence with exercises. Educated on use of gait belt as a leg lifter during SLR and hip ABD/ADD on R to increased independence with  these exercises. PT typed up stair instructions for home entry for family at d/c and placed these with HEP in patient binder in the room.  Therapeutic Exercise: Patient  performed the following exercises from HEP handout with min verbal and tactile cues for proper technique. Access Code: [email or web address] - Supine Ankle Pumps  - 2 x daily - 7 x weekly - 2 sets - 10 reps - Supine Quad Set  - 2 x daily - 7 x weekly - 2 sets - 10 reps - 5 sec hold - Supine Isometric Hip Adduction with Pillow at Knees  - 2 x daily - 7 x weekly - 2 sets - 10 reps - 5 sec hold - Supine Gluteal Sets  - 2 x daily - 7 x weekly - 2 sets - 10 reps - 5 sec hold - Supine Heel Slides  - 2 x daily - 7 x weekly - 2 sets - 10 reps - Supine Hip Abduction  - 2 x daily - 7 x weekly - 2 sets - 10 reps - Supine Active Straight Leg Raise  - 2 x daily - 7 x weekly - 2 sets - 10 reps - Seated Long Arc Quad  - 2 x daily - 7 x weekly - 2 sets - 10 reps - Seated March  - 2 x daily - 7 x weekly - 2 sets - 10 reps  Patient in bed at end of session with breaks locked, bed alarm set, and all needs within reach.   Therapy Documentation Precautions:  Precautions Precautions: Fall Required Braces or Orthoses: Sling (for comfort) Restrictions Weight Bearing Restrictions: Yes LUE Weight Bearing: Non weight bearing LLE Weight Bearing: Weight bearing as tolerated Other Position/Activity Restrictions: Sling was not on when OT entered the room. Pt supine in bed. General: PT Amount of Missed Time (min): 15 Minutes PT Missed Treatment Reason: Patient fatigue;Pain    Therapy/Group: Individual Therapy  Meron Bocchino L Colbie Danner PT, DPT  09/10/2021, 12:07 PM

## 2021-09-10 NOTE — Progress Notes (Signed)
PROGRESS NOTE   Subjective/Complaints: Pt still having mild anxiety attacks which awaken him from sleep. Self limited. Doesn't feel anxious at baseline  ROS: Patient denies fever, rash, sore throat, blurred vision, dizziness, nausea, vomiting, diarrhea, cough, shortness of breath or chest pain, neck pain, headache,    Objective:   No results found. Recent Labs    09/08/21 0614  WBC 9.0  HGB 12.2*  HCT 37.0*  PLT 315   Recent Labs    09/08/21 0614  NA 136  K 4.4  CL 102  CO2 28  GLUCOSE 113*  BUN 11  CREATININE 0.71  CALCIUM 8.8*    Intake/Output Summary (Last 24 hours) at 09/10/2021 0857 Last data filed at 09/10/2021 0437 Gross per 24 hour  Intake 716 ml  Output 1575 ml  Net -859 ml        Physical Exam: Vital Signs Blood pressure 130/88, pulse 69, temperature 97.6 F (36.4 C), resp. rate 15, height 6' (1.829 m), weight 133.1 kg, SpO2 95 %.   Constitutional: No distress . Vital signs reviewed. HEENT: NCAT, EOMI, oral membranes moist Neck: supple Cardiovascular: RRR without murmur. No JVD    Respiratory/Chest: CTA Bilaterally without wheezes or rales. Normal effort    GI/Abdomen: BS +, non-tender, non-distended Ext: no clubbing, cyanosis, or edema Psych: pleasant and cooperative  Skin: skin CDI left elbow Neurologic: Cranial nerves II through XII intact, motor strength is 5/5 in RIght  deltoid, bicep, tricep, grip, Left grip 4/5 LUE grossly 3/5 proximal, 3- hip flexor, knee extensors, 4/5 ankle dorsiflexor and plantar flexors--stable exam  Musculoskeletal: limited range LUE and LEs due to pain /fractures    Assessment/Plan: 1. Functional deficits which require 3+ hours per day of interdisciplinary therapy in a comprehensive inpatient rehab setting. Physiatrist is providing close team supervision and 24 hour management of active medical problems listed below. Physiatrist and rehab team continue to  assess barriers to discharge/monitor patient progress toward functional and medical goals  Care Tool:  Bathing    Body parts bathed by patient: Right arm, Left arm, Chest, Abdomen, Front perineal area, Right upper leg, Left upper leg, Face, Buttocks, Right lower leg, Left lower leg   Body parts bathed by helper: Buttocks, Right lower leg, Left lower leg Body parts n/a: Buttocks, Right lower leg, Left lower leg   Bathing assist Assist Level: Independent with assistive device Assistive Device Comment: LHS for BLE and back   Upper Body Dressing/Undressing Upper body dressing   What is the patient wearing?: Pull over shirt    Upper body assist Assist Level: Set up assist    Lower Body Dressing/Undressing Lower body dressing      What is the patient wearing?: Pants, Incontinence brief     Lower body assist Assist for lower body dressing: Contact Guard/Touching assist     Toileting Toileting    Toileting assist Assist for toileting: Supervision/Verbal cueing Assistive Device Comment: urinal   Transfers Chair/bed transfer  Transfers assist     Chair/bed transfer assist level: Supervision/Verbal cueing     Locomotion Ambulation   Ambulation assist      Assist level: Moderate Assistance - Patient 50 - 74% Assistive  device: Parallel bars Max distance: 2 ft   Walk 10 feet activity   Assist  Walk 10 feet activity did not occur: Safety/medical concerns        Walk 50 feet activity   Assist Walk 50 feet with 2 turns activity did not occur: Safety/medical concerns         Walk 150 feet activity   Assist Walk 150 feet activity did not occur: Safety/medical concerns         Walk 10 feet on uneven surface  activity   Assist Walk 10 feet on uneven surfaces activity did not occur: Safety/medical concerns         Wheelchair     Assist Is the patient using a wheelchair?: Yes Type of Wheelchair: Manual    Wheelchair assist level:  Supervision/Verbal cueing Max wheelchair distance: 15 ft    Wheelchair 50 feet with 2 turns activity    Assist        Assist Level: Total Assistance - Patient < 25%   Wheelchair 150 feet activity     Assist      Assist Level: Total Assistance - Patient < 25%   Blood pressure 130/88, pulse 69, temperature 97.6 F (36.4 C), resp. rate 15, height 6' (1.829 m), weight 133.1 kg, SpO2 95 %.  Medical Problem List and Plan: 1. Functional deficits secondary to polytrauma with Left distal humerus Fx, Sacral/pubic rami FX             -patient may shower               -ELOS/Goals: 6/30 Sup             -Continue CIR therapies including PT, OT, and SLP     2.  Antithrombotics: -DVT/anticoagulation:  Pharmaceutical: transition to Eliquis 2.5 mg BID X 30 day             -antiplatelet therapy: ASA 3. Pain Management: added Oxycontin for more consistent pain relief and d/c IV dilaudid            -6/20 Restarted oxycodone, Asked if PT can be scheduled after his oxycodone dose at 12             --continue robaxin  -6/28 would like to taper some of narcotics but he's still having a lot of pelvic pain.  4. Mood/Sleep: LCSW to follow for evaluation and support.              -antipsychotic agents: N/A  -anxiety spells likely d/t oxycodone, ?sleep issues as well. Encouraged him to use PRN if he feels that it will be a struggle to fall asleep 5. Neuropsych/cognition: This patient  is capable of making decisions on his own behalf. 6. Skin/Wound Care: Routine pressure relief measures. Monitor wound for healing.  7. Fluids/Electrolytes/Nutrition: Monitor I/O. Check CMET on Monday 8. Left distal humerus Fx s/p ORIF on 08/25/21: NWB LUE. Follow up X rays in 2 weeks --Sling for comfort.  6/27  olecranon displacement---Dr Haddix reviewed and isn't overly concerned. No change in POC.  9. CAD w/systolic CHF: EF 35-45%. Monitor for signs of overload. Check wts daily.  --Monitor for symptoms with  increase in activity.              --continue ASA, Coreg, Lipitor, aldactone and Zestril             -Heart healthy diet 10. New diagnosis T2DM: Hgb A1C- 7.5.  --WIll start monitoring BS ac/hs and use SSI  for elevated BS -well controlled 6/28            CBG (last 3)  Recent Labs    09/08/21 1701 09/09/21 0614 09/10/21 0609  GLUCAP 78 116* 133*    11. Sacral FX: WBAT 12. Acute blood loss anemia: Monitor for signs of bleeding. HGB 11.8             --6/26 HGB stable at 12.2 13. Morbid obesity-BMI 44: Encourage appropriate diet and activity for wt loss to promote mobility and overall  health.  14. Vitamin D deficiency:  On Ergocalciferol for supplement.  15. Constipation on colace and miralax -moving bowels 16.HLD on Lipitor 17 Acute hypoxic respiratory failure. Resolved. Due to CHF. Had lasix IV x2  18.  HTN Vitals:   09/09/21 1935 09/10/21 0437  BP: 134/71 130/88  Pulse: 71 69  Resp: 15 15  Temp: 97.9 F (36.6 C) 97.6 F (36.4 C)  SpO2: 93% 95%  Cont Coreg, Lisinopril, aldactone-6/19 6/26 Lisinopril  20mg  daily, spironolactone 12.5mg  18. Elevated LFTs  -6/22 improving ALT 48 19. Orthostatic hypotension  -Abdominal binder ordered  -I have stopped HS IVF 20. Azotemia BUN elevated  -6/26 resolved. off IVF  -encourage pO    LOS: 10 days A FACE TO FACE EVALUATION WAS PERFORMED  7/26 09/10/2021, 8:57 AM

## 2021-09-10 NOTE — Progress Notes (Signed)
Occupational Therapy Discharge Summary  Patient Details  Name: Mathew Oliver MRN: [id number] Date of Birth: Jun 25, 1960   Patient has met 6 of 6 long term goals due to improved activity tolerance, improved balance, postural control, ability to compensate for deficits, functional use of  LEFT upper and LEFT lower extremity, improved awareness, and improved coordination.  Patient to discharge at overall Supervision level.  Patient's care partner is independent to provide the necessary physical assistance at discharge. Family education completed 09/10/21 with wife and sister-in-law  to discuss transfers, BADLs, functional mobility, safety, and DME at home. Mathew Oliver has made great progress in therapy. He is still limited by pain in his L hip.  Reasons goals not met: n/a  Recommendation:  Patient will benefit from ongoing skilled OT services in home health setting to continue to advance functional skills in the area of BADL, Vocation, and Reduce care partner burden.  Equipment: BBSC, TTB, LHS  Reasons for discharge: treatment goals met  Patient/family agrees with progress made and goals achieved: Yes  OT Discharge Precautions/Restrictions  Precautions Precautions: Fall Required Braces or Orthoses: Sling Restrictions Weight Bearing Restrictions: Yes LUE Weight Bearing: Non weight bearing LLE Weight Bearing: Weight bearing as tolerated General PT Missed Treatment Reason: Patient fatigue;Pain Vital Signs Therapy Vitals Temp: 97.8 F (36.6 C) Pulse Rate: 75 Resp: 15 BP: 116/74 Patient Position (if appropriate): Lying Oxygen Therapy SpO2: 97 % O2 Device: Room Air Patient Activity (if Appropriate): In bed Pulse Oximetry Type: Intermittent Pain Pain Assessment Pain Scale: 0-10 Pain Score: 6  Pain Type: Acute pain Pain Location: Hip Pain Orientation: Left Pain Descriptors / Indicators: Aching Pain Onset: On-going Pain Intervention(s): Emotional  support;Rest;Relaxation Multiple Pain Sites: No 2nd Pain Site Pain Score: 8 Pain Location: Sacrum Pain Orientation: Mid ADL ADL Equipment Provided: Long-handled sponge Eating: Independent Where Assessed-Eating: Edge of bed Grooming: Independent Where Assessed-Grooming: Sitting at sink Upper Body Bathing: Supervision/safety Where Assessed-Upper Body Bathing: Shower Lower Body Bathing: Supervision/safety Where Assessed-Lower Body Bathing: Shower Upper Body Dressing: Independent Where Assessed-Upper Body Dressing: Wheelchair Lower Body Dressing: Supervision/safety Where Assessed-Lower Body Dressing: Sitting at sink, Standing at sink Toileting: Supervision/safety Where Assessed-Toileting: Glass blower/designer: Close supervision Toilet Transfer Method: Counselling psychologist: Grab bars, Extra wide bedside commode Tub/Shower Transfer: Close supervison Clinical cytogeneticist Method: Optometrist: Radio broadcast assistant, Energy manager: Close supervision Social research officer, government Method: Radiographer, therapeutic: Radio broadcast assistant, Grab bars Vision Baseline Vision/History: 0 No visual deficits Patient Visual Report: No change from baseline Vision Assessment?: No apparent visual deficits Perception  Perception: Within Functional Limits Praxis Praxis: Intact Cognition Cognition Overall Cognitive Status: Within Functional Limits for tasks assessed Arousal/Alertness: Awake/alert Orientation Level: Person;Place;Situation Place: Oriented Situation: Oriented Memory: Appears intact Sustained Attention: Appears intact Awareness: Appears intact Problem Solving: Appears intact Safety/Judgment: Appears intact Brief Interview for Mental Status (BIMS) Repetition of Three Words (First Attempt): 3 Temporal Orientation: Year: Correct Temporal Orientation: Month: Accurate within 5 days Temporal Orientation: Day: Correct Recall:  "Sock": Yes, no cue required Recall: "Blue": Yes, no cue required Recall: "Bed": Yes, no cue required BIMS Summary Score: 15 Sensation Sensation Light Touch: Appears Intact Hot/Cold: Appears Intact Proprioception: Appears Intact Coordination Gross Motor Movements are Fluid and Coordinated: No Fine Motor Movements are Fluid and Coordinated: No Coordination and Movement Description: Gross motor limited by NWB LUE and pain from pelvic fx Finger Nose Finger Test: intact Motor  Motor Motor: Within Functional Limits Motor - Discharge Observations: slower with  motor movements d/t LUE/LLE fxs and generally limited by pain Mobility  Bed Mobility Bed Mobility: Rolling Right;Right Sidelying to Sit;Sitting - Scoot to [employer] of Bed Rolling Right: Independent with assistive device Right Sidelying to Sit: Independent with assistive device Sitting - Scoot to Edge of Bed: Independent with assistive device Sit to Supine: Independent with assistive device Transfers Sit to Stand: Supervision/Verbal cueing Stand to Sit: Supervision/Verbal cueing  Trunk/Postural Assessment  Cervical Assessment Cervical Assessment: Within Functional Limits Thoracic Assessment Thoracic Assessment: Within Functional Limits Lumbar Assessment Lumbar Assessment: Within Functional Limits Postural Control Postural Control: Deficits on evaluation Postural Limitations: difficulty d/t impaired stability/balance d/t fxs  Balance Balance Balance Assessed: Yes Static Sitting Balance Static Sitting - Balance Support: Feet supported;No upper extremity supported Static Sitting - Level of Assistance: 5: Stand by assistance Dynamic Sitting Balance Dynamic Sitting - Balance Support: Feet supported;No upper extremity supported Dynamic Sitting - Level of Assistance: 5: Stand by assistance Dynamic Sitting - Balance Activities: Forward lean/weight shifting;Reaching for objects Static Standing Balance Static Standing - Balance  Support: During functional activity;Right upper extremity supported Static Standing - Level of Assistance: 5: Stand by assistance Dynamic Standing Balance Dynamic Standing - Balance Support: During functional activity Dynamic Standing - Level of Assistance: 5: Stand by assistance Dynamic Standing - Balance Activities: Reaching for objects;Reaching across midline Extremity/Trunk Assessment RUE Assessment RUE Assessment: Within Functional Limits General Strength Comments: 5/5 MMT shoulder flexion and elbow flexion LUE Assessment LUE Assessment: Exceptions to [facility] General Strength Comments: MMT not tested d/t NWB   Lauren Chamblee 09/10/2021, 3:08 PM

## 2021-09-10 NOTE — Progress Notes (Signed)
Patient ID: Mathew Oliver, male   DOB: 04-17-1960, 61 y.o.   MRN: [id number]  SW received updates from [place] that indicating that vendors are being explored for his DME needs.   SW met with pt and updated pt wife to inform on possible delay in d/c as waiting for [facility] to get DME/HH in place. SW updated medical team as well.   Loralee Pacas, MSW, [place] Office: [phone] Cell: [phone] Fax: [phone]

## 2021-09-10 NOTE — Progress Notes (Signed)
Occupational Therapy Session Note  Patient Details  Name: Mathew Oliver MRN: [id number] Date of Birth: 01/15/1961  Session 1: Today's Date: 09/10/2021 OT Individual Time: 0900-1000 OT Individual Time Calculation (min): 60 min   Session 2: Today's Date: 09/10/2021 OT Individual Time: 1400-1430 OT Individual Time Calculation (min): 30 min    Short Term Goals: Week 2:  OT Short Term Goal 1 (Week 2): STG = LTG  Skilled Therapeutic Interventions/Progress Updates:     Session 1: Pt received sitting in bed and agreeable to family education. Wife and sister-in-law present in the room. Discussed discharge planning, home environment, DME, and demonstrations of BADLs and transfers. Educated on equipment that can be purchased including reacher and sock-aid. Pt completed all bed mobility and functional mobility with close supervision. Voided BM on the toilet. Able to doff/don LB clothing with supervision. Transported to ADL bathroom for family to practice hands on training with tub transfer using TTB. Pt completed functional mobility into the bathroom to simulate home set-up. Wife assisting with supervision and transfer. Utilized grab bars in the shower to assist with mobility. Transported back to room and educated family on w/c management of legs. Pt left in w/c with call bell in reach and all needs met. Wife and sister-in-law present in the room.  Session 2: Pt received supine in bed and agreeable to OT treatment. Pt requesting to go outside. Transported outside to address psychosocial adjustment to injury and home environment. Discussed discharge planning and DME arrival at home. Functional mobility completed outside to simulate home/outside environment for 10 steps. Pt requiring more CGA for balance due to uneven surfaces. Transported back upstairs and left in bed with no alarm, call bell in reach, and all needs met.   Therapy Documentation Precautions:  Precautions Precautions: Fall Required  Braces or Orthoses: Sling (L upper extremity) Restrictions Weight Bearing Restrictions: Yes LUE Weight Bearing: Non weight bearing LLE Weight Bearing: Weight bearing as tolerated Other Position/Activity Restrictions: Sling was not on when OT entered the room. Pt supine in bed.  Therapy/Group: Individual Therapy  Lauren Chamblee 09/10/2021, 6:55 AM 

## 2021-09-10 NOTE — Progress Notes (Signed)
Ortho Trauma Note  Doing okay. Pain in elbow and pelvis. No significant changes  X-rays from Monday reviewed and pelvic x-rays are stable. Left elbow shows some displacement of olecranon but still in good position and hardware without displacement or failure  A/P s/p ORIF of left supracondylar distal humerus fracture and nonoperative pelvic fracture  NWB LUE, WBAT BLE Continue ROM as tolerated to left elbow. No restrictions for movement continue passive and active ROM Follow up in 2-3 weeks post discharge  Roby Lofts, MD [facility] [phone] (office) [email or web address]

## 2021-09-11 LAB — GLUCOSE, CAPILLARY
Glucose-Capillary: 120 mg/dL — ABNORMAL HIGH (ref 70–99)
Glucose-Capillary: 122 mg/dL — ABNORMAL HIGH (ref 70–99)

## 2021-09-11 MED ORDER — ASPIRIN 325 MG PO TBEC: 325.0000 mg | DELAYED_RELEASE_TABLET | Freq: Every day | ORAL | 0 refills | Status: AC

## 2021-09-11 MED ORDER — METHOCARBAMOL 750 MG PO TABS
750.0000 mg | ORAL_TABLET | Freq: Four times a day (QID) | ORAL | Status: DC
  Administered 2021-09-11 – 2021-09-12 (×5): 750 mg via ORAL
  Filled 2021-09-11 (×5): qty 1

## 2021-09-11 MED ORDER — CARVEDILOL 25 MG PO TABS: 25.0000 mg | ORAL_TABLET | Freq: Two times a day (BID) | ORAL | 0 refills | Status: AC

## 2021-09-11 MED ORDER — TRAMADOL HCL 50 MG PO TABS
100.0000 mg | ORAL_TABLET | Freq: Three times a day (TID) | ORAL | Status: DC
  Administered 2021-09-11 – 2021-09-12 (×3): 100 mg via ORAL
  Filled 2021-09-11 (×3): qty 2

## 2021-09-11 MED ORDER — VITAMIN D (ERGOCALCIFEROL) 1.25 MG (50000 UNIT) PO CAPS: 50000.0000 [IU] | ORAL_CAPSULE | ORAL | 0 refills | Status: DC

## 2021-09-11 MED ORDER — APIXABAN 2.5 MG PO TABS: 2.5000 mg | ORAL_TABLET | Freq: Two times a day (BID) | ORAL | 0 refills | Status: DC

## 2021-09-11 MED ORDER — VITAMIN D3 25 MCG PO TABS: 1000.0000 [IU] | ORAL_TABLET | Freq: Every day | ORAL | Status: DC

## 2021-09-11 MED ORDER — METHOCARBAMOL 500 MG PO TABS: 500.0000 mg | ORAL_TABLET | Freq: Four times a day (QID) | ORAL | 0 refills | Status: DC | PRN

## 2021-09-11 MED ORDER — LISINOPRIL 40 MG PO TABS: 40.0000 mg | ORAL_TABLET | Freq: Every evening | ORAL | 0 refills | Status: AC

## 2021-09-11 MED ORDER — ACETAMINOPHEN 325 MG PO TABS: 325.0000 mg | ORAL_TABLET | ORAL | Status: DC | PRN

## 2021-09-11 MED ORDER — ATORVASTATIN CALCIUM 40 MG PO TABS: 40.0000 mg | ORAL_TABLET | Freq: Every evening | ORAL | 0 refills | Status: AC

## 2021-09-11 MED ORDER — ONE-A-DAY MENS PO TABS: 1.0000 | ORAL_TABLET | Freq: Every day | ORAL | 0 refills | Status: AC

## 2021-09-11 MED ORDER — SPIRONOLACTONE 25 MG PO TABS: 12.5000 mg | ORAL_TABLET | Freq: Every evening | ORAL | 0 refills | Status: AC

## 2021-09-11 NOTE — Progress Notes (Signed)
Patient ID: Mathew Oliver, male   DOB: 01-18-1961, 61 y.o.   MRN: [id number]  SW spoke with [facility] CM to discuss status of DME. Reports will follow-up with vendor to get updates, however, no new information at this time.    Cecile Sheerer, MSW, LCSWA Office: [phone] Cell: [phone] Fax: [phone]

## 2021-09-11 NOTE — Progress Notes (Signed)
Occupational Therapy Session Note  Patient Details  Name: Mathew Oliver MRN: [id number] Date of Birth: [birth year]  Today's Date: 09/11/2021 OT Individual Time: 1030-1200 OT Individual Time Calculation (min): 90 min    Short Term Goals: Week 2:  OT Short Term Goal 1 (Week 2): STG = LTG  Skilled Therapeutic Interventions/Progress Updates:    Pt greeted semi-reclined in bed and agreeable to OT treatment session. Pt requested to shower today. Pt completed stand-pivot transfers with hemi-walker and supervision. Bathing completed seated on tub bench using lateral leans to wash buttocks and LH sponge to wash lower legs. Dressing from wc with pt able to reach to thread pants today and pull them up with close supervision. Wc propulsion to therapy gym using R UE and B LE's. UB there-ex focused on L elbow ROM. 3 sets of 10 elbow flex/ext with arm hanging by side. Progressed to towel slides with elbow flex/ext, horizontal adduction/abduction, forearm pronation/supination. Utilized Wii bowling to facilitate more flex/ext with L elbow in sitting. Pt returned to room and pivoted back to bed with close supervision and handrails of bed. Pt left semi-reclined in bed with bed alarm on, call bell in reach and needs met.   Therapy Documentation Precautions:  Precautions Precautions: Fall Required Braces or Orthoses: Sling Restrictions Weight Bearing Restrictions: Yes LUE Weight Bearing: Non weight bearing LLE Weight Bearing: Weight bearing as tolerated Other Position/Activity Restrictions: Sling was not on when OT entered the room. Pt supine in bed.  Pain: Pain Assessment Pain Scale: 0-10 Pain Score: 5  Pain Type: Surgical pain Pain Location: Hip Pain Orientation: Left Pain Descriptors / Indicators: Aching Pain Onset: On-going Patients Stated Pain Goal: 1 Pain Intervention(s): Repositioned   Therapy/Group: Individual Therapy  Valma Cava 09/11/2021, 11:31 AM

## 2021-09-11 NOTE — Progress Notes (Signed)
Nutrition Brief Note   Pt resting in bed. Reports that his appetite has been good and that he has been eating well. States that he is ready to be discharged but is still pending DME finalization. Pt declines any needs from RD at this time. At previous visit, this RD placed DM education in AVS. Per EMR, pt is eating 60-100% of his meals; average completion being 84%.  Nutrition Diagnosis: Inadequate oral intake related to poor appetite as evidenced by per patient/family report. Goal: Patient will meet greater than or equal to 90% of their needs. Interventions: Multivitamin w/ minerals daily DM diet education  No additional nutrition interventions warranted at this time. If nutrition issues arise, please consult RD.    Kirby Crigler RD, LDN Clinical Dietitian See Loretha Stapler for contact information.

## 2021-09-11 NOTE — Progress Notes (Signed)
Physical Therapy Discharge Summary  Patient Details  Name: Mathew Oliver MRN: [id number] Date of Birth: 11/26/1960  Patient has met 10 of 10 long term goals due to improved activity tolerance, improved balance, improved postural control, increased strength, increased range of motion, decreased pain, and ability to compensate for deficits.  Patient to discharge at a wheelchair level [place] using hemi-walker for transfers and short household gait.   Patient's care partner is independent to provide the necessary physical assistance at discharge.  Reasons goals not met: All PT goals met at this time.  Recommendation:  Patient will benefit from ongoing skilled PT services in home health setting to continue to advance safe functional mobility, address ongoing impairments in balance, strength/ROM, functional mobility, gait and stair training, activity tolerance, pain management, patient/caregiver education, and minimize fall risk.  Equipment: Hospital bed, 20"x18" manual wheelchair, hemi-walker  Reasons for discharge: treatment goals met  Patient/family agrees with progress made and goals achieved: Yes  PT Discharge Precautions/Restrictions Precautions Precautions: Fall Required Braces or Orthoses: Sling Restrictions Weight Bearing Restrictions: Yes LUE Weight Bearing: Non weight bearing LLE Weight Bearing: Weight bearing as tolerated Pain Interference Pain Interference Pain Effect on Sleep: 2. Occasionally Pain Interference with Therapy Activities: 2. Occasionally Pain Interference with Day-to-Day Activities: 3. Frequently Vision/Perception  Vision - History Ability to See in Adequate Light: 0 Adequate Perception Perception: Within Functional Limits Praxis Praxis: Intact  Cognition Overall Cognitive Status: Within Functional Limits for tasks assessed Arousal/Alertness: Awake/alert Sustained Attention: Appears intact Memory: Appears intact Awareness: Appears  intact Problem Solving: Appears intact Safety/Judgment: Appears intact Sensation Sensation Light Touch: Appears Intact Hot/Cold: Appears Intact Proprioception: Appears Intact Coordination Gross Motor Movements are Fluid and Coordinated: No Fine Motor Movements are Fluid and Coordinated: No Coordination and Movement Description: Gross motor limited by NWB LUE and pain from pelvic fx Heel Shin Test: WFL within available hip ROM Motor  Motor Motor: Within Functional Limits Motor - Discharge Observations: slower with motor movements d/t LUE/LLE fxs and generally limited by pain  Mobility Bed Mobility Bed Mobility: Rolling Right;Supine to Sit;Sit to Supine Rolling Right: Independent with assistive device (requires hospital bed for independence) Supine to Sit: Independent with assistive device (requires hospital bed for independence) Sit to Supine: Independent with assistive device (requires hospital bed for independence) Transfers Transfers: Sit to Stand;Stand to Sit;Stand Pivot Transfers Sit to Stand: Independent with assistive device (usign hemi-walker) Stand to Sit: Independent with assistive device (using hemi-walker) Stand Pivot Transfers: Supervision/Verbal cueing Stand Pivot Transfer Details: Verbal cues for technique;Verbal cues for precautions/safety;Verbal cues for safe use of DME/AE Stand Pivot Transfer Details (indicate cue type and reason): min cues for safety Transfer (Assistive device): Hemi-walker Locomotion  Gait Ambulation: Yes Gait Assistance: Supervision/Verbal cueing Gait Distance (Feet): 25 Feet Assistive device: Hemi-walker (R rail only) Gait Assistance Details: Verbal cues for precautions/safety;Verbal cues for technique;Verbal cues for safe use of DME/AE Gait Gait: Yes Gait Pattern: Impaired Gait Pattern: Step-to pattern;Decreased stance time - left;Decreased stride length;Decreased hip/knee flexion - right;Decreased hip/knee flexion - left;Decreased  dorsiflexion - right;Decreased dorsiflexion - left;Trunk flexed;Antalgic;Lateral trunk lean to right Gait velocity: significantly decreased Stairs / Additional Locomotion Stairs: Yes Stairs Assistance: Contact Guard/Touching assist Stair Management Technique: Other (comment) (hemi-walker) Number of Stairs: 1 Height of Stairs: 5 (x2) Curb: Nurse, mental health Mobility: Yes Wheelchair Assistance: Chartered loss adjuster: Both lower extermities;Right upper extremity Wheelchair Parts Management: Needs assistance (able to place R ELR, needs assist with L) Distance: >200 ft  Trunk/Postural  Assessment  Cervical Assessment Cervical Assessment: Within Functional Limits Thoracic Assessment Thoracic Assessment: Within Functional Limits Lumbar Assessment Lumbar Assessment: Within Functional Limits Postural Control Postural Control: Deficits on evaluation Postural Limitations: difficulty d/t impaired stability/balance d/t fxs  Balance Balance Balance Assessed: Yes Static Sitting Balance Static Sitting - Balance Support: Feet supported;No upper extremity supported Static Sitting - Level of Assistance: 7: Independent Dynamic Sitting Balance Dynamic Sitting - Balance Support: Feet supported;No upper extremity supported Dynamic Sitting - Level of Assistance: 7: Independent Dynamic Sitting - Balance Activities: Forward lean/weight shifting;Reaching for objects Static Standing Balance Static Standing - Balance Support: During functional activity;Right upper extremity supported Static Standing - Level of Assistance: 7: Independent Dynamic Standing Balance Dynamic Standing - Balance Support: During functional activity Dynamic Standing - Level of Assistance: 5: Stand by assistance Dynamic Standing - Balance Activities: Reaching for objects;Reaching across midline Extremity Assessment  RLE Assessment RLE Assessment: Within  Functional Limits Active Range of Motion (AROM) Comments: WFL for all functional activity General Strength Comments: Grossly 5/5 throughout in sitting LLE Assessment LLE Assessment: Exceptions to [facility] Active Range of Motion (AROM) Comments: ankle and knee WFL, hip flexion limited ~100 deg, limited to no IR due to pain General Strength Comments: Grossly 3+ to 4/5 hip flex/abd/add, otherwise 5/5 in sitting, limited by pain    Thetis Schwimmer L Jeran Hiltz PT, DPT  09/11/2021, 9:12 AM

## 2021-09-11 NOTE — Progress Notes (Signed)
Physical Therapy Session Note  Patient Details  Name: Mathew Oliver MRN: [id number] Date of Birth: 1960-03-31  Today's Date: 09/11/2021 PT Individual Time: 0998-3382 PT Individual Time Calculation (min): 60 min   Short Term Goals: Week 2:  PT Short Term Goal 1 (Week 2): STG=LTG due to ELOS.  Skilled Therapeutic Interventions/Progress Updates:     Patient in w/c in the room upon PT arrival. Patient alert and agreeable to PT session. Patient reported 5/10 L pelvic pain at rest, 8/10 following gait trial, during session, RN made aware. PT provided repositioning, rest breaks, and distraction as pain interventions throughout session.   Focused session on d/c assessment, see d/c note for details, for upcoming d/c home, functional transfers, and gait training. Discussed d/c planning and equipment needs for d/c during session.   Therapeutic Activity: Bed Mobility: Patient performed rolling R/L and supine to from sit with mod I with use of hospital bed features.  Transfers: Patient performed sit to/from stand x2 with set-up assist-mod I using hemi-walker from w/c. Performed stand pivot w/c>bed with use of hospital bed rail with mod I, performing w/c set-up independently as well. Patient appropriately locked both wheels before all transfers.   Gait Training:  Patient ambulated 26 feet using R hemi-walker with supervision. Ambulated with step-to gait pattern leading with L with antalgic gait on L. Provided verbal cues for sequencing and increased step length for improved energy conservation for increased distance.  Reviewed sequencing for steps into his house, patient required mod cues for recall, reminded him that he has a handout with the sequencing written down, also had him repeat the sequencing x3 for improved recall following therapist's example.   Patient in bed at end of session with breaks locked and all needs within reach.   Therapy Documentation Precautions:   Precautions Precautions: Fall Required Braces or Orthoses: Sling Restrictions Weight Bearing Restrictions: Yes LUE Weight Bearing: Non weight bearing LLE Weight Bearing: Weight bearing as tolerated Other Position/Activity Restrictions: Sling was not on when OT entered the room. Pt supine in bed.    Therapy/Group: Individual Therapy  Mathew Oliver PT, DPT  09/11/2021, 5:28 PM

## 2021-09-11 NOTE — Progress Notes (Signed)
Physical Therapy Session Note  Patient Details  Name: BEJAMIN HACKBART MRN: [id number] Date of Birth: Jan 04, 1961  Today's Date: 09/11/2021 PT Individual Time: 1430-1500 PT Individual Time Calculation (min): 30 min   Short Term Goals: Week 2:  PT Short Term Goal 1 (Week 2): STG=LTG due to ELOS.  Skilled Therapeutic Interventions/Progress Updates:     Pt received supine in bed and agrees to therapy. Reports pain in L hip that he says is worse every time he showers, maybe from having to sit on hard plastic seat. Supine to sit with bed features and cues for positioning at EOB. Pt performs sit to stand with hemiwalker and cues for initiation. Stand step transfer to [facility] with cues for positioning. WC transport to gym for time management. Pt asks to "do something that isn't too painful." PT suggests Nustep for strength and endurance training as well as mobilizing L lower extremity for pain relief. Stand step transfer to nustep without hemiwalker, requiring minA for stability and pt with notable decrease in safety. Pt complete Nustep at workload of 6 for 5:22 with average steps per minute >40. Following brief seated rest break, pt completes additional 6:30 at similar rate. PT provides cues for hand placement and full ROM. Pt performs stand step transfer from Nustep>WC>bed with CGA and cues for sequencing. Supine to sit with bed features. Left with all needs within reach.  Therapy Documentation Precautions:  Precautions Precautions: Fall Required Braces or Orthoses: Sling Restrictions Weight Bearing Restrictions: Yes LUE Weight Bearing: Non weight bearing LLE Weight Bearing: Weight bearing as tolerated Other Position/Activity Restrictions: Sling was not on when OT entered the room. Pt supine in bed.   Therapy/Group: Individual Therapy  Beau Fanny, PT, DPT 09/11/2021, 4:49 PM

## 2021-09-11 NOTE — Progress Notes (Signed)
PROGRESS NOTE   Subjective/Complaints: Reports better sleep. Used trazodone last night. Still having pain in pelvis, particularly after he's been in shower in the AM.   ROS: Patient denies fever, rash, sore throat, blurred vision, dizziness, nausea, vomiting, diarrhea, cough, shortness of breath or chest pain, headache, or mood change.   Objective:   No results found. No results for input(s): "WBC", "HGB", "HCT", "PLT" in the last 72 hours.  No results for input(s): "NA", "K", "CL", "CO2", "GLUCOSE", "BUN", "CREATININE", "CALCIUM" in the last 72 hours.   Intake/Output Summary (Last 24 hours) at 09/11/2021 0916 Last data filed at 09/11/2021 0830 Gross per 24 hour  Intake 1316 ml  Output 900 ml  Net 416 ml        Physical Exam: Vital Signs Blood pressure 124/82, pulse 69, temperature 97.8 F (36.6 C), temperature source Oral, resp. rate 18, height 6' (1.829 m), weight 127.5 kg, SpO2 94 %.   Constitutional: No distress . Vital signs reviewed. HEENT: NCAT, EOMI, oral membranes moist Neck: supple Cardiovascular: RRR without murmur. No JVD    Respiratory/Chest: CTA Bilaterally without wheezes or rales. Normal effort    GI/Abdomen: BS +, non-tender, non-distended Ext: no clubbing, cyanosis, or edema Psych: pleasant and cooperative  Skin: skin CDI left elbow Neurologic: Cranial nerves II through XII intact, motor strength is 5/5 in RIght  deltoid, bicep, tricep, grip, Left grip 4/5 LUE grossly 3/5 proximal, 3- hip flexor, knee extensors, 4/5 ankle dorsiflexor and plantar flexors--stable exam  Musculoskeletal: limited range LUE and LEs due to pain /fractures    Assessment/Plan: 1. Functional deficits which require 3+ hours per day of interdisciplinary therapy in a comprehensive inpatient rehab setting. Physiatrist is providing close team supervision and 24 hour management of active medical problems listed  below. Physiatrist and rehab team continue to assess barriers to discharge/monitor patient progress toward functional and medical goals  Care Tool:  Bathing    Body parts bathed by patient: Right arm, Left arm, Chest, Abdomen, Front perineal area, Right upper leg, Left upper leg, Face, Buttocks, Right lower leg, Left lower leg   Body parts bathed by helper: Buttocks, Right lower leg, Left lower leg Body parts n/a: Buttocks, Right lower leg, Left lower leg   Bathing assist Assist Level: Supervision/Verbal cueing Assistive Device Comment: LHS for BLE and back   Upper Body Dressing/Undressing Upper body dressing   What is the patient wearing?: Pull over shirt    Upper body assist Assist Level: Set up assist    Lower Body Dressing/Undressing Lower body dressing      What is the patient wearing?: Pants, Incontinence brief     Lower body assist Assist for lower body dressing: Supervision/Verbal cueing     Toileting Toileting    Toileting assist Assist for toileting: Supervision/Verbal cueing Assistive Device Comment: urinal   Transfers Chair/bed transfer  Transfers assist     Chair/bed transfer assist level: Supervision/Verbal cueing Chair/bed transfer assistive device:  (hemi walker)   Locomotion Ambulation   Ambulation assist      Assist level: Moderate Assistance - Patient 50 - 74% Assistive device: Parallel bars Max distance: 2 ft   Walk 10 feet activity  Assist  Walk 10 feet activity did not occur: Safety/medical concerns        Walk 50 feet activity   Assist Walk 50 feet with 2 turns activity did not occur: Safety/medical concerns         Walk 150 feet activity   Assist Walk 150 feet activity did not occur: Safety/medical concerns         Walk 10 feet on uneven surface  activity   Assist Walk 10 feet on uneven surfaces activity did not occur: Safety/medical concerns         Wheelchair     Assist Is the patient using  a wheelchair?: Yes Type of Wheelchair: Manual    Wheelchair assist level: Supervision/Verbal cueing Max wheelchair distance: 15 ft    Wheelchair 50 feet with 2 turns activity    Assist        Assist Level: Total Assistance - Patient < 25%   Wheelchair 150 feet activity     Assist      Assist Level: Total Assistance - Patient < 25%   Blood pressure 124/82, pulse 69, temperature 97.8 F (36.6 C), temperature source Oral, resp. rate 18, height 6' (1.829 m), weight 127.5 kg, SpO2 94 %.  Medical Problem List and Plan: 1. Functional deficits secondary to polytrauma with Left distal humerus Fx, Sacral/pubic rami FX             -patient may shower               -ELOS/Goals: 6/30 pending availability of DME,  Sup             --Continue CIR therapies including PT, OT     2.  Antithrombotics: -DVT/anticoagulation:  Pharmaceutical: transition to Eliquis 2.5 mg BID X 30 day             -antiplatelet therapy: ASA 3. Pain Management:    -6/29 on scheduled oxycodone bid and qid tramadol. Will schedule robaxin as well as I suspect that pain in am may be spasm related  -will decrease tramadol frequency to tid             -discussed addiction with patient today (he had questions) 4. Mood/Sleep: LCSW to follow for evaluation and support.              -antipsychotic agents: N/A  -anxiety spells likely d/t oxycodone, ?sleep issues as well. Encouraged him to use PRN if he feels that it will be a struggle to fall asleep 5. Neuropsych/cognition: This patient  is capable of making decisions on his own behalf. 6. Skin/Wound Care: Routine pressure relief measures. Monitor wound for healing.  7. Fluids/Electrolytes/Nutrition: Monitor I/O. Check CMET on Monday 8. Left distal humerus Fx s/p ORIF on 08/25/21: NWB LUE. Follow up X rays in 2 weeks --Sling for comfort.  6/27  olecranon displacement---Dr Haddix reviewed and isn't overly concerned. No change in POC.  9. CAD w/systolic CHF: EF  40-45%. Monitor for signs of overload. Check wts daily.  --Monitor for symptoms with increase in activity.              --continue ASA, Coreg, Lipitor, aldactone and Zestril             -Heart healthy diet 10. New diagnosis T2DM: Hgb A1C- 7.5.  --WIll start monitoring BS ac/hs and use SSI for elevated BS -well controlled 6/28            CBG (last 3)  Recent Labs  09/10/21 1146 09/10/21 1644 09/11/21 0557  GLUCAP 124* 108* 120*    11. Sacral FX: WBAT 12. Acute blood loss anemia: Monitor for signs of bleeding. HGB 11.8             --6/26 HGB stable at 12.2 13. Morbid obesity-BMI 44: Encourage appropriate diet and activity for wt loss to promote mobility and overall  health.  14. Vitamin D deficiency:  On Ergocalciferol for supplement.  15. Constipation on colace and miralax -moving bowels 16.HLD on Lipitor 17 Acute hypoxic respiratory failure. Resolved. Due to CHF. Had lasix IV x2  18.  HTN Vitals:   09/10/21 1953 09/11/21 0417  BP: 123/77 124/82  Pulse: 73 69  Resp: 18 18  Temp: 98.5 F (36.9 C) 97.8 F (36.6 C)  SpO2: 95% 94%  Cont Coreg, Lisinopril, aldactone-6/19 6/29 controlled on Lisinopril  20mg  daily, spironolactone 12.5mg  18. Elevated LFTs  -6/22 improving ALT 48 19. Orthostatic hypotension  -Abdominal binder ordered  -off IVF 20. Azotemia BUN elevated  -6/26 resolved. off IVF  -encourage pO    LOS: 11 days A FACE TO FACE EVALUATION WAS PERFORMED  7/26 09/11/2021, 9:16 AM

## 2021-09-11 NOTE — Progress Notes (Signed)
Inpatient Rehabilitation Discharge Medication Review by a Pharmacist  A complete drug regimen review was completed for this patient to identify any potential clinically significant medication issues.  High Risk Drug Classes Is patient taking? Indication by Medication  Antipsychotic No   Anticoagulant Yes Eliquis - VTE ppx  Antibiotic No   Opioid Yes Tramadol, oxycodone prn pain  Antiplatelet No   Hypoglycemics/insulin No   Vasoactive Medication Yes Spironolactone, carvedilol, lisinopril - CHF  Chemotherapy No   Other Yes Methocarbamol - spasms Atorvastatin - HLD     Type of Medication Issue Identified Description of Issue Recommendation(s)  Drug Interaction(s) (clinically significant)     Duplicate Therapy     Allergy     No Medication Administration End Date     Incorrect Dose     Additional Drug Therapy Needed     Significant med changes from prior encounter (inform family/care partners about these prior to discharge).    Other       Clinically significant medication issues were identified that warrant physician communication and completion of prescribed/recommended actions by midnight of the next day:  No  Pharmacist comments: None  Time spent performing this drug regimen review (minutes):  20 minutes   Mathew Oliver 09/11/2021 1:09 PM

## 2021-09-11 NOTE — Progress Notes (Incomplete)
Physical Therapy Session Note  Patient Details  Name: Mathew Oliver MRN: [id number] Date of Birth: 31-Oct-1960  Today's Date: 09/11/2021 PT Individual Time:  -      Short Term Goals:  Week 2:  PT Short Term Goal 1 (Week 2): STG=LTG due to ELOS.  Skilled Therapeutic Interventions/Progress Updates:   Pt received supine in bed and agreeable to PT. Supine>sit transfer without assist and or cues but performed with UE supported on bed rails.       Therapy Documentation Precautions:  Precautions Precautions: Fall Required Braces or Orthoses: Sling Restrictions Weight Bearing Restrictions: Yes LUE Weight Bearing: Non weight bearing LLE Weight Bearing: Weight bearing as tolerated Other Position/Activity Restrictions: Sling was not on when OT entered the room. Pt supine in bed. General:   Vital Signs:   Pain: Pain Assessment Pain Scale: 0-10 Pain Score: 5  Pain Type: Surgical pain Pain Location: Hip Pain Orientation: Left Pain Descriptors / Indicators: Aching Pain Onset: On-going Pain Intervention(s): Medication (See eMAR) Mobility:   Locomotion :    Trunk/Postural Assessment :    Balance:   Exercises:   Other Treatments:      Therapy/Group: Individual Therapy  Golden Pop 09/11/2021, 8:30 AM

## 2021-09-11 NOTE — Progress Notes (Signed)
Physical Therapy Session Note  Patient Details  Name: Mathew Oliver MRN: [id number] Date of Birth: 08-01-1960  Today's Date: 09/11/2021 PT Individual Time: 0802-0845 PT Individual Time Calculation (min): 43 min   Short Term Goals:  Week 2:  PT Short Term Goal 1 (Week 2): STG=LTG due to ELOS.   Skilled Therapeutic Interventions/Progress Updates:   Pt received supine in bed and agreeable to PT. Supine>sit transfer without assist or cues, but UE support on bed rail. Pt able to don shoes sitting EOB with set up from PT. Stand pivot transfer to and from [facility] with HW and no assist from PT, but increased time for safety noted by Pt. Pt reports need for BM. Toilet transfer as performed to an from PT with [place]. Pt able to have continent BM.  PT assisted slightly for clothing management to don pants standing at toilet. Pt performed all pericre. Hand hygiene, oral hygiene and hair care sitting in WC at sink without cues or assist from PT. WC mobility through hall x [id number] and [phone] assist and BLE/RUE to propell WC. Patient returned to room and left [facility] in reach and all needs met.         Therapy Documentation Precautions:  Precautions Precautions: Fall Required Braces or Orthoses: Sling Restrictions Weight Bearing Restrictions: Yes LUE Weight Bearing: Non weight bearing LLE Weight Bearing: Weight bearing as tolerated Other Position/Activity Restrictions: Sling was not on when OT entered the room. Pt supine in bed.  Pain: Pain Assessment Pain Scale: 0-10 Pain Score: 5  Pain Type: Surgical pain Pain Location: Hip Pain Orientation: Left Pain Descriptors / Indicators: Aching Pain Onset: On-going Pain Intervention(s): Medication (See eMAR)    Therapy/Group: Individual Therapy  AuLorie Phenix/29/2023, 8:48 AM

## 2021-09-12 ENCOUNTER — Telehealth: Payer: Self-pay

## 2021-09-12 LAB — GLUCOSE, CAPILLARY: Glucose-Capillary: 118 mg/dL — ABNORMAL HIGH (ref 70–99)

## 2021-09-12 MED ORDER — METHOCARBAMOL 500 MG PO TABS: 500.0000 mg | ORAL_TABLET | Freq: Four times a day (QID) | ORAL | 0 refills | Status: DC

## 2021-09-12 MED ORDER — BLOOD GLUCOSE METER KIT: PACK | 0 refills | Status: AC

## 2021-09-12 MED ORDER — TRAZODONE HCL 50 MG PO TABS: 25.0000 mg | ORAL_TABLET | Freq: Every evening | ORAL | 0 refills | Status: DC | PRN

## 2021-09-12 MED ORDER — OXYCODONE HCL 5 MG PO TABS: 5.0000 mg | ORAL_TABLET | Freq: Four times a day (QID) | ORAL | 0 refills | Status: DC | PRN

## 2021-09-12 MED ORDER — TRAMADOL HCL 50 MG PO TABS
100.0000 mg | ORAL_TABLET | Freq: Four times a day (QID) | ORAL | 0 refills | Status: DC | PRN
Start: 2021-09-12 — End: 2021-09-30

## 2021-09-12 MED ORDER — TRAMADOL HCL 50 MG PO TABS
100.0000 mg | ORAL_TABLET | Freq: Four times a day (QID) | ORAL | Status: DC
  Administered 2021-09-12: 100 mg via ORAL
  Filled 2021-09-12: qty 2

## 2021-09-12 MED ORDER — METHOCARBAMOL 750 MG PO TABS: 750.0000 mg | ORAL_TABLET | Freq: Four times a day (QID) | ORAL | 0 refills | Status: DC

## 2021-09-12 MED ORDER — APIXABAN 2.5 MG PO TABS: 2.5000 mg | ORAL_TABLET | Freq: Two times a day (BID) | ORAL | 0 refills | Status: DC

## 2021-09-12 NOTE — Progress Notes (Signed)
Inpatient Rehabilitation Care Coordinator Discharge Note   Patient Details  Name: Mathew Oliver MRN: [id number] Date of Birth: Apr 02, 1960   Discharge location: HOME WITH WIFE WHO CAN ASSIST  Length of Stay: 12 DAYS  Discharge activity level: SUPERVISION-CGA LEVEL  Home/community participation: LIMITED  Patient response HR:CBULAG Literacy - How often do you need to have someone help you when you read instructions, pamphlets, or other written material from your doctor or pharmacy?: Never  Patient response TX:MIWOEH Isolation - How often do you feel lonely or isolated from those around you?: Sometimes  Services provided included: MD, RD, PT, OT, SLP, RN, CM, TR, Pharmacy, Neuropsych, SW  Financial Services:  Field seismologist Utilized: Worker's Charity fundraiser offered to/list presented to: NA  Follow-up services arranged:  Home Health, DME, Patient/Family has no preference for HH/DME agencies Home Health Agency: [employer] HAS ARRANGED PT , OT RN AIDE    DME : [employer] HAS ARRANGED-RW, WHEELCHAIR, HOSPITAL BED, BSC, TUB BENCH    Patient response to transportation need: Is the patient able to respond to transportation needs?: Yes In the past 12 months, has lack of transportation kept you from medical appointments or from getting medications?: No In the past 12 months, has lack of transportation kept you from meetings, work, or from getting things needed for daily living?: No    Comments (or additional information):PT READY FOR DISCHARGE HOME WIFE HAS BEEN EDUCATED ON CARE NEEDS.  Patient/Family verbalized understanding of follow-up arrangements:  Yes  Individual responsible for coordination of the follow-up plan: Mathew Oliver  [phone]  Confirmed correct DME delivered: Mathew Oliver 09/12/2021    Mathew Oliver

## 2021-09-12 NOTE — Telephone Encounter (Signed)
Order in. thx 

## 2021-09-12 NOTE — Addendum Note (Signed)
Addended by: Faith Rogue T on: 09/12/2021 08:47 PM   Modules accepted: Orders

## 2021-09-12 NOTE — Telephone Encounter (Signed)
Rx sent to [facility]

## 2021-09-12 NOTE — Telephone Encounter (Signed)
Patient's wife called and stated [facility] did not have the prescription. Apologized to the patient because I did not add the correct pharmacy to the patient's chart. Can you please send the Oxycodone to the [facility]?

## 2021-09-12 NOTE — Addendum Note (Signed)
Addended by: Faith Rogue T on: 09/12/2021 01:02 PM   Modules accepted: Orders

## 2021-09-12 NOTE — Telephone Encounter (Signed)
[facility] called and stated they do not have Oxycodone 5 mg in stock. Called patient's wife to get alternate pharmacy. [facility] in [place] has it in stock. Prescription cancelled at [facility]

## 2021-09-12 NOTE — Progress Notes (Signed)
PA Dois Davenport in with pt to discuss discharge instructions. Pt in agreement. Belongings gathered. Pt pre medicated prior to DC. Pt left per wheelchair to private vehicle. No complications noted. Mylo Red, LPN

## 2021-09-12 NOTE — Progress Notes (Signed)
PROGRESS NOTE   Subjective/Complaints: Feels that q8 is a little long for tramadol. Trazodone really helps his sleep. Overall he admits that his pain is better. ?still waiting on DME  ROS: Patient denies fever, rash, sore throat, blurred vision, dizziness, nausea, vomiting, diarrhea, cough, shortness of breath or chest pain,   headache, or mood change.   Objective:   No results found. No results for input(s): "WBC", "HGB", "HCT", "PLT" in the last 72 hours.  No results for input(s): "NA", "K", "CL", "CO2", "GLUCOSE", "BUN", "CREATININE", "CALCIUM" in the last 72 hours.   Intake/Output Summary (Last 24 hours) at 09/12/2021 0959 Last data filed at 09/12/2021 0818 Gross per 24 hour  Intake 997 ml  Output 750 ml  Net 247 ml        Physical Exam: Vital Signs Blood pressure 126/79, pulse 64, temperature 98 F (36.7 C), temperature source Oral, resp. rate 14, height 6' (1.829 m), weight 128.8 kg, SpO2 94 %.   Constitutional: No distress . Vital signs reviewed. HEENT: NCAT, EOMI, oral membranes moist Neck: supple Cardiovascular: RRR without murmur. No JVD    Respiratory/Chest: CTA Bilaterally without wheezes or rales. Normal effort    GI/Abdomen: BS +, non-tender, non-distended Ext: no clubbing, cyanosis, or edema Psych: pleasant and cooperative  Skin: skin CDI left elbow, some surrounding bruising Neurologic: Cranial nerves II through XII intact, motor strength is 5/5 in RIght  deltoid, bicep, tricep, grip, Left grip 4/5 LUE grossly 3/5 proximal, 3- hip flexor, knee extensors, 4/5 ankle dorsiflexor and plantar flexors--stable exam  Musculoskeletal: limited range LUE and LEs due to pain /fractures. Left elbow sore   Assessment/Plan: 1. Functional deficits which require 3+ hours per day of interdisciplinary therapy in a comprehensive inpatient rehab setting. Physiatrist is providing close team supervision and 24 hour  management of active medical problems listed below. Physiatrist and rehab team continue to assess barriers to discharge/monitor patient progress toward functional and medical goals  Care Tool:  Bathing    Body parts bathed by patient: Right arm, Left arm, Chest, Abdomen, Front perineal area, Right upper leg, Left upper leg, Face, Buttocks, Right lower leg, Left lower leg   Body parts bathed by helper: Buttocks, Right lower leg, Left lower leg Body parts n/a: Buttocks, Right lower leg, Left lower leg   Bathing assist Assist Level: Supervision/Verbal cueing Assistive Device Comment: LHS for BLE and back   Upper Body Dressing/Undressing Upper body dressing   What is the patient wearing?: Pull over shirt    Upper body assist Assist Level: Set up assist    Lower Body Dressing/Undressing Lower body dressing      What is the patient wearing?: Pants, Incontinence brief     Lower body assist Assist for lower body dressing: Supervision/Verbal cueing     Toileting Toileting    Toileting assist Assist for toileting: Supervision/Verbal cueing Assistive Device Comment: urinal   Transfers Chair/bed transfer  Transfers assist     Chair/bed transfer assist level: Supervision/Verbal cueing Chair/bed transfer assistive device: Other (hemi-walker)   Locomotion Ambulation   Ambulation assist      Assist level: Supervision/Verbal cueing Assistive device: Walker-hemi Max distance: 26 ft  Walk 10 feet activity   Assist  Walk 10 feet activity did not occur: Safety/medical concerns  Assist level: Supervision/Verbal cueing Assistive device: Walker-hemi   Walk 50 feet activity   Assist Walk 50 feet with 2 turns activity did not occur: Safety/medical concerns         Walk 150 feet activity   Assist Walk 150 feet activity did not occur: Safety/medical concerns         Walk 10 feet on uneven surface  activity   Assist Walk 10 feet on uneven surfaces activity  did not occur: Safety/medical concerns         Wheelchair     Assist Is the patient using a wheelchair?: Yes Type of Wheelchair: Manual    Wheelchair assist level: Set up assist Max wheelchair distance: >200 ft    Wheelchair 50 feet with 2 turns activity    Assist        Assist Level: Independent   Wheelchair 150 feet activity     Assist      Assist Level: Independent   Blood pressure 126/79, pulse 64, temperature 98 F (36.7 C), temperature source Oral, resp. rate 14, height 6' (1.829 m), weight 128.8 kg, SpO2 94 %.  Medical Problem List and Plan: 1. Functional deficits secondary to polytrauma with Left distal humerus Fx, Sacral/pubic rami FX             -patient may shower               -ELOS/Goals: discharge pending availability of DME--today vs tomorrow   -needs f/u with [facility], ortho 2.  Antithrombotics: -DVT/anticoagulation:  Pharmaceutical: transition to Eliquis 2.5 mg BID X 30 day             -antiplatelet therapy: ASA 3. Pain Management:    -6/30--will increase tramadol frequency back to qid -continue scheduled robaxin -will continue oxycodone scheduled at breakfast and lunch -wean pts as outpt             -have reviewed addiction with pt 4. Mood/Sleep: LCSW to follow for evaluation and support.              -antipsychotic agents: N/A  -trazodone helping with restful sleep--may have at discharge 5. Neuropsych/cognition: This patient  is capable of making decisions on his own behalf. 6. Skin/Wound Care: Routine pressure relief measures.   7. Fluids/Electrolytes/Nutrition: po intake reasonable 8. Left distal humerus Fx s/p ORIF on 08/25/21: NWB LUE. Follow up X rays in 2 weeks --Sling for comfort.  - olecranon displacement on recent films---Dr Haddix reviewed and isn't overly concerned. No change in POC.  9. CAD w/systolic CHF: EF 28-41%. Monitor for signs of overload. Check wts daily.  --Monitor for symptoms with increase in activity.               --continue ASA, Coreg, Lipitor, aldactone and Zestril             -Heart healthy diet 10. New diagnosis T2DM: Hgb A1C- 7.5.  --WIll start monitoring BS ac/hs and use SSI for elevated BS -well controlled 6/30            CBG (last 3)  Recent Labs    09/11/21 0557 09/11/21 2054 09/12/21 0603  GLUCAP 120* 122* 118*    11. Sacral FX: WBAT 12. Acute blood loss anemia: Monitor for signs of bleeding. HGB 11.8             --6/26 HGB stable at 12.2 13.  Morbid obesity-BMI 44: Encourage appropriate diet and activity for wt loss to promote mobility and overall  health.  14. Vitamin D deficiency:  On Ergocalciferol for supplement.  15. Constipation on colace and miralax -moving bowels 16.HLD on Lipitor 17 Acute hypoxic respiratory failure. Resolved. Due to CHF. Had lasix IV x2  18.  HTN Vitals:   09/11/21 1959 09/12/21 0451  BP: 117/74 126/79  Pulse: 65 64  Resp: 14 14  Temp: 97.9 F (36.6 C) 98 F (36.7 C)  SpO2: 95% 94%  Cont Coreg, Lisinopril, aldactone-6/19 6/30 controlled on Lisinopril  20mg  daily, spironolactone 12.5mg  18. Elevated LFTs  -6/22 improving ALT 48 19. Orthostatic hypotension and prerenal azotemia: resolved  LOS: 12 days A FACE TO FACE EVALUATION WAS PERFORMED  7/22 09/12/2021, 9:59 AM

## 2021-09-12 NOTE — Progress Notes (Addendum)
Patient ID: Mathew Oliver, male   DOB: 08/08/1960, 61 y.o.   MRN: [id number]  Spoke with pt and he has yet to hear about equipment delivery. He will text his wife to see if she knows anything. Aware needs equipment before can go home. Emailed workers comp-[employer] to make aware. Keep all updated on information. Pt has been discharged from therapies  11:15 AM According to wife pt's equipment has been delivered and son is on the way to transport pt home. Have let Sandra-PA know and nursing staff aware.

## 2021-09-29 ENCOUNTER — Telehealth: Payer: Self-pay | Admitting: Physical Medicine & Rehabilitation

## 2021-09-29 NOTE — Telephone Encounter (Signed)
Patient's wife is calling office to get a refill for [facility] medications.  Needs a refill on oxycodone and tramadol.  Please call wife when this is done.

## 2021-09-30 MED ORDER — OXYCODONE HCL 5 MG PO TABS: 5.0000 mg | ORAL_TABLET | Freq: Four times a day (QID) | ORAL | 0 refills | Status: DC | PRN

## 2021-09-30 MED ORDER — TRAMADOL HCL 50 MG PO TABS: 100.0000 mg | ORAL_TABLET | Freq: Four times a day (QID) | ORAL | 0 refills | Status: DC | PRN

## 2021-09-30 NOTE — Telephone Encounter (Signed)
Done thx

## 2021-09-30 NOTE — Telephone Encounter (Signed)
Mrs. Carneiro called again for pain medication refill. Patient is out of both medications. Please send to [facility] in [place] on [address].   Filled  Written  ID  Drug  QTY  Days  Prescriber  RX #  Dispenser  Refill  Daily Dose*  Pymt Type  PMP  09/13/2021 09/12/2021 1  Oxycodone Hcl (Ir) 5 Mg Tablet 30.00 7 Za Swa [id number] [facility] ([id number]) 0/0 32.14 MMET Comm Ins [place] 09/12/2021 09/12/2021 2  Tramadol Hcl 50 Mg Tablet 120.00 15 Sa Set [id number] [facility] ([id number]) 0/0 40.00 MMET Comm Ins [place]

## 2021-10-29 ENCOUNTER — Encounter
Payer: No Typology Code available for payment source | Attending: Physical Medicine & Rehabilitation | Admitting: Physical Medicine & Rehabilitation

## 2022-05-26 NOTE — Progress Notes (Signed)
Surgery orders requested via Epic inbox. °

## 2022-05-27 NOTE — Progress Notes (Addendum)
Anesthesia Review:  PCP: none  Cardiologist : Lamar Blinks  Megan Huler LOV 11/27/21  Chest x-ray : CT Chest- 08/23/21  EKG : 08/27/21 and 11/27/21- have requested  by fax to be faxed  Echo : 08/24/21  Stress test: Cardiac Cath :  Activity level: can do a flight of stairs without difficulty  Sleep Study/ CPAP : none  Fasting Blood Sugar :      / Checks Blood Sugar -- times a day:   Blood Thinner/ Instructions /Last Dose: ASA / Instructions/ Last Dose :  325 mg Aspirin- pt has not been given instruction.  Instructed pt to call DR Griffin Basil for preop instrucitons.  PT voiced understanding.

## 2022-05-27 NOTE — Patient Instructions (Signed)
SURGICAL WAITING ROOM VISITATION  Patients having surgery or a procedure may have no more than 2 support people in the waiting area - these visitors may rotate.    Children under the age of 1 must have an adult with them who is not the patient.  Due to an increase in RSV and influenza rates and associated hospitalizations, children ages 23 and under may not visit patients in [place].  If the patient needs to stay at the hospital during part of their recovery, the visitor guidelines for inpatient rooms apply. Pre-op nurse will coordinate an appropriate time for 1 support person to accompany patient in pre-op.  This support person may not rotate.    Please refer to the [facility] website for the visitor guidelines for Inpatients (after your surgery is over and you are in a regular room).       Your procedure is scheduled on:  06/10/22    Report to [facility] Main Entrance    Report to admitting at   0730AM   Call this number if you have problems the morning of surgery [phone]   Do not eat food :After Midnight.   After Midnight you may have the following liquids until ______ AM/ PM DAY OF SURGERY  Water Non-Citrus Juices (without pulp, NO RED-Apple, White grape, White cranberry) Black Coffee (NO MILK/CREAM OR CREAMERS, sugar ok)  Clear Tea (NO MILK/CREAM OR CREAMERS, sugar ok) regular and decaf                             Plain Jell-O (NO RED)                                           Fruit ices (not with fruit pulp, NO RED)                                     Popsicles (NO RED)                                                               Sports drinks like Gatorade (NO RED)              Drink 2 Ensure/G2 drinks AT 10:00 PM the night before surgery.        The day of surgery:  Drink ONE (1) Pre-Surgery Clear Ensure or G2 at AM the morning of surgery. Drink in one sitting. Do not sip.  This drink was given to you during your hospital  pre-op  appointment visit. Nothing else to drink after completing the  Pre-Surgery Clear Ensure or G2.          If you have questions, please contact your surgeon's office.   FOLLOW BOWEL PREP AND ANY ADDITIONAL PRE OP INSTRUCTIONS YOU RECEIVED FROM YOUR SURGEON'S OFFICE!!!     Oral Hygiene is also important to reduce your risk of infection.  Remember - BRUSH YOUR TEETH THE MORNING OF SURGERY WITH YOUR REGULAR TOOTHPASTE  DENTURES WILL BE REMOVED PRIOR TO SURGERY PLEASE DO NOT APPLY "Poly grip" OR ADHESIVES!!!   Do NOT smoke after Midnight   Take these medicines the morning of surgery with A SIP OF WATER:   DO NOT TAKE ANY ORAL DIABETIC MEDICATIONS DAY OF YOUR SURGERY  Bring CPAP mask and tubing day of surgery.                              You may not have any metal on your body including hair pins, jewelry, and body piercing             Do not wear make-up, lotions, powders, perfumes/cologne, or deodorant  Do not wear nail polish including gel and S&S, artificial/acrylic nails, or any other type of covering on natural nails including finger and toenails. If you have artificial nails, gel coating, etc. that needs to be removed by a nail salon please have this removed prior to surgery or surgery may need to be canceled/ delayed if the surgeon/ anesthesia feels like they are unable to be safely monitored.   Do not shave  48 hours prior to surgery.               Men may shave face and neck.   Do not bring valuables to the hospital. [place].   Contacts, glasses, dentures or bridgework may not be worn into surgery.   Bring small overnight bag day of surgery.   DO NOT [place]. PHARMACY WILL DISPENSE MEDICATIONS LISTED ON YOUR MEDICATION LIST TO YOU DURING YOUR ADMISSION [place]!    Patients discharged on the day of surgery will not be allowed to drive home.   Someone NEEDS to stay with you for the first 24 hours after anesthesia.   Special Instructions: Bring a copy of your healthcare power of attorney and living will documents the day of surgery if you haven't scanned them before.              Please read over the following fact sheets you were given: IF [place] [phone]   If you received a COVID test during your pre-op visit  it is requested that you wear a mask when out in public, stay away from anyone that may not be feeling well and notify your surgeon if you develop symptoms. If you test positive for Covid or have been in contact with anyone that has tested positive in the last 10 days please notify you surgeon.    [facility] - Preparing for Surgery Before surgery, you can play an important role.  Because skin is not sterile, your skin needs to be as free of germs as possible.  You can reduce the number of germs on your skin by washing with CHG (chlorahexidine gluconate) soap before surgery.  CHG is an antiseptic cleaner which kills germs and bonds with the skin to continue killing germs even after washing. Please DO NOT use if you have an allergy to CHG or antibacterial soaps.  If your skin becomes reddened/irritated stop using the CHG and inform your nurse when you arrive at Short Stay. Do not shave (including legs and underarms) for at least  48 hours prior to the first CHG shower.  You may shave your face/neck. Please follow these instructions carefully:  1.  Shower with CHG Soap the night before surgery and the  morning of Surgery.  2.  If you choose to wash your hair, wash your hair first as usual with your  normal  shampoo.  3.  After you shampoo, rinse your hair and body thoroughly to remove the  shampoo.                           4.  Use CHG as you would any other liquid soap.  You can apply chg directly  to the skin and wash                       Gently with a scrungie or clean  washcloth.  5.  Apply the CHG Soap to your body ONLY FROM THE NECK DOWN.   Do not use on face/ open                           Wound or open sores. Avoid contact with eyes, ears mouth and genitals (private parts).                       Wash face,  Genitals (private parts) with your normal soap.             6.  Wash thoroughly, paying special attention to the area where your surgery  will be performed.  7.  Thoroughly rinse your body with warm water from the neck down.  8.  DO NOT shower/wash with your normal soap after using and rinsing off  the CHG Soap.                9.  Pat yourself dry with a clean towel.            10.  Wear clean pajamas.            11.  Place clean sheets on your bed the night of your first shower and do not  sleep with pets. Day of Surgery : Do not apply any lotions/deodorants the morning of surgery.  Please wear clean clothes to the hospital/surgery center.  FAILURE TO FOLLOW THESE INSTRUCTIONS MAY RESULT IN THE CANCELLATION OF YOUR SURGERY PATIENT SIGNATURE_________________________________  NURSE SIGNATURE__________________________________  ________________________________________________________________________

## 2022-06-01 ENCOUNTER — Encounter (HOSPITAL_COMMUNITY)
Admission: RE | Admit: 2022-06-01 | Discharge: 2022-06-01 | Disposition: A | Discharge status: Home / Self Care | Payer: No Typology Code available for payment source | Source: Ambulatory Visit | Attending: Orthopaedic Surgery | Admitting: Orthopaedic Surgery

## 2022-06-01 ENCOUNTER — Encounter (HOSPITAL_COMMUNITY): Payer: Self-pay

## 2022-06-01 VITALS — BP 161/90 | HR 64 | Temp 97.7°F | Resp 16 | Ht 72.0 in | Wt 297.0 lb

## 2022-06-01 DIAGNOSIS — F17200 Nicotine dependence, unspecified, uncomplicated: Secondary | ICD-10-CM | POA: Insufficient documentation

## 2022-06-01 DIAGNOSIS — Z951 Presence of aortocoronary bypass graft: Secondary | ICD-10-CM | POA: Insufficient documentation

## 2022-06-01 DIAGNOSIS — Z01812 Encounter for preprocedural laboratory examination: Secondary | ICD-10-CM | POA: Insufficient documentation

## 2022-06-01 DIAGNOSIS — Z955 Presence of coronary angioplasty implant and graft: Secondary | ICD-10-CM | POA: Insufficient documentation

## 2022-06-01 DIAGNOSIS — I5022 Chronic systolic (congestive) heart failure: Secondary | ICD-10-CM | POA: Diagnosis not present

## 2022-06-01 DIAGNOSIS — M7541 Impingement syndrome of right shoulder: Secondary | ICD-10-CM | POA: Insufficient documentation

## 2022-06-01 DIAGNOSIS — I2510 Atherosclerotic heart disease of native coronary artery without angina pectoris: Secondary | ICD-10-CM | POA: Insufficient documentation

## 2022-06-01 DIAGNOSIS — Z01818 Encounter for other preprocedural examination: Secondary | ICD-10-CM

## 2022-06-01 DIAGNOSIS — I110 Hypertensive heart disease with heart failure: Secondary | ICD-10-CM | POA: Insufficient documentation

## 2022-06-01 DIAGNOSIS — M75101 Unspecified rotator cuff tear or rupture of right shoulder, not specified as traumatic: Secondary | ICD-10-CM | POA: Diagnosis not present

## 2022-06-01 LAB — BASIC METABOLIC PANEL
Anion gap: 7 (ref 5–15)
BUN: 17 mg/dL (ref 8–23)
CO2: 25 mmol/L (ref 22–32)
Calcium: 8.6 mg/dL — ABNORMAL LOW (ref 8.9–10.3)
Chloride: 104 mmol/L (ref 98–111)
Creatinine, Ser: 0.78 mg/dL (ref 0.61–1.24)
GFR, Estimated: >60 mL/min (ref >60)
Glucose, Bld: 145 mg/dL — ABNORMAL HIGH (ref 70–99)
Potassium: 4.4 mmol/L (ref 3.5–5.1)
Sodium: 136 mmol/L (ref 135–145)

## 2022-06-01 LAB — CBC
HCT: 45.4 % (ref 39.0–52.0)
Hemoglobin: 15.3 g/dL (ref 13.0–17.0)
MCH: 31.7 pg (ref 26.0–34.0)
MCHC: 33.7 g/dL (ref 30.0–36.0)
MCV: 94 fL (ref 80.0–100.0)
Platelets: 195 10*3/uL (ref 150–400)
RBC: 4.83 MIL/uL (ref 4.22–5.81)
RDW: 12.2 % (ref 11.5–15.5)
WBC: 7.6 10*3/uL (ref 4.0–10.5)
nRBC: 0 % (ref 0.0–0.2)

## 2022-06-02 NOTE — Progress Notes (Addendum)
Anesthesia Chart Review   Case: [id number] Date/Time: 06/10/22 0925   Procedures:      SHOULDER ARTHROSCOPY WITH SUBACROMIAL DECOMPRESSION, ROTATOR CUFF REPAIR AND BICEP TENDON REPAIR (Right)     SHOULDER ARTHROSCOPY WITH DISTAL CLAVICLE EXCISION (Right: Shoulder)   Anesthesia type: General   Pre-op diagnosis: right shoulder cartilage disorder, impingement syndrome, biceps tendinitis, rotator cuff tear   Location: [facility] ROOM 06 / [facility]   Surgeons: Hiram Gash, MD       DISCUSSION:62 y.o. smoker with h/o CAD (CABG, stent), CHF, right shoulder impingement scheduled for above procedure 06/10/2022 with Dr. Ophelia Charter.   Pt last seen by cardiology 11/27/2021. Stable at this visit.   Cardiac clearance requested.  VS: BP (!) 161/90   Pulse 64   Temp 36.5 C (Oral)   Resp 16   Ht 6' (1.829 m)   Wt 134.7 kg   SpO2 98%   BMI 40.28 kg/m   PROVIDERS: Pcp, No   LABS: Labs reviewed: Acceptable for surgery. (all labs ordered are listed, but only abnormal results are displayed)  Labs Reviewed  BASIC METABOLIC PANEL - Abnormal; Notable for the following components:      Result Value   Glucose, Bld 145 (*)    Calcium 8.6 (*)    All other components within normal limits  CBC     IMAGES:   EKG:   CV: Echo 08/24/2021 1. Left ventricular ejection fraction, by estimation, is 40 to 45%. The  left ventricle has mildly decreased function. The left ventricle  demonstrates regional wall motion abnormalities (see scoring  diagram/findings for description). The left ventricular   internal cavity size was mildly dilated. There is mild concentric left  ventricular hypertrophy. Left ventricular diastolic parameters are  consistent with Grade II diastolic dysfunction (pseudonormalization).  There is hypokinesis of the left ventricular,   basal-mid inferoseptal wall and inferior wall.   2. Right ventricular systolic function is normal. The right ventricular  size is mildly enlarged.  Tricuspid regurgitation signal is inadequate for  assessing PA pressure.   3. Left atrial size was moderately dilated.   4. Right atrial size was mildly dilated.   5. The mitral valve was not well visualized. Trivial mitral valve  regurgitation. No evidence of mitral stenosis.   6. The aortic valve was not well visualized. Aortic valve regurgitation  is not visualized. No aortic stenosis is present.  Past Medical History:  Diagnosis Date   Chronic systolic (congestive) heart failure (HCC)    Heart disease    LAD occlsuion   High blood pressure    MI, acute, non ST segment elevation (HCC)     Past Surgical History:  Procedure Laterality Date   CARDIAC CATHETERIZATION     PTCA- LAD   ORIF HUMERUS FRACTURE Left 08/25/2021   Procedure: OPEN REDUCTION INTERNAL FIXATION (ORIF) DISTAL HUMERUS FRACTURE;  Surgeon: Shona Needles, MD;  Location: [facility];  Service: Orthopedics;  Laterality: Left;    MEDICATIONS:  acetaminophen (TYLENOL) 325 MG tablet   aspirin EC 325 MG tablet   atorvastatin (LIPITOR) 40 MG tablet   blood glucose meter kit and supplies   carvedilol (COREG) 25 MG tablet   ibuprofen (ADVIL) 200 MG tablet   lisinopril (ZESTRIL) 40 MG tablet   multivitamin (ONE-A-DAY MEN'S) TABS tablet   omeprazole (PRILOSEC OTC) 20 MG tablet   psyllium (METAMUCIL) 58.6 % powder   spironolactone (ALDACTONE) 25 MG tablet   No current facility-administered medications for this encounter.

## 2022-06-04 NOTE — H&P (Signed)
PREOPERATIVE H&P  Chief Complaint: right shoulder cartilage disorder, impingement syndrome, biceps tendinitis, rotator cuff tear  HPI: Mathew Oliver is a 62 y.o. male who is scheduled for, Procedure(s): SHOULDER ARTHROSCOPY WITH SUBACROMIAL DECOMPRESSION, ROTATOR CUFF REPAIR AND BICEP TENDON REPAIR SHOULDER ARTHROSCOPY WITH DISTAL CLAVICLE EXCISION.   Patient has a past medical history significant for CHF, MI, HTN.   The patient is a 62 year old who fell off a ladder on 08-22-21. He had immediate pain in the right shoulder. He caught himself with his right shoulder and landed on his left elbow which he fractured. He had an open reduction and internal fixation of the left distal humerus and he did well with that. He still has pain in the right shoulder. He has tried and failed injections in therapy.  He unfortunately is no making progress.   Symptoms are rated as moderate to severe, and have been worsening.  This is significantly impairing activities of daily living.    Please see clinic note for further details on this patient's care.    He has elected for surgical management.   Past Medical History:  Diagnosis Date   Chronic systolic (congestive) heart failure (HCC)    Heart disease    LAD occlsuion   High blood pressure    MI, acute, non ST segment elevation (HCC)    Past Surgical History:  Procedure Laterality Date   CARDIAC CATHETERIZATION     PTCA- LAD   ORIF HUMERUS FRACTURE Left 08/25/2021   Procedure: OPEN REDUCTION INTERNAL FIXATION (ORIF) DISTAL HUMERUS FRACTURE;  Surgeon: Shona Needles, MD;  Location: [facility];  Service: Orthopedics;  Laterality: Left;   Social History   Socioeconomic History   Marital status: Married    Spouse name: Not on file   Number of children: Not on file   Years of education: Not on file   Highest education level: Not on file  Occupational History   Not on file  Tobacco Use   Smoking status: Every Day    Packs/day: 1.00     Years: 46.00    Additional pack years: 0.00    Total pack years: 46.00    Types: Cigarettes   Smokeless tobacco: Never  Vaping Use   Vaping Use: Never used  Substance and Sexual Activity   Alcohol use: Yes    Comment: 2-3 beers every other weekend   Drug use: Never   Sexual activity: Not on file  Other Topics Concern   Not on file  Social History Narrative   Not on file   Social Determinants of Health   Financial Resource Strain: Not on file  Food Insecurity: Not on file  Transportation Needs: Not on file  Physical Activity: Not on file  Stress: Not on file  Social Connections: Not on file   Family History  Problem Relation Age of Onset   Heart attack Mother    Heart attack Father    Cancer Sister    Heart attack Brother    Allergies  Allergen Reactions   Niacin And Related     Flushing    Prior to Admission medications   Medication Sig Start Date End Date Taking? Authorizing Provider  acetaminophen (TYLENOL) 325 MG tablet Take 1-2 tablets (325-650 mg total) by mouth every 4 (four) hours as needed for mild pain. 09/11/21  Yes Angiulli, Lavon Paganini, PA-C  aspirin EC 325 MG tablet Take 1 tablet (325 mg total) by mouth at bedtime. 09/11/21  Yes  AngiulliLavon Paganini, PA-C  atorvastatin (LIPITOR) 40 MG tablet Take 1 tablet (40 mg total) by mouth every evening. 09/11/21  Yes Angiulli, Lavon Paganini, PA-C  carvedilol (COREG) 25 MG tablet Take 1 tablet (25 mg total) by mouth 2 (two) times daily with a meal. 09/11/21  Yes Angiulli, Lavon Paganini, PA-C  ibuprofen (ADVIL) 200 MG tablet Take 400 mg by mouth every 6 (six) hours as needed for moderate pain.   Yes [provider]  lisinopril (ZESTRIL) 40 MG tablet Take 1 tablet (40 mg total) by mouth every evening. 09/11/21  Yes Angiulli, Lavon Paganini, PA-C  multivitamin (ONE-A-DAY MEN'S) TABS tablet Take 1 tablet by mouth daily. 09/11/21  Yes Angiulli, Lavon Paganini, PA-C  omeprazole (PRILOSEC OTC) 20 MG tablet Take 20 mg by mouth daily as needed (acid  reflux).   Yes [provider]  psyllium (METAMUCIL) 58.6 % powder Take 1 packet by mouth at bedtime.   Yes [provider]  spironolactone (ALDACTONE) 25 MG tablet Take 0.5 tablets (12.5 mg total) by mouth every evening. Patient taking differently: Take 25 mg by mouth every evening. 09/11/21  Yes Angiulli, Lavon Paganini, PA-C  blood glucose meter kit and supplies Dispense based on patient and insurance preference. Use up to four times daily as directed. (FOR ICD-10 E10.9, E11.9). 09/12/21   Setzer, Edman Circle, PA-C    ROS: All other systems have been reviewed and were otherwise negative with the exception of those mentioned in the HPI and as above.  Physical Exam: General: Alert, no acute distress Cardiovascular: No pedal edema Respiratory: No cyanosis, no use of accessory musculature GI: No organomegaly, abdomen is soft and non-tender Skin: No lesions in the area of chief complaint Neurologic: Sensation intact distally Psychiatric: Patient is competent for consent with normal mood and affect Lymphatic: No axillary or cervical lymphadenopathy  MUSCULOSKELETAL:  On examination the range of motion of the right shoulder to 170 degrees. He has positive gross pain around the bicipital groove. AC tenderness to palpation, impingement. There is 4/5 supraspinatus strength.  Imaging: MRI demonstrates a high grade partial thickness supraspinatus tear. SLAP tear. Posterior labral tear. Edema around the distal clavicle and biceps tendinitis with signs of impingement.   Assessment: right shoulder cartilage disorder, impingement syndrome, biceps tendinitis, rotator cuff tear  Plan: Plan for Procedure(s): SHOULDER ARTHROSCOPY WITH SUBACROMIAL DECOMPRESSION, ROTATOR CUFF REPAIR AND BICEP TENDON REPAIR SHOULDER ARTHROSCOPY WITH DISTAL CLAVICLE EXCISION  The risks benefits and alternatives were discussed with the patient including but not limited to the risks of nonoperative treatment, versus  surgical intervention including infection, bleeding, nerve injury,  blood clots, cardiopulmonary complications, morbidity, mortality, among others, and they were willing to proceed.   The patient acknowledged the explanation, agreed to proceed with the plan and consent was signed.   He received operative clearance from his PCP, Cathlean Sauer NP.   Operative Plan: Right shoulder scope with rotator cuff repair, biceps tenodesis, distal clavicle excision and subacromial decompression  Discharge Medications: standard DVT Prophylaxis: none Physical Therapy: outpatient PT Special Discharge needs: sling. IceMan   Ethelda Chick, PA-C  06/04/2022 1:23 PM

## 2022-06-10 ENCOUNTER — Ambulatory Visit (HOSPITAL_COMMUNITY): Payer: No Typology Code available for payment source | Admitting: Physician Assistant

## 2022-06-10 ENCOUNTER — Ambulatory Visit (HOSPITAL_BASED_OUTPATIENT_CLINIC_OR_DEPARTMENT_OTHER): Payer: No Typology Code available for payment source | Admitting: Certified Registered Nurse Anesthetist

## 2022-06-10 ENCOUNTER — Encounter (HOSPITAL_COMMUNITY): Payer: Self-pay | Admitting: Orthopaedic Surgery

## 2022-06-10 ENCOUNTER — Ambulatory Visit (HOSPITAL_COMMUNITY)
Admission: RE | Admit: 2022-06-10 | Discharge: 2022-06-10 | Disposition: A | Discharge status: Home / Self Care | Payer: No Typology Code available for payment source | Source: Ambulatory Visit | Attending: Orthopaedic Surgery | Admitting: Orthopaedic Surgery

## 2022-06-10 ENCOUNTER — Other Ambulatory Visit: Payer: Self-pay

## 2022-06-10 ENCOUNTER — Encounter (HOSPITAL_COMMUNITY)
Admission: RE | Disposition: A | Discharge status: Home / Self Care | Payer: Self-pay | Source: Ambulatory Visit | Attending: Orthopaedic Surgery

## 2022-06-10 DIAGNOSIS — M75101 Unspecified rotator cuff tear or rupture of right shoulder, not specified as traumatic: Secondary | ICD-10-CM | POA: Diagnosis not present

## 2022-06-10 DIAGNOSIS — S46011A Strain of muscle(s) and tendon(s) of the rotator cuff of right shoulder, initial encounter: Secondary | ICD-10-CM | POA: Diagnosis not present

## 2022-06-10 DIAGNOSIS — M19011 Primary osteoarthritis, right shoulder: Secondary | ICD-10-CM | POA: Insufficient documentation

## 2022-06-10 DIAGNOSIS — M7541 Impingement syndrome of right shoulder: Secondary | ICD-10-CM | POA: Insufficient documentation

## 2022-06-10 DIAGNOSIS — F17210 Nicotine dependence, cigarettes, uncomplicated: Secondary | ICD-10-CM | POA: Diagnosis not present

## 2022-06-10 DIAGNOSIS — M7521 Bicipital tendinitis, right shoulder: Secondary | ICD-10-CM | POA: Insufficient documentation

## 2022-06-10 DIAGNOSIS — I252 Old myocardial infarction: Secondary | ICD-10-CM | POA: Insufficient documentation

## 2022-06-10 DIAGNOSIS — W11XXXA Fall on and from ladder, initial encounter: Secondary | ICD-10-CM | POA: Diagnosis not present

## 2022-06-10 DIAGNOSIS — I5022 Chronic systolic (congestive) heart failure: Secondary | ICD-10-CM | POA: Insufficient documentation

## 2022-06-10 DIAGNOSIS — Z01818 Encounter for other preprocedural examination: Secondary | ICD-10-CM

## 2022-06-10 DIAGNOSIS — I110 Hypertensive heart disease with heart failure: Secondary | ICD-10-CM | POA: Insufficient documentation

## 2022-06-10 DIAGNOSIS — Z6841 Body Mass Index (BMI) 40.0 and over, adult: Secondary | ICD-10-CM

## 2022-06-10 DIAGNOSIS — M24111 Other articular cartilage disorders, right shoulder: Secondary | ICD-10-CM | POA: Diagnosis not present

## 2022-06-10 DIAGNOSIS — I2510 Atherosclerotic heart disease of native coronary artery without angina pectoris: Secondary | ICD-10-CM | POA: Insufficient documentation

## 2022-06-10 HISTORY — PX: SHOULDER ARTHROSCOPY WITH SUBACROMIAL DECOMPRESSION, ROTATOR CUFF REPAIR AND BICEP TENDON REPAIR: SHX5687

## 2022-06-10 HISTORY — PX: SHOULDER ARTHROSCOPY WITH DISTAL CLAVICLE RESECTION: SHX5675

## 2022-06-10 SURGERY — SHOULDER ARTHROSCOPY WITH SUBACROMIAL DECOMPRESSION, ROTATOR CUFF REPAIR AND BICEP TENDON REPAIR
Anesthesia: Regional | Site: Shoulder | Laterality: Right

## 2022-06-10 MED ORDER — HYDRALAZINE HCL 20 MG/ML IJ SOLN
10.0000 mg | Freq: Once | INTRAMUSCULAR | Status: AC
  Administered 2022-06-10: 10 mg via INTRAVENOUS

## 2022-06-10 MED ORDER — LIDOCAINE HCL (PF) 2 % IJ SOLN
INTRAMUSCULAR | Status: AC
  Filled 2022-06-10: qty 5

## 2022-06-10 MED ORDER — EPHEDRINE SULFATE-NACL 50-0.9 MG/10ML-% IV SOSY
PREFILLED_SYRINGE | INTRAVENOUS | Status: DC | PRN
  Administered 2022-06-10: 10 mg via INTRAVENOUS

## 2022-06-10 MED ORDER — ONDANSETRON HCL 4 MG/2ML IJ SOLN
INTRAMUSCULAR | Status: AC
  Filled 2022-06-10: qty 2

## 2022-06-10 MED ORDER — FENTANYL CITRATE PF 50 MCG/ML IJ SOSY: 25.0000 ug | PREFILLED_SYRINGE | INTRAMUSCULAR | Status: DC | PRN

## 2022-06-10 MED ORDER — OXYCODONE HCL 5 MG PO TABS: 5.0000 mg | ORAL_TABLET | Freq: Once | ORAL | Status: DC | PRN

## 2022-06-10 MED ORDER — SODIUM CHLORIDE 0.9 % IR SOLN
Status: DC | PRN
  Administered 2022-06-10 (×4): 3000 mL

## 2022-06-10 MED ORDER — FENTANYL CITRATE (PF) 100 MCG/2ML IJ SOLN
INTRAMUSCULAR | Status: AC
  Filled 2022-06-10: qty 2

## 2022-06-10 MED ORDER — EPINEPHRINE PF 1 MG/ML IJ SOLN
INTRAMUSCULAR | Status: DC | PRN
  Administered 2022-06-10: 1 mg

## 2022-06-10 MED ORDER — DEXAMETHASONE SODIUM PHOSPHATE 10 MG/ML IJ SOLN
INTRAMUSCULAR | Status: AC
  Filled 2022-06-10: qty 1

## 2022-06-10 MED ORDER — ROCURONIUM BROMIDE 10 MG/ML (PF) SYRINGE
PREFILLED_SYRINGE | INTRAVENOUS | Status: DC | PRN
  Administered 2022-06-10: 30 mg via INTRAVENOUS
  Administered 2022-06-10: 70 mg via INTRAVENOUS

## 2022-06-10 MED ORDER — TRANEXAMIC ACID-NACL 1000-0.7 MG/100ML-% IV SOLN
1000.0000 mg | INTRAVENOUS | Status: AC
  Administered 2022-06-10: 1000 mg via INTRAVENOUS
  Filled 2022-06-10: qty 100

## 2022-06-10 MED ORDER — BUPIVACAINE HCL (PF) 0.5 % IJ SOLN
INTRAMUSCULAR | Status: DC | PRN
  Administered 2022-06-10: 15 mL via PERINEURAL

## 2022-06-10 MED ORDER — ACETAMINOPHEN 500 MG PO TABS: 1000.0000 mg | ORAL_TABLET | Freq: Three times a day (TID) | ORAL | 0 refills | Status: AC

## 2022-06-10 MED ORDER — DEXAMETHASONE SODIUM PHOSPHATE 10 MG/ML IJ SOLN
INTRAMUSCULAR | Status: DC | PRN
  Administered 2022-06-10: 8 mg via INTRAVENOUS

## 2022-06-10 MED ORDER — LACTATED RINGERS IV SOLN: INTRAVENOUS | Status: DC

## 2022-06-10 MED ORDER — ONDANSETRON HCL 4 MG PO TABS: 4.0000 mg | ORAL_TABLET | Freq: Three times a day (TID) | ORAL | 0 refills | Status: AC | PRN

## 2022-06-10 MED ORDER — HYDRALAZINE HCL 20 MG/ML IJ SOLN
INTRAMUSCULAR | Status: AC
  Filled 2022-06-10: qty 1

## 2022-06-10 MED ORDER — CHLORHEXIDINE GLUCONATE 0.12 % MT SOLN
15.0000 mL | Freq: Once | OROMUCOSAL | Status: AC
  Administered 2022-06-10: 15 mL via OROMUCOSAL

## 2022-06-10 MED ORDER — PHENYLEPHRINE HCL-NACL 20-0.9 MG/250ML-% IV SOLN
INTRAVENOUS | Status: DC | PRN
  Administered 2022-06-10: 25 ug/min via INTRAVENOUS

## 2022-06-10 MED ORDER — ACETAMINOPHEN 160 MG/5ML PO SOLN: 325.0000 mg | ORAL | Status: DC | PRN

## 2022-06-10 MED ORDER — OXYCODONE HCL 5 MG/5ML PO SOLN: 5.0000 mg | Freq: Once | ORAL | Status: DC | PRN

## 2022-06-10 MED ORDER — PROPOFOL 10 MG/ML IV BOLUS
INTRAVENOUS | Status: DC | PRN
  Administered 2022-06-10: 150 mg via INTRAVENOUS
  Administered 2022-06-10: 50 mg via INTRAVENOUS

## 2022-06-10 MED ORDER — EPINEPHRINE PF 1 MG/ML IJ SOLN
INTRAMUSCULAR | Status: AC
  Filled 2022-06-10: qty 4

## 2022-06-10 MED ORDER — PROPOFOL 10 MG/ML IV BOLUS
INTRAVENOUS | Status: AC
  Filled 2022-06-10: qty 20

## 2022-06-10 MED ORDER — ACETAMINOPHEN 500 MG PO TABS
1000.0000 mg | ORAL_TABLET | Freq: Once | ORAL | Status: AC
  Administered 2022-06-10: 1000 mg via ORAL
  Filled 2022-06-10: qty 2

## 2022-06-10 MED ORDER — MIDAZOLAM HCL 2 MG/2ML IJ SOLN
1.0000 mg | Freq: Once | INTRAMUSCULAR | Status: AC
  Administered 2022-06-10: 2 mg via INTRAVENOUS
  Filled 2022-06-10: qty 2

## 2022-06-10 MED ORDER — EPHEDRINE 5 MG/ML INJ
INTRAVENOUS | Status: AC
  Filled 2022-06-10: qty 5

## 2022-06-10 MED ORDER — GABAPENTIN 100 MG PO CAPS: 100.0000 mg | ORAL_CAPSULE | Freq: Three times a day (TID) | ORAL | 0 refills | Status: AC

## 2022-06-10 MED ORDER — PHENYLEPHRINE HCL (PRESSORS) 10 MG/ML IV SOLN
INTRAVENOUS | Status: AC
  Filled 2022-06-10: qty 1

## 2022-06-10 MED ORDER — CEFAZOLIN IN SODIUM CHLORIDE 3-0.9 GM/100ML-% IV SOLN
3.0000 g | INTRAVENOUS | Status: AC
  Administered 2022-06-10: 3 g via INTRAVENOUS
  Filled 2022-06-10: qty 100

## 2022-06-10 MED ORDER — ORAL CARE MOUTH RINSE: 15.0000 mL | Freq: Once | OROMUCOSAL | Status: AC

## 2022-06-10 MED ORDER — ONDANSETRON HCL 4 MG/2ML IJ SOLN
INTRAMUSCULAR | Status: DC | PRN
  Administered 2022-06-10: 4 mg via INTRAVENOUS

## 2022-06-10 MED ORDER — OXYCODONE HCL 5 MG PO TABS: ORAL_TABLET | ORAL | 0 refills | Status: AC

## 2022-06-10 MED ORDER — BUPIVACAINE LIPOSOME 1.3 % IJ SUSP
INTRAMUSCULAR | Status: DC | PRN
  Administered 2022-06-10: 10 mL via PERINEURAL

## 2022-06-10 MED ORDER — ROCURONIUM BROMIDE 10 MG/ML (PF) SYRINGE
PREFILLED_SYRINGE | INTRAVENOUS | Status: AC
  Filled 2022-06-10: qty 10

## 2022-06-10 MED ORDER — MEPERIDINE HCL 50 MG/ML IJ SOLN: 6.2500 mg | INTRAMUSCULAR | Status: DC | PRN

## 2022-06-10 MED ORDER — FENTANYL CITRATE PF 50 MCG/ML IJ SOSY
50.0000 ug | PREFILLED_SYRINGE | Freq: Once | INTRAMUSCULAR | Status: AC
  Filled 2022-06-10: qty 2

## 2022-06-10 MED ORDER — LIDOCAINE 2% (20 MG/ML) 5 ML SYRINGE
INTRAMUSCULAR | Status: DC | PRN
  Administered 2022-06-10: 100 mg via INTRAVENOUS

## 2022-06-10 MED ORDER — FENTANYL CITRATE PF 50 MCG/ML IJ SOSY
PREFILLED_SYRINGE | INTRAMUSCULAR | Status: AC
  Administered 2022-06-10: 100 ug via INTRAVENOUS
  Filled 2022-06-10: qty 1

## 2022-06-10 MED ORDER — SUGAMMADEX SODIUM 200 MG/2ML IV SOLN
INTRAVENOUS | Status: DC | PRN
  Administered 2022-06-10: 270 mg via INTRAVENOUS

## 2022-06-10 MED ORDER — FENTANYL CITRATE (PF) 100 MCG/2ML IJ SOLN
INTRAMUSCULAR | Status: DC | PRN
  Administered 2022-06-10: 50 ug via INTRAVENOUS

## 2022-06-10 MED ORDER — ONDANSETRON HCL 4 MG/2ML IJ SOLN: 4.0000 mg | Freq: Once | INTRAMUSCULAR | Status: DC | PRN

## 2022-06-10 MED ORDER — BUPIVACAINE HCL 0.25 % IJ SOLN
INTRAMUSCULAR | Status: AC
  Filled 2022-06-10: qty 1

## 2022-06-10 MED ORDER — ACETAMINOPHEN 325 MG PO TABS: 325.0000 mg | ORAL_TABLET | ORAL | Status: DC | PRN

## 2022-06-10 MED ORDER — CELECOXIB 100 MG PO CAPS: 100.0000 mg | ORAL_CAPSULE | Freq: Two times a day (BID) | ORAL | 0 refills | Status: AC

## 2022-06-10 SURGICAL SUPPLY — 97 items
AID PSTN UNV HD RSTRNT DISP (MISCELLANEOUS) ×2
ANCH SUT 2 FBRTK KNTLS 1.8 (Anchor) ×8 IMPLANT
ANCH SUT SWLK 19.1X4.75 (Anchor) ×4 IMPLANT
ANCHOR SUT 1.8 FIBERTAK SB KL (Anchor) IMPLANT
ANCHOR SUT BIO SW 4.75X19.1 (Anchor) IMPLANT
APL PRP STRL LF DISP 70% ISPRP (MISCELLANEOUS) ×2
APL SKNCLS STERI-STRIP NONHPOA (GAUZE/BANDAGES/DRESSINGS) ×2
BAG COUNTER SPONGE SURGICOUNT (BAG) ×2 IMPLANT
BAG SPNG CNTER NS LX DISP (BAG) ×2
BENZOIN TINCTURE PRP APPL 2/3 (GAUZE/BANDAGES/DRESSINGS) ×2 IMPLANT
BLADE EXCALIBUR 4.0X13 (MISCELLANEOUS) ×2 IMPLANT
BLADE HEX COATED 2.75 (ELECTRODE) IMPLANT
BLADE SHAVER BONE 5.0X13 (MISCELLANEOUS) IMPLANT
BLADE SURG 15 STRL LF DISP TIS (BLADE) IMPLANT
BLADE SURG 15 STRL SS (BLADE)
BLADE SURG SZ10 CARB STEEL (BLADE) IMPLANT
BNDG CMPR 5X4 CHSV STRCH STRL (GAUZE/BANDAGES/DRESSINGS)
BNDG COHESIVE 4X5 TAN STRL LF (GAUZE/BANDAGES/DRESSINGS) IMPLANT
BURR OVAL 8 FLU 4.0X13 (MISCELLANEOUS) ×2 IMPLANT
BURR OVAL 8 FLU 5.0X13 (MISCELLANEOUS) IMPLANT
CANNULA 5.75X71 LONG (CANNULA) IMPLANT
CANNULA PASSPORT 5 (CANNULA) IMPLANT
CANNULA PASSPORT BUTTON 10-40 (CANNULA) IMPLANT
CANNULA TWIST IN 8.25X7CM (CANNULA) IMPLANT
CHLORAPREP W/TINT 26 (MISCELLANEOUS) ×2 IMPLANT
CLSR STERI-STRIP ANTIMIC 1/2X4 (GAUZE/BANDAGES/DRESSINGS) ×2 IMPLANT
COOLER ICEMAN CLASSIC (MISCELLANEOUS) IMPLANT
DISSECTOR  3.8MM X 13CM (MISCELLANEOUS) ×2
DISSECTOR 3.5MM X 13CM CVD (MISCELLANEOUS) IMPLANT
DISSECTOR 3.8MM X 13CM (MISCELLANEOUS) ×2 IMPLANT
DISSECTOR 4.0MMX13CM CVD (MISCELLANEOUS) IMPLANT
DRAPE IMP U-DRAPE 54X76 (DRAPES) ×2 IMPLANT
DRAPE INCISE IOBAN 66X45 STRL (DRAPES) IMPLANT
DRAPE ORTHO SPLIT 77X108 STRL (DRAPES) ×4
DRAPE SHEET LG 3/4 BI-LAMINATE (DRAPES) IMPLANT
DRAPE SHOULDER BEACH CHAIR (DRAPES) ×2 IMPLANT
DRAPE STERI 35X30 U-POUCH (DRAPES) ×2 IMPLANT
DRAPE SURG ORHT 6 SPLT 77X108 (DRAPES) ×4 IMPLANT
DRAPE U-SHAPE 47X51 STRL (DRAPES) ×2 IMPLANT
DW OUTFLOW CASSETTE/TUBE SET (MISCELLANEOUS) ×2 IMPLANT
ELECT MENISCUS 165MM 90D (ELECTRODE) ×2 IMPLANT
ELECT NDL TIP 2.8 STRL (NEEDLE) IMPLANT
ELECT NEEDLE TIP 2.8 STRL (NEEDLE) IMPLANT
ELECT REM PT RETURN 15FT ADLT (MISCELLANEOUS) ×2 IMPLANT
GAUZE PAD ABD 7.5X8 STRL (GAUZE/BANDAGES/DRESSINGS) IMPLANT
GAUZE PAD ABD 8X10 STRL (GAUZE/BANDAGES/DRESSINGS) ×4 IMPLANT
GAUZE SPONGE 4X4 12PLY STRL (GAUZE/BANDAGES/DRESSINGS) ×2 IMPLANT
GLOVE BIO SURGEON STRL SZ 6.5 (GLOVE) ×4 IMPLANT
GLOVE BIOGEL PI IND STRL 6.5 (GLOVE) ×2 IMPLANT
GLOVE BIOGEL PI IND STRL 8 (GLOVE) ×2 IMPLANT
GLOVE ECLIPSE 8.0 STRL XLNG CF (GLOVE) ×2 IMPLANT
GOWN STRL REUS W/ TWL LRG LVL3 (GOWN DISPOSABLE) ×4 IMPLANT
GOWN STRL REUS W/TWL LRG LVL3 (GOWN DISPOSABLE) ×4
IV NS IRRIG 3000ML ARTHROMATIC (IV SOLUTION) ×8 IMPLANT
KIT BASIN OR (CUSTOM PROCEDURE TRAY) ×2 IMPLANT
KIT STABILIZATION SHOULDER (MISCELLANEOUS) ×2 IMPLANT
KIT STR SPEAR 1.8 FBRTK DISP (KITS) IMPLANT
KIT TURNOVER KIT A (KITS) IMPLANT
LASSO CRESCENT QUICKPASS (SUTURE) IMPLANT
MANIFOLD NEPTUNE II (INSTRUMENTS) ×2 IMPLANT
NDL SAFETY ECLIP 18X1.5 (MISCELLANEOUS) ×2 IMPLANT
NDL SCORPION MULTI FIRE (NEEDLE) IMPLANT
NDL SUT 6 .5 CRC .975X.05 MAYO (NEEDLE) IMPLANT
NEEDLE MAYO TAPER (NEEDLE)
NEEDLE SCORPION MULTI FIRE (NEEDLE) ×2 IMPLANT
PACK ARTHROSCOPY DSU (CUSTOM PROCEDURE TRAY) ×2 IMPLANT
PACK ARTHROSCOPY WL (CUSTOM PROCEDURE TRAY) ×2 IMPLANT
PAD COLD SHLDR WRAP-ON (PAD) IMPLANT
PORT APPOLLO RF 90DEGREE MULTI (SURGICAL WAND) ×2 IMPLANT
RESTRAINT HEAD UNIVERSAL NS (MISCELLANEOUS) ×2 IMPLANT
SLEEVE SCD COMPRESS KNEE MED (STOCKING) ×2 IMPLANT
SLING ARM FOAM STRAP LRG (SOFTGOODS) IMPLANT
SLING ARM FOAM STRAP MED (SOFTGOODS) IMPLANT
SLING ARM FOAM STRAP SML (SOFTGOODS) IMPLANT
SLING ARM FOAM STRAP XLG (SOFTGOODS) IMPLANT
SLING ARM IMMOBILIZER LRG (SOFTGOODS) IMPLANT
SLING ARM IMMOBILIZER MED (SOFTGOODS) IMPLANT
SPIKE FLUID TRANSFER (MISCELLANEOUS) IMPLANT
SPONGE T-LAP 4X18 ~~LOC~~+RFID (SPONGE) ×2 IMPLANT
STRIP CLOSURE SKIN 1/2X4 (GAUZE/BANDAGES/DRESSINGS) IMPLANT
SUT ETHILON 3 0 PS 1 (SUTURE) IMPLANT
SUT FIBERWIRE #2 38 T-5 BLUE (SUTURE)
SUT MNCRL AB 4-0 PS2 18 (SUTURE) ×2 IMPLANT
SUT PDS AB 1 CT  36 (SUTURE) ×2
SUT PDS AB 1 CT 36 (SUTURE) IMPLANT
SUT TIGER TAPE 7 IN WHITE (SUTURE) IMPLANT
SUTURE FIBERWR #2 38 T-5 BLUE (SUTURE) IMPLANT
SUTURE TAPE TIGERLINK 1.3MM BL (SUTURE) IMPLANT
SUTURETAPE TIGERLINK 1.3MM BL (SUTURE)
SYR 10ML LL (SYRINGE) ×2 IMPLANT
TAPE CLOTH SURG 6X10 WHT LF (GAUZE/BANDAGES/DRESSINGS) IMPLANT
TAPE FIBER 2MM 7IN #2 BLUE (SUTURE) IMPLANT
TOWEL OR 17X26 10 PK STRL BLUE (TOWEL DISPOSABLE) ×2 IMPLANT
TUBE SUCTION HIGH CAP CLEAR NV (SUCTIONS) ×2 IMPLANT
TUBING ARTHROSCOPY IRRIG 16FT (MISCELLANEOUS) ×2 IMPLANT
TUBING CONNECTING 10 (TUBING) ×2 IMPLANT
WATER STERILE IRR 1000ML POUR (IV SOLUTION) ×2 IMPLANT

## 2022-06-10 NOTE — Anesthesia Preprocedure Evaluation (Addendum)
Anesthesia Evaluation  Patient identified by MRN, date of birth, ID band Patient awake    Reviewed: Allergy & Precautions, H&P , NPO status , Patient's Chart, lab work & pertinent test results  Airway Mallampati: III  TM Distance: >3 FB Neck ROM: Full  Mouth opening: Limited Mouth Opening  Dental no notable dental hx. (+) Poor Dentition, Chipped, Missing,    Pulmonary neg pulmonary ROS, Current Smoker and Patient abstained from smoking.   Pulmonary exam normal breath sounds clear to auscultation       Cardiovascular Exercise Tolerance: Good hypertension, Pt. on medications + CAD, + Past MI and +CHF  negative cardio ROS Normal cardiovascular exam Rhythm:Regular Rate:Normal     Neuro/Psych negative neurological ROS  negative psych ROS   GI/Hepatic negative GI ROS, Neg liver ROS,,,  Endo/Other  negative endocrine ROS  Morbid obesity  Renal/GU negative Renal ROS  negative genitourinary   Musculoskeletal negative musculoskeletal ROS (+)    Abdominal   Peds negative pediatric ROS (+)  Hematology negative hematology ROS (+)   Anesthesia Other Findings   Reproductive/Obstetrics negative OB ROS                             Anesthesia Physical Anesthesia Plan  ASA: 3  Anesthesia Plan: General and Regional   Post-op Pain Management: Regional block* and Minimal or no pain anticipated   Induction: Intravenous  PONV Risk Score and Plan: 2 and Ondansetron, Dexamethasone, Midazolam and Treatment may vary due to age or medical condition  Airway Management Planned: Oral ETT and LMA  Additional Equipment: None  Intra-op Plan:   Post-operative Plan: Extubation in OR  Informed Consent: I have reviewed the patients History and Physical, chart, labs and discussed the procedure including the risks, benefits and alternatives for the proposed anesthesia with the patient or authorized representative  who has indicated his/her understanding and acceptance.     Dental advisory given  Plan Discussed with: Anesthesiologist and CRNA  Anesthesia Plan Comments: (Mathew Oliver y.o. smoker with h/o CAD (CABG, stent), CHF, right shoulder impingement scheduled for above procedure 06/10/2022 with Dr. Ophelia Charter.    Pt last seen by cardiology 11/27/2021. Stable at this visit.    Cardiac clearance requested.   CV: Echo 08/24/2021 1. Left ventricular ejection fraction, by estimation, is 40 to 45%. The  left ventricle has mildly decreased function. The left ventricle  demonstrates regional wall motion abnormalities (see scoring  diagram/findings for description). The left ventricular   internal cavity size was mildly dilated. There is mild concentric left  ventricular hypertrophy. Left ventricular diastolic parameters are  consistent with Grade II diastolic dysfunction (pseudonormalization).  There is hypokinesis of the left ventricular,   basal-mid inferoseptal wall and inferior wall.   2. Right ventricular systolic function is normal. The right ventricular  size is mildly enlarged. Tricuspid regurgitation signal is inadequate for  assessing PA pressure.   3. Left atrial size was moderately dilated.   4. Right atrial size was mildly dilated.   5. The mitral valve was not well visualized. Trivial mitral valve  regurgitation. No evidence of mitral stenosis.   6. The aortic valve was not well visualized. Aortic valve regurgitation  is not visualized. No aortic stenosis is present.    )       Anesthesia Quick Evaluation

## 2022-06-10 NOTE — Anesthesia Procedure Notes (Signed)
Procedure Name: Intubation Date/Time: 06/10/2022 10:39 AM  Performed by: Niel Hummer, CRNAPre-anesthesia Checklist: Patient identified, Emergency Drugs available, Suction available and Patient being monitored Patient Re-evaluated:Patient Re-evaluated prior to induction Oxygen Delivery Method: Circle system utilized Preoxygenation: Pre-oxygenation with 100% oxygen Induction Type: IV induction Ventilation: Mask ventilation without difficulty and Oral airway inserted - appropriate to patient size Laryngoscope Size: Mac and 4 Grade View: Grade I Tube type: Oral Tube size: 7.5 mm Number of attempts: 2 Airway Equipment and Method: Stylet Placement Confirmation: ETT inserted through vocal cords under direct vision, positive ETCO2 and breath sounds checked- equal and bilateral Secured at: 25 cm Tube secured with: Tape Dental Injury: Teeth and Oropharynx as per pre-operative assessment  Comments: DL with successful tube placement but cuff inflation pushed tube out. DL again with same grade 1 view, successful tube placement

## 2022-06-10 NOTE — Interval H&P Note (Signed)
All questions answered, patient wants to proceed with procedure. ? ?

## 2022-06-10 NOTE — Anesthesia Postprocedure Evaluation (Signed)
Anesthesia Post Note  Patient: Mathew Oliver  Procedure(s) Performed: SHOULDER ARTHROSCOPY WITH SUBACROMIAL DECOMPRESSION, ROTATOR CUFF REPAIR AND BICEP TENDON REPAIR (Right) SHOULDER ARTHROSCOPY WITH DISTAL CLAVICLE EXCISION (Right: Shoulder)     Patient location during evaluation: PACU Anesthesia Type: Regional and General Level of consciousness: awake and alert Pain management: pain level controlled Vital Signs Assessment: post-procedure vital signs reviewed and stable Respiratory status: spontaneous breathing, nonlabored ventilation, respiratory function stable and patient connected to nasal cannula oxygen Cardiovascular status: blood pressure returned to baseline and stable Postop Assessment: no apparent nausea or vomiting Anesthetic complications: no  No notable events documented.  Last Vitals:  Vitals:   06/10/22 1145 06/10/22 1156  BP: (!) 181/139 (!) 172/109  Pulse: 83   Resp: (!) 34   Temp:    SpO2: 99%     Last Pain:  Vitals:   06/10/22 0825  TempSrc:   PainSc: 0-No pain                 Kyasia Steuck

## 2022-06-10 NOTE — Transfer of Care (Signed)
Immediate Anesthesia Transfer of Care Note  Patient: Mathew Oliver  Procedure(s) Performed: SHOULDER ARTHROSCOPY WITH SUBACROMIAL DECOMPRESSION, ROTATOR CUFF REPAIR AND BICEP TENDON REPAIR (Right) SHOULDER ARTHROSCOPY WITH DISTAL CLAVICLE EXCISION (Right: Shoulder)  Patient Location: PACU  Anesthesia Type:General  Level of Consciousness: awake, alert , and oriented  Airway & Oxygen Therapy: Patient Spontanous Breathing and Patient connected to face mask oxygen  Post-op Assessment: Report given to RN and Post -op Vital signs reviewed and stable  Post vital signs: Reviewed and stable  Last Vitals:  Vitals Value Taken Time  BP 172/109 06/10/22 1145  Temp    Pulse 35 06/10/22 1147  Resp 32 06/10/22 1147  SpO2 92 % 06/10/22 1147  Vitals shown include unvalidated device data.  Last Pain:  Vitals:   06/10/22 0825  TempSrc:   PainSc: 0-No pain      Patients Stated Pain Goal: 3 ([id number])  Complications: No notable events documented.

## 2022-06-10 NOTE — Anesthesia Procedure Notes (Signed)
Anesthesia Regional Block: Interscalene brachial plexus block   Pre-Anesthetic Checklist: , timeout performed,  Correct Patient, Correct Site, Correct Laterality,  Correct Procedure, Correct Position, site marked,  Risks and benefits discussed,  Surgical consent,  Pre-op evaluation,  At surgeon's request and post-op pain management  Laterality: Right  Prep: chloraprep       Needles:  Injection technique: Single-shot  Needle Type: Echogenic Stimulator Needle     Needle Length: 5cm  Needle Gauge: 22     Additional Needles:   Procedures:, nerve stimulator,,, ultrasound used (permanent image in chart),,     Nerve Stimulator or Paresthesia:  Response: hand, 0.45 mA  Additional Responses:   Narrative:  Start time: 06/10/2022 8:40 AM End time: 06/10/2022 8:45 AM Injection made incrementally with aspirations every 5 mL.  Performed by: Personally  Anesthesiologist: Janeece Riggers, MD  Additional Notes: Functioning IV was confirmed and monitors were applied.  A 35mm 22ga Arrow echogenic stimulator needle was used. Sterile prep and drape,hand hygiene and sterile gloves were used. Ultrasound guidance: relevant anatomy identified, needle position confirmed, local anesthetic spread visualized around nerve(s)., vascular puncture avoided.  Image printed for medical record. Negative aspiration and negative test dose prior to incremental administration of local anesthetic. The patient tolerated the procedure well.

## 2022-06-10 NOTE — Discharge Instructions (Addendum)
Ophelia Charter MD, MPH Noemi Chapel, PA-C [facility] [address], [address] [phone] (tel)   [phone] (fax)   POST-OPERATIVE INSTRUCTIONS - SHOULDER ARTHROSCOPY  WOUND CARE You may remove the Operative Dressing on Post-Op Day #3 (72hrs after surgery).   Alternatively if you would like you can leave dressing on until follow-up if within 7-8 days but keep it dry. Leave steri-strips in place until they fall off on their own, usually 2 weeks postop. There may be a small amount of fluid/bleeding leaking at the surgical site.  This is normal; the shoulder is filled with fluid during the procedure and can leak for 24-48hrs after surgery.  You may change/reinforce the bandage as needed.  Use the Cryocuff or Ice as often as possible for the first 7 days, then as needed for pain relief. Always keep a towel, ACE wrap or other barrier between the cooling unit and your skin.  You may shower on Post-Op Day #3. Gently pat the area dry.  Do not soak the shoulder in water or submerge it.  Keep incisions as dry as possible. Do not go swimming in the pool or ocean until 4 weeks after surgery or when otherwise instructed.    EXERCISES Wear the sling at all times  You may remove the sling for showering, but keep the arm across the chest or in a secondary sling.     It is normal for your fingers/hand to become more swollen after surgery and discolored from bruising.   This will resolve over the first few weeks usually after surgery. Please continue to ambulate and do not stay sitting or lying for too long.  Perform foot and wrist pumps to assist in circulation.  PHYSICAL THERAPY - You will begin physical therapy soon after surgery (unless otherwise specified) - Please call to set up an appointment, if you do not already have one  - Let our office if there are any issues with scheduling your therapy  - You have a physical therapy appointment scheduled at [facility] (across  the hall from our office) on Monday April 1st   [place] ([place]) The anesthesia team may have performed a nerve block for you this is a great tool used to minimize pain.   The block may start wearing off overnight (between 8-24 hours postop) When the block wears off, your pain may go from nearly zero to the pain you would have had postop without the block. This is an abrupt transition but nothing dangerous is happening.   This can be a challenging period but utilize your as needed pain medications to try and manage this period. We suggest you use the pain medication the first night prior to going to bed, to ease this transition.  You may take an extra dose of narcotic when this happens if needed  POST-OP MEDICATIONS- Multimodal approach to pain control In general your pain will be controlled with a combination of substances.  Prescriptions unless otherwise discussed are electronically sent to your pharmacy.  This is a carefully made plan we use to minimize narcotic use.     Celebrex - Anti-inflammatory medication taken on a scheduled basis Acetaminophen - Non-narcotic pain medicine taken on a scheduled basis  Gabapentin - this is to help with nerve based pain, take on a scheduled basis Oxycodone - This is a strong narcotic, to be used only on an "as needed" basis for SEVERE pain. Zofran - take as needed for nausea  FOLLOW-UP If you develop a Fever (?101.5), Redness or Drainage from the surgical incision site, please call our office to arrange for an evaluation. Please call the office to schedule a follow-up appointment for your first post-operative appointment, 7-10 days post-operatively.    HELPFUL INFORMATION   You may be more comfortable sleeping in a semi-seated position the first few nights following surgery.  Keep a pillow propped under the elbow and forearm for comfort.  If you have a recliner type of chair it might be beneficial.  If not that is fine too, but  it would be helpful to sleep propped up with pillows behind your operated shoulder as well under your elbow and forearm.  This will reduce pulling on the suture lines.  When dressing, put your operative arm in the sleeve first.  When getting undressed, take your operative arm out last.  Loose fitting, button-down shirts are recommended.  Often in the first days after surgery you may be more comfortable keeping your operative arm under your shirt and not through the sleeve.  You may return to work/school in the next couple of days when you feel up to it.  Desk work and typing in the sling is fine.  We suggest you use the pain medication the first night prior to going to bed, in order to ease any pain when the anesthesia wears off. You should avoid taking pain medications on an empty stomach as it will make you nauseous.  You should wean off your narcotic medicines as soon as you are able.  Most patients will be off narcotics before their first postop appointment.   Do not drink alcoholic beverages or take illicit drugs when taking pain medications.  It is against the law to drive while taking narcotics.  In some states it is against the law to drive while your arm is in a sling.   Pain medication may make you constipated.  Below are a few solutions to try in this order: Decrease the amount of pain medication if you aren't having pain. Drink lots of decaffeinated fluids. Drink prune juice and/or eat dried prunes  If the first 3 don't work start with additional solutions Take Colace - an over-the-counter stool softener Take Senokot - an over-the-counter laxative Take Miralax - a stronger over-the-counter laxative  For more information including helpful videos and documents visit our website:   [email or web address]

## 2022-06-11 ENCOUNTER — Encounter (HOSPITAL_COMMUNITY): Payer: Self-pay | Admitting: Orthopaedic Surgery

## 2022-06-11 NOTE — Op Note (Signed)
Orthopaedic Surgery Operative Note (CSN: [id number])  Mathew Oliver  [birth year] Date of Surgery: 06/10/2022   DIAGNOSES: Right shoulder, acute on chronic rotator cuff tear, SLAP tear, biceps tendinitis, AC arthritis, and subacromial impingement.  POST-OPERATIVE DIAGNOSIS: same  PROCEDURE: Arthroscopic extensive debridement - 29823 Subdeltoid Bursa, Supraspinatus Tendon, Anterior Labrum, Superior Labrum, and Posterior Labrum Arthroscopic distal clavicle excision [id number] Arthroscopic subacromial decompression [id number] Arthroscopic rotator cuff repair [id number] Arthroscopic biceps tenodesis [id number]   OPERATIVE FINDING: Exam under anesthesia: Normal Articular space:  Anterior and posterior labral tearing Chondral surfaces: Normal Biceps:  Type II SLAP tear with intrasubstance tearing as well Subscapularis: Intact  Supraspinatus: Incomplete tear we marked with a stitch and a had a high-grade partial-thickness articular sided tear.  Once he went to the bursal side and found our stitch it was easily probed through.  It was a greater than 90% involved.  We took down this layer and performed a speed fix single anchor repair.  We have good imbrication of tissue to bone. Infraspinatus: Intact      Post-operative plan: The patient will be non-weightbearing in a sling for 6 weeks.  The patient will be discharged home.  DVT prophylaxis not indicated in ambulatory upper extremity patient without known risk factors.   Pain control with PRN pain medication preferring oral medicines.  Follow up plan will be scheduled in approximately 7 days for incision check and XR.  Surgeons:Primary: Hiram Gash, MD Assistants:Caroline McBane PA-C Location: [facility] ROOM 08 Anesthesia: General with Exparel interscalene block Antibiotics: Ancef 2 g Tourniquet time: None Estimated Blood Loss: Minimal Complications: None Specimens: None Implants: Implant Name Type Inv. Item Serial No. Manufacturer Lot No. LRB No.  Used Action  1.8 knotless fibertak soft anchor    [employer] [id number] Right 0 Implanted  1.8 knotless fibertak soft anchor    [employer] [id number] Right 0 Implanted  suture anchor biocomposite swivelock    [employer] [id number] Right 0 Implanted  ANCHOR SUT BIO SW 4.75X19.1 - [id number] Anchor ANCHOR SUT BIO SW 4.75X19.1  [employer] [id number] Right 1 Implanted  ANCHOR SUT 1.8 FIBERTAK SB KL - [id number] Anchor ANCHOR SUT 1.8 FIBERTAK SB KL  [employer] [id number] Right 1 Implanted  ANCHOR SUT 1.8 FIBERTAK SB KL - [id number] Anchor ANCHOR SUT 1.8 [employer] [id number] Right 1 Implanted    Indications for Surgery:   Mathew Oliver is a 62 y.o. male with continued shoulder pain refractory to nonoperative measures for extended period of time.    The risks and benefits were explained at length including but not limited to continued pain, cuff failure, biceps tenodesis failure, stiffness, need for further surgery and infection.   Procedure:   Patient was correctly identified in the preoperative holding area and operative site marked.  Patient brought to OR and positioned beachchair on an [place] table ensuring that all bony prominences were padded and the head was in an appropriate location.  Anesthesia was induced and the operative shoulder was prepped and draped in the usual sterile fashion.  Timeout was called preincision.  A standard posterior viewing portal was made after localizing the portal with a spinal needle.  An anterior accessory portal was also made.  After clearing the articular space the camera was positioned in the subacromial space.  Findings above.    Extensive debridement was performed of the anterior interval tissue, labral fraying and the bursa.  Glenoid bone, glenoid cartilage, humeral bone and humeral  cartilage were also debrided.  Subacromial decompression: We made a lateral portal with spinal needle guidance. We then proceeded to debride bursal tissue extensively  with a shaver and arthrocare device. At that point we continued to identify the borders of the acromion and identify the spur. We then carefully preserved the deltoid fascia and used a burr to convert the acromion to a Type 1 flat acromion without issue.  Biceps tenodesis: We marked the tendon and then performed a tenotomy and debridement of the stump in the articular space. We then identified the biceps tendon in its groove suprapec with the arthroscope in the lateral portal taking care to move from lateral to medial to avoid injury to the subscapularis. At that point we unroofed the tendon itself and mobilized it. An accessory anterior portal was made in line with the tendon and we grasped it from the anterior superior portal and worked from the accessory anterior portal. Two Fibertak 1.75mm knotless anchors were placed in the groove and the tendon was secured in a luggage loop style fashion with a pass of the limb of suture through the tendon using a scorpion device to avoid pull-through.  Repair was completed with good tension on the tendon.  Residual stump of the tendon was removed after being resected with a RF ablator.  Distal Clavicle resection:  The scope was placed in the subacromial space from the posterior portal.  A hemostat was placed through the anterior portal and we spread at the [facility] joint.  A burr was then inserted and 10 mm of distal clavicle was resected taking care to avoid damage to the capsule around the joint and avoiding overhanging bone posteriorly.    Rotator cuff repair: We identified the partial articular sided tear and marked it with a PDS suture.  We cleared the bursa and went to the bursal side and identified the stitch.  We able to easily probed through this area.  We completed the tear with a shaver and then prepared the tuberosity for healing.  At that point were able to place a fiber tape in an inverted mattress fashion and bring it over to a 4.75 bio composite swivel lock  placed 8 to 10 mm below the tuberosity obtaining good tissue to bone apposition.  Construct was stable.  The incisions were closed with absorbable monocryl and steri strips.  A sterile dressing was placed along with a sling. The patient was awoken from general anesthesia and taken to the PACU in stable condition without complication.   Noemi Chapel, PA-C, present and scrubbed throughout the case, critical for completion in a timely fashion, and for retraction, instrumentation, closure.

## 2024-02-03 IMAGING — DX DG ABD PORTABLE 1V
3 series · 3 of 3 positions shown · non-contrast
Comparison: None Available.

CLINICAL DATA: Constipation

EXAM:
PORTABLE ABDOMEN - 1 VIEW

[abdomen kub (1 of 3)]
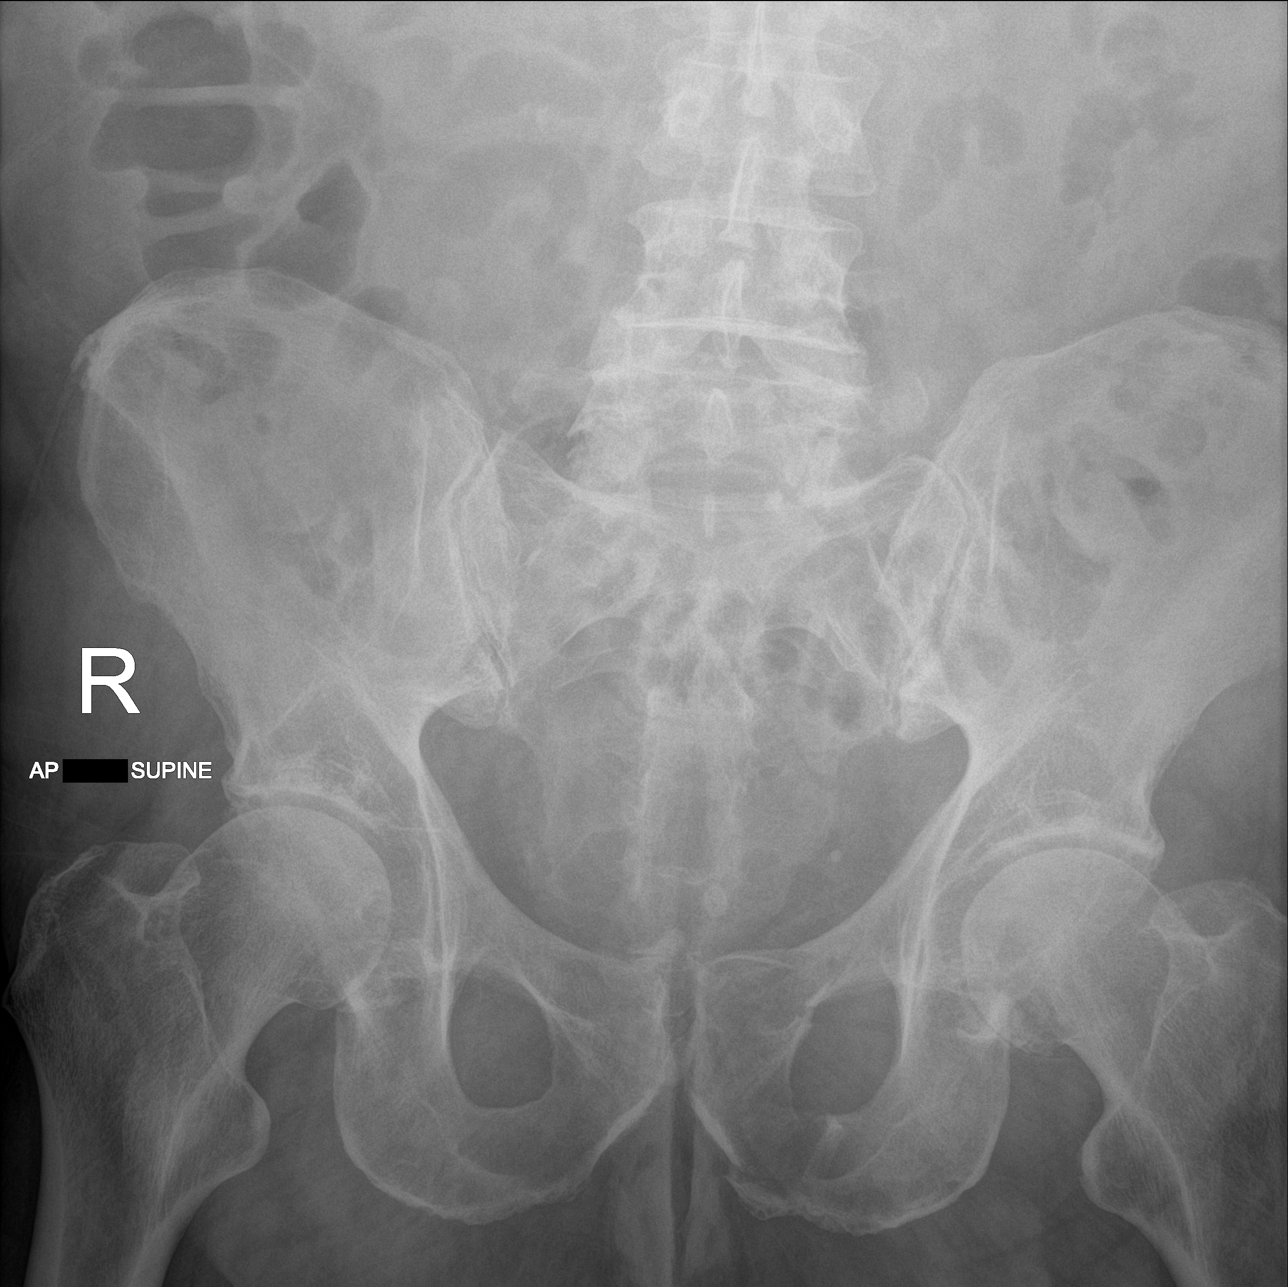

[abdomen kub (2 of 3)]
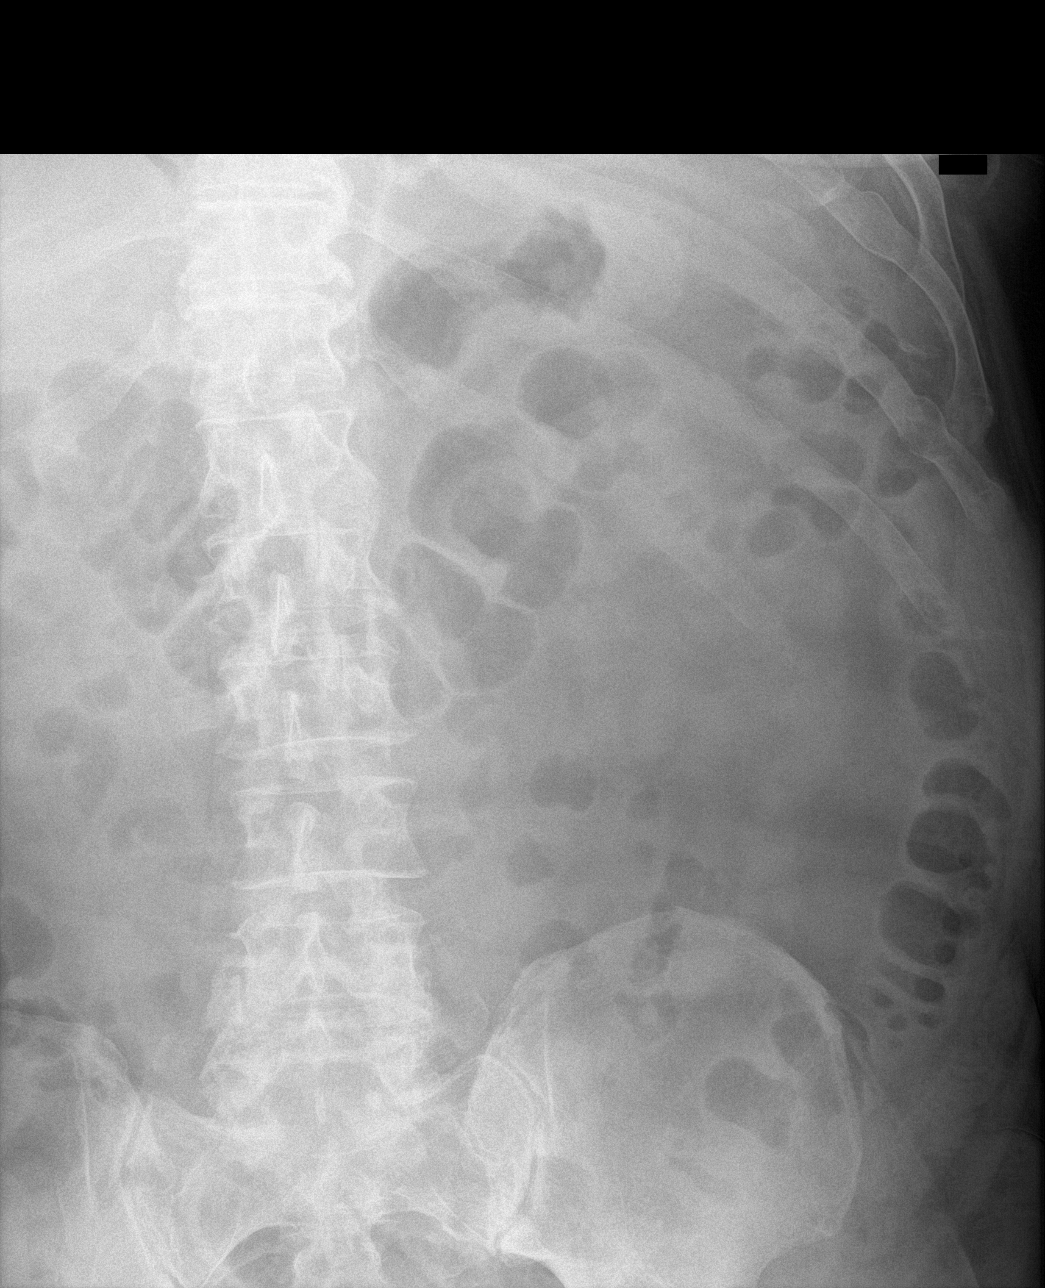

[abdomen kub (3 of 3)]
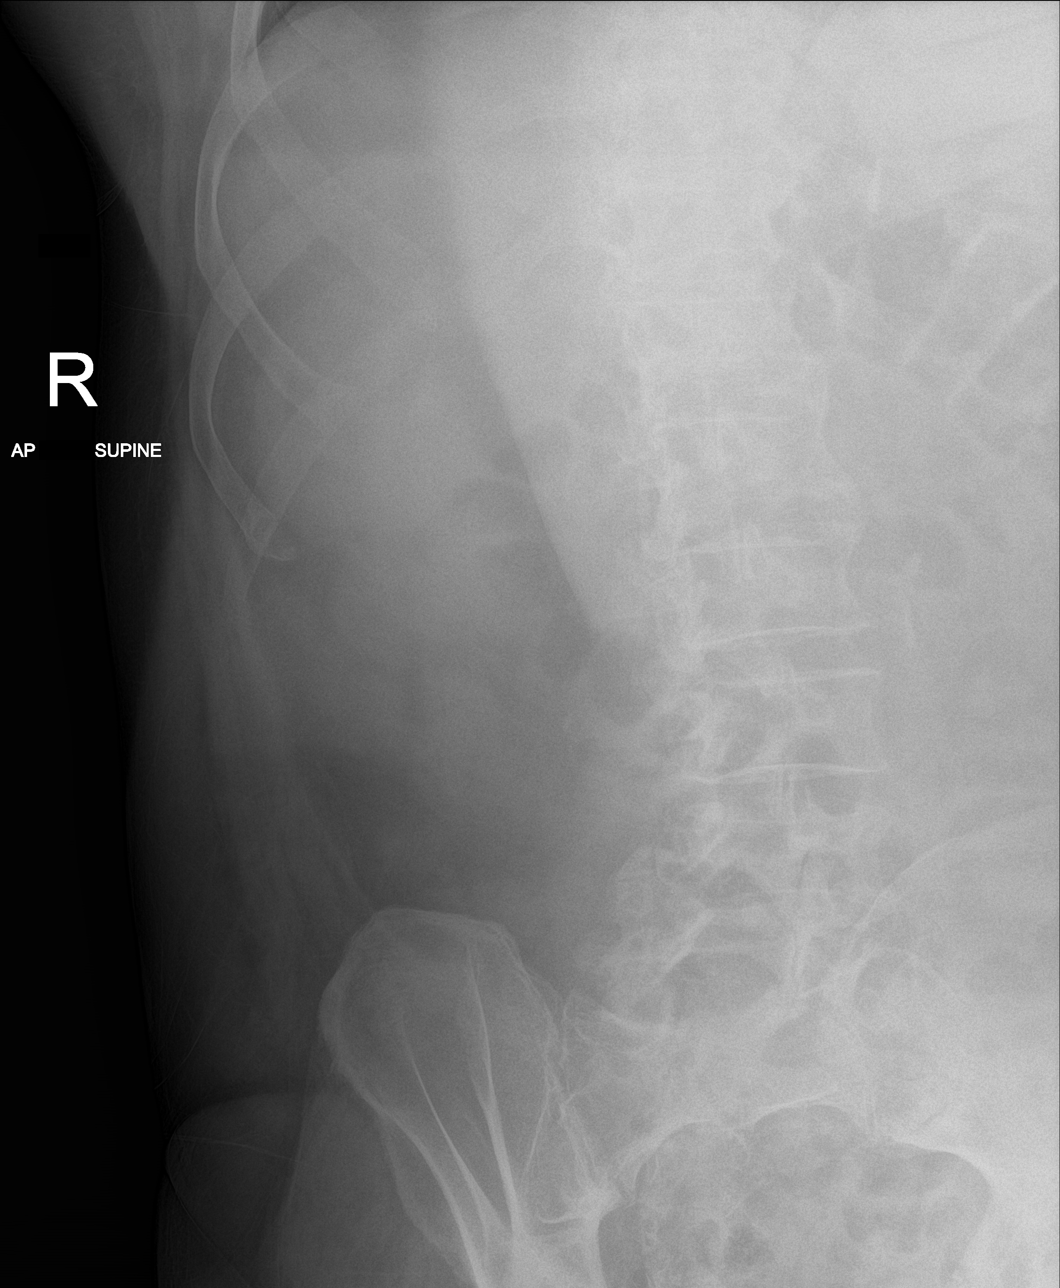

[3 of 3 positions shown; findings below may reference images not displayed]

FINDINGS: Bowel gas pattern is nonspecific. Small amount of stool is seen in
the colon. There is no fecal impaction in the rectosigmoid. No
abnormal masses or calcifications are seen. Kidneys are partly
obscured by bowel contents. Deformities are seen in the left lower
ribs and left pubic bone without break in the cortical margins
suggesting old healed fractures.
IMPRESSION: Nonspecific bowel gas pattern. Small amount of stool is seen in the
colon. There is no fecal impaction in the rectosigmoid.
